# Patient Record
Sex: Female | Born: 1940
Health system: Southern US, Community
[De-identification: ages and names within clinical notes are randomized; demographics above are authoritative.]

## PROBLEM LIST (undated history)

## (undated) DIAGNOSIS — I493 Ventricular premature depolarization: Secondary | ICD-10-CM

## (undated) DIAGNOSIS — R519 Headache, unspecified: Secondary | ICD-10-CM

## (undated) DIAGNOSIS — D649 Anemia, unspecified: Secondary | ICD-10-CM

## (undated) DIAGNOSIS — R112 Nausea with vomiting, unspecified: Secondary | ICD-10-CM

## (undated) DIAGNOSIS — Z8489 Family history of other specified conditions: Secondary | ICD-10-CM

## (undated) DIAGNOSIS — M1990 Unspecified osteoarthritis, unspecified site: Secondary | ICD-10-CM

## (undated) DIAGNOSIS — E785 Hyperlipidemia, unspecified: Secondary | ICD-10-CM

## (undated) DIAGNOSIS — K219 Gastro-esophageal reflux disease without esophagitis: Secondary | ICD-10-CM

## (undated) DIAGNOSIS — G709 Myoneural disorder, unspecified: Secondary | ICD-10-CM

## (undated) DIAGNOSIS — Z5189 Encounter for other specified aftercare: Secondary | ICD-10-CM

## (undated) DIAGNOSIS — E039 Hypothyroidism, unspecified: Secondary | ICD-10-CM

## (undated) DIAGNOSIS — N189 Chronic kidney disease, unspecified: Secondary | ICD-10-CM

## (undated) DIAGNOSIS — H269 Unspecified cataract: Secondary | ICD-10-CM

## (undated) DIAGNOSIS — L4050 Arthropathic psoriasis, unspecified: Secondary | ICD-10-CM

## (undated) DIAGNOSIS — R51 Headache: Secondary | ICD-10-CM

## (undated) DIAGNOSIS — J189 Pneumonia, unspecified organism: Secondary | ICD-10-CM

## (undated) DIAGNOSIS — K635 Polyp of colon: Secondary | ICD-10-CM

## (undated) DIAGNOSIS — E279 Disorder of adrenal gland, unspecified: Secondary | ICD-10-CM

## (undated) DIAGNOSIS — Z8669 Personal history of other diseases of the nervous system and sense organs: Secondary | ICD-10-CM

## (undated) DIAGNOSIS — Z9889 Other specified postprocedural states: Secondary | ICD-10-CM

## (undated) DIAGNOSIS — I499 Cardiac arrhythmia, unspecified: Secondary | ICD-10-CM

## (undated) DIAGNOSIS — K317 Polyp of stomach and duodenum: Secondary | ICD-10-CM

## (undated) HISTORY — PX: ABDOMINAL HYSTERECTOMY: SHX81

## (undated) HISTORY — DX: Polyp of stomach and duodenum: K31.7

## (undated) HISTORY — DX: Chronic kidney disease, unspecified: N18.9

## (undated) HISTORY — PX: FINGER SURGERY: SHX640

## (undated) HISTORY — PX: CATARACT EXTRACTION, BILATERAL: SHX1313

## (undated) HISTORY — DX: Hypothyroidism, unspecified: E03.9

## (undated) HISTORY — PX: CARDIAC CATHETERIZATION: SHX172

## (undated) HISTORY — DX: Hyperlipidemia, unspecified: E78.5

## (undated) HISTORY — DX: Encounter for other specified aftercare: Z51.89

## (undated) HISTORY — PX: CHOLECYSTECTOMY: SHX55

## (undated) HISTORY — DX: Unspecified osteoarthritis, unspecified site: M19.90

## (undated) HISTORY — DX: Gastro-esophageal reflux disease without esophagitis: K21.9

## (undated) HISTORY — DX: Arthropathic psoriasis, unspecified: L40.50

## (undated) HISTORY — PX: RHINOPLASTY: SUR1284

## (undated) HISTORY — PX: INCONTINENCE SURGERY: SHX676

## (undated) HISTORY — PX: TONSILLECTOMY: SUR1361

## (undated) HISTORY — DX: Polyp of colon: K63.5

## (undated) HISTORY — PX: RECTOCELE REPAIR: SHX761

## (undated) HISTORY — PX: APPENDECTOMY: SHX54

## (undated) HISTORY — DX: Disorder of adrenal gland, unspecified: E27.9

## (undated) HISTORY — DX: Unspecified cataract: H26.9

## (undated) HISTORY — DX: Ventricular premature depolarization: I49.3

## (undated) HISTORY — PX: COLONOSCOPY: SHX174

## (undated) HISTORY — DX: Myoneural disorder, unspecified: G70.9

## (undated) HISTORY — DX: Personal history of other diseases of the nervous system and sense organs: Z86.69

---

## 1976-05-24 DIAGNOSIS — Z5189 Encounter for other specified aftercare: Secondary | ICD-10-CM

## 1976-05-24 HISTORY — DX: Encounter for other specified aftercare: Z51.89

## 2001-05-23 ENCOUNTER — Other Ambulatory Visit: Admission: RE | Admit: 2001-05-23 | Discharge: 2001-05-23 | Payer: Self-pay | Admitting: Internal Medicine

## 2001-05-29 ENCOUNTER — Encounter: Payer: Self-pay | Admitting: Internal Medicine

## 2001-05-29 ENCOUNTER — Encounter: Admission: RE | Admit: 2001-05-29 | Discharge: 2001-05-29 | Payer: Self-pay | Admitting: Internal Medicine

## 2002-02-12 ENCOUNTER — Emergency Department (HOSPITAL_COMMUNITY): Admission: EM | Admit: 2002-02-12 | Discharge: 2002-02-13 | Payer: Self-pay | Admitting: *Deleted

## 2002-02-13 ENCOUNTER — Encounter: Payer: Self-pay | Admitting: Emergency Medicine

## 2002-04-03 ENCOUNTER — Encounter: Admission: RE | Admit: 2002-04-03 | Discharge: 2002-04-03 | Payer: Self-pay | Admitting: *Deleted

## 2002-04-03 ENCOUNTER — Encounter: Payer: Self-pay | Admitting: *Deleted

## 2002-08-09 ENCOUNTER — Other Ambulatory Visit: Admission: RE | Admit: 2002-08-09 | Discharge: 2002-08-09 | Payer: Self-pay | Admitting: Obstetrics and Gynecology

## 2003-04-25 ENCOUNTER — Encounter (INDEPENDENT_AMBULATORY_CARE_PROVIDER_SITE_OTHER): Payer: Self-pay | Admitting: *Deleted

## 2003-04-25 ENCOUNTER — Ambulatory Visit (HOSPITAL_COMMUNITY): Admission: RE | Admit: 2003-04-25 | Discharge: 2003-04-25 | Payer: Self-pay | Admitting: Internal Medicine

## 2003-08-20 ENCOUNTER — Other Ambulatory Visit: Admission: RE | Admit: 2003-08-20 | Discharge: 2003-08-20 | Payer: Self-pay | Admitting: Obstetrics and Gynecology

## 2003-12-13 ENCOUNTER — Inpatient Hospital Stay (HOSPITAL_COMMUNITY): Admission: RE | Admit: 2003-12-13 | Discharge: 2003-12-14 | Payer: Self-pay | Admitting: Obstetrics and Gynecology

## 2004-06-03 ENCOUNTER — Ambulatory Visit: Payer: Self-pay | Admitting: Internal Medicine

## 2004-06-09 ENCOUNTER — Ambulatory Visit: Payer: Self-pay | Admitting: Internal Medicine

## 2004-09-11 ENCOUNTER — Other Ambulatory Visit: Admission: RE | Admit: 2004-09-11 | Discharge: 2004-09-11 | Payer: Self-pay | Admitting: Obstetrics and Gynecology

## 2005-09-02 ENCOUNTER — Ambulatory Visit (HOSPITAL_BASED_OUTPATIENT_CLINIC_OR_DEPARTMENT_OTHER): Admission: RE | Admit: 2005-09-02 | Discharge: 2005-09-02 | Payer: Self-pay | Admitting: Orthopedic Surgery

## 2005-09-02 ENCOUNTER — Encounter (INDEPENDENT_AMBULATORY_CARE_PROVIDER_SITE_OTHER): Payer: Self-pay | Admitting: Specialist

## 2005-12-01 ENCOUNTER — Encounter: Payer: Self-pay | Admitting: Gastroenterology

## 2006-07-22 ENCOUNTER — Emergency Department (HOSPITAL_COMMUNITY): Admission: EM | Admit: 2006-07-22 | Discharge: 2006-07-23 | Payer: Self-pay | Admitting: Emergency Medicine

## 2007-09-06 ENCOUNTER — Ambulatory Visit (HOSPITAL_COMMUNITY): Admission: RE | Admit: 2007-09-06 | Discharge: 2007-09-06 | Payer: Self-pay | Admitting: Family Medicine

## 2007-09-06 ENCOUNTER — Encounter (INDEPENDENT_AMBULATORY_CARE_PROVIDER_SITE_OTHER): Payer: Self-pay | Admitting: *Deleted

## 2007-10-25 ENCOUNTER — Ambulatory Visit (HOSPITAL_COMMUNITY): Admission: RE | Admit: 2007-10-25 | Discharge: 2007-10-25 | Payer: Self-pay | Admitting: Gastroenterology

## 2007-10-25 ENCOUNTER — Encounter (INDEPENDENT_AMBULATORY_CARE_PROVIDER_SITE_OTHER): Payer: Self-pay | Admitting: *Deleted

## 2007-10-25 ENCOUNTER — Encounter: Payer: Self-pay | Admitting: Gastroenterology

## 2008-01-23 ENCOUNTER — Encounter: Payer: Self-pay | Admitting: Gastroenterology

## 2008-04-25 ENCOUNTER — Encounter: Payer: Self-pay | Admitting: Gastroenterology

## 2008-07-02 ENCOUNTER — Ambulatory Visit: Payer: Self-pay | Admitting: Psychology

## 2009-03-19 ENCOUNTER — Ambulatory Visit: Payer: Self-pay | Admitting: Gastroenterology

## 2009-04-29 ENCOUNTER — Ambulatory Visit: Payer: Self-pay | Admitting: Gastroenterology

## 2009-05-05 ENCOUNTER — Encounter: Payer: Self-pay | Admitting: Gastroenterology

## 2009-05-31 ENCOUNTER — Encounter: Payer: Self-pay | Admitting: Gastroenterology

## 2009-06-10 ENCOUNTER — Ambulatory Visit: Payer: Self-pay | Admitting: Gastroenterology

## 2009-06-10 DIAGNOSIS — K3189 Other diseases of stomach and duodenum: Secondary | ICD-10-CM | POA: Insufficient documentation

## 2009-06-10 DIAGNOSIS — R1013 Epigastric pain: Secondary | ICD-10-CM

## 2009-06-13 ENCOUNTER — Ambulatory Visit: Payer: Self-pay | Admitting: Gastroenterology

## 2009-06-20 ENCOUNTER — Encounter: Payer: Self-pay | Admitting: Gastroenterology

## 2010-06-23 NOTE — Procedures (Signed)
Summary: [facility]   Imported By: Lester [place] 05/28/2009 11:03:13  _____________________________________________________________________  External Attachment:    Type:   Image     Comment:   External Document

## 2010-06-23 NOTE — Letter (Signed)
Summary: EGD Instructions  [facility]  [address] [place], [place] [address]   Phone: [phone]  Fax: [phone]       LYNNITA SOMMA    Jul 13, 1940    MRN: [id number]       Procedure Day /Date:06/13/09     Arrival Time: 3 pm     Procedure Time:4 pm     Location of Procedure:                    [facility] (4th Floor)    PREPARATION FOR ENDOSCOPY   On1/21/11 THE DAY OF THE PROCEDURE:  1.   No solid foods, milk or milk products are allowed after midnight the night before your procedure.  2.   Do not drink anything colored red or purple.  Avoid juices with pulp.  No orange juice.  3.  You may drink clear liquids until 2 pm, which is 2 hours before your procedure.                                                                                                CLEAR LIQUIDS INCLUDE: Water Jello Ice Popsicles Tea (sugar ok, no milk/cream) Powdered fruit flavored drinks Coffee (sugar ok, no milk/cream) Gatorade Juice: apple, white grape, white cranberry  Lemonade Clear bullion, consomm, broth Carbonated beverages (any kind) Strained chicken noodle soup Hard Candy   MEDICATION INSTRUCTIONS  Unless otherwise instructed, you should take regular prescription medications with a small sip of water as early as possible the morning of your procedure.               OTHER INSTRUCTIONS  You will need a responsible adult at least 68 years of age to accompany you and drive you home.   This person must remain in the waiting room during your procedure.  Wear loose fitting clothing that is easily removed.  Leave jewelry and other valuables at home.  However, you may wish to bring a book to read or an iPod/MP3 player to listen to music as you wait for your procedure to start.  Remove all body piercing jewelry and leave at home.  Total time from sign-in until discharge is approximately 2-3 hours.  You should go home directly after your  procedure and rest.  You can resume normal activities the day after your procedure.  The day of your procedure you should not:   Drive   Make legal decisions   Operate machinery   Drink alcohol   Return to work  You will receive specific instructions about eating, activities and medications before you leave.    The above instructions have been reviewed and explained to me by   _______________________    I fully understand and can verbalize these instructions _____________________________ Date _________

## 2010-06-23 NOTE — Letter (Signed)
Summary: [facility]   Imported By: Lester [place] 05/28/2009 11:04:45  _____________________________________________________________________  External Attachment:    Type:   Image     Comment:   External Document

## 2010-06-23 NOTE — Assessment & Plan Note (Signed)
Review of gastrointestinal problems: 1. GERD; she has been seen by doctors [place] and [place] and had several EGDs. EGD/path report from 01/2008 EGD with Dr. Dion Body (was referred by Dr. Charna Elizabeth to see him).  Normal esophagus, several (fundic gland on path) gastric polyps, normal duodenum. also MBS study from 05/2008 at [facility]; normal 2. history of polyps,  was told by Dr. Loreta Ave to have repeat colonoscopy around now (2010) however previous colonoscopies have been requested not received by Dr. Kenna Gilbert office; colonoscopy report from Dr. Kenna Gilbert office: 11/2005 small polyp removed, was HP on pathology.   she needs repeat colonoscopy at 10 year interval, that would be 11/2010 (5 year interval for FH of colon cancer). 3. FH of colon cancer, mother   History of Present Illness Visit Type: follow up  Primary GI MD: Rob Bunting MD Primary Provider: Merri Brunette, MD Requesting Provider: n/a Chief Complaint: F/u visit  History of Present Illness:      very pleasant 70 year old woman who since her last visit, her insurance would not pay for nexium; spoke with pharmacist and she tried prilosec OTC (taking it once a day) and this helped very well.    She switched the prilosec for nexium when her prescription was ready (caused belching, bloating, heaviness in abd).  Would wake up with headaches.  She stuck with the nexium for another 2 weeks, but finally stopped it 3-4 days ago.  She felt better after stopping the nexium PM dose.    still waking up with upper back pains, that she really feels are related to GERD.             Current Medications (verified): 1)  Maxzide 75-50 Mg Tabs (Triamterene-Hctz) .Marland Kitchen.. 1 By Mouth Once Daily As Needed 2)  Fish Oil 1000 Mg Caps (Omega-3 Fatty Acids) .... As Directed 3)  Climara 0.1 Mg/24hr Ptwk (Estradiol) .Marland Kitchen.. 1 Patch Weekly To Clean Dry Skin 4)  Vitamin D 1000 Unit Tabs (Cholecalciferol) .Marland Kitchen.. 1 By Mouth Once Daily 5)  Magnesium 250 Mg Tabs (Magnesium)  .... Take One Tablet By Mouth Two Times A Day 6)  Icaps Mv  Tabs (Multiple Vitamins-Minerals) .... As Directed 7)  Crestor 5 Mg Tabs (Rosuvastatin Calcium) .Marland Kitchen.. 1 By Mouth Once Daily 8)  Synthroid 50 Mcg Tabs (Levothyroxine Sodium) .Marland Kitchen.. 1 By Mouth Every Morning On A Empty Stomach 9)  Butalbital-Apap-Caff-Cod 50-325-40-30 Mg Caps (Butalbital-Apap-Caff-Cod) .Marland Kitchen.. 1 Cap As Needed Every 6-8 Hours As Needed Ha 10)  Nexium 20 Mg Cpdr (Esomeprazole Magnesium) .... Take One Pill Twice Daily (20-30 Min Before Bf and Dinner)  Allergies (verified): No Known Drug Allergies  Vital Signs:  Patient profile:   70 year old female Height:      65.5 inches Weight:      134 pounds BMI:     22.04 BSA:     1.68 Pulse rate:   62 / minute Pulse rhythm:   regular BP sitting:   108 / 74  (left arm) Cuff size:   regular  Vitals Entered By: Ok Anis CMA (June 10, 2009 3:35 PM)  Physical Exam  Additional Exam:  Constitutional: generally well appearing Psychiatric: alert and oriented times 3 Abdomen: soft, non-tender, non-distended, normal bowel sounds    Impression & Recommendations:  Problem # 1:  Dyspepsia, family history of esophagus cancer she is admittedly very nervous that her dyspeptic symptoms are related to esophagus cancer. I think that is unlikely however she is somewhat fixated on this and I do think  that if we could reassure her that it is not case that her dyspeptic symptoms may be either completely alleviated or at least easier to tolerate for her. We will therefore set her up for EGD at her soonest convenience. She may try to taper off Nexium to as needed or at least once every other day following that procedure.  Patient Instructions: 1)  You will be scheduled to have an upper endoscopy. 2)  Continue nexium once daily for now. 3)  A copy of this information will be sent to Dr. Merri Brunette. 4)  The medication list was reviewed and reconciled.  All changed / newly prescribed  medications were explained.  A complete medication list was provided to the patient / caregiver.  Appended Document: Orders Update/EGD    Clinical Lists Changes  Problems: Added new problem of DYSPEPSIA (ICD-536.8) Orders: Added new Test order of EGD (EGD) - Signed

## 2010-06-23 NOTE — Procedures (Signed)
Summary: Upper Endoscopy  Patient: [name] Note: All result statuses are Final unless otherwise noted.  Tests: (1) Upper Endoscopy (EGD)   EGD Upper Endoscopy       DONE     [facility]     [address]     [place], [place]  [address]           ENDOSCOPY PROCEDURE REPORT           PATIENT:  [name], [name]  MR#:  [id number]     BIRTHDATE:  04/20/1941, 68 yrs. old  GENDER:  female           ENDOSCOPIST:  Rachael Fee, MD           PROCEDURE DATE:  06/13/2009     PROCEDURE:  EGD with biopsy     ASA CLASS:  Class II     INDICATIONS:  dyspepsia, family history of UGI cancer           MEDICATIONS:  Fentanyl 50 mcg IV, Versed 7 mg IV     TOPICAL ANESTHETIC:  Exactacain Spray           DESCRIPTION OF PROCEDURE:   After the risks benefits and     alternatives of the procedure were thoroughly explained, informed     consent was obtained.  The LB GIF-H180 [id number] endoscope was     introduced through the mouth and advanced to the second portion of     the duodenum, without limitations.  The instrument was slowly     withdrawn as the mucosa was fully examined.     <<PROCEDUREIMAGES>>           There was mild, non-specific pangastritis. Biopsies were taken to     look for H. pylori (see image5 and image6).  Otherwise the     examination was normal (see image3, image4, image7, and image8).     Retroflexed views revealed no abnormalities.    The scope was then     withdrawn from the patient and the procedure completed.           COMPLICATIONS:  None           ENDOSCOPIC IMPRESSION:     1) Mild, non-specific gastritis;  biopsies taken to check for H.     pylori     2) Otherwise normal examination; no hiatal hernia, no     esophagitis, no ulcers           RECOMMENDATIONS:     If biopsies show H. pylori, she will be started on the     appropriate antibiotics.           ______________________________     Rachael Fee, MD           cc: Cheryl Brunette, MD       n.     [name]:   Rachael Fee at 06/13/2009 04:04 PM           [name], [name], [id number]  Note: An exclamation mark (!) indicates a result that was not dispersed into the flowsheet. Document Creation Date: 06/13/2009 4:05 PM _______________________________________________________________________  (1) Order result status: Final Collection or observation date-time: 06/13/2009 15:56 Requested date-time:  Receipt date-time:  Reported date-time:  Referring Physician:   Ordering Physician: Rob Bunting [id number]) Specimen Source:  Source: Launa Grill Order Number: [id number] Lab site:

## 2010-06-23 NOTE — Letter (Signed)
Summary: Results Letter  [facility]  [address] [place], [place] [address]   Phone: [phone]  Fax: [phone]        June 20, 2009 MRN: [id number]    [facility] [address] [place], [place]  [address]    Dear Ms. Willcox,   The biopsies taken during your recent procedure upper endoscopy show no sign of infection, inflammation or cancer.  You should continue to follow the recommnedations that we discussed at the time of your procedure.  Please feel free to call if you have any further questions or concerns.       Sincerely,  Rachael Fee MD  This letter has been electronically signed by your physician.  Appended Document: Results Letter Letter mailed 1.31.11

## 2010-06-23 NOTE — Procedures (Signed)
Summary: Colon/[facility]  Colon/[facility]   Imported By: Lester [place] 05/28/2009 11:00:43  _____________________________________________________________________  External Attachment:    Type:   Image     Comment:   External Document

## 2010-10-09 NOTE — Op Note (Signed)
NAME:  [name], [name]                         ACCOUNT NO.:  [id number]   MEDICAL RECORD NO.:  [id number]                   PATIENT TYPE:  AMB   LOCATION:  DAY                                  FACILITY:  [facility]   PHYSICIAN:  Michelle L. Vincente Poli, M.D.            DATE OF BIRTH:  [birth year]   DATE OF PROCEDURE:  12/12/2003  DATE OF DISCHARGE:                                 OPERATIVE REPORT   PREOPERATIVE DIAGNOSES:  1. Urinary incontinence.  2. Cystocele and rectocele.   POSTOPERATIVE DIAGNOSES:  1. Urinary incontinence.  2. Cystocele and rectocele.   PROCEDURE:  Anterior and posterior repair.   SURGEON:  Dr. Vincente Poli   ANESTHESIA:  General.   ESTIMATED BLOOD LOSS:  Less than 50 mL.   DESCRIPTION OF PROCEDURE:  The patient was initially taken to the operating  room by Dr. Patsi Sears.  She was intubated, and Dr. Patsi Sears proceeded  with his obturator sling, and that portion of the surgery will be dictated  by him.  At the conclusion of the cystoscopy and placement of the obturator  sling, I then scrubbed in.  The patient was already prepped and draped, and  a Foley catheter was already in the bladder when I scrubbed in.  On exam,  the patient was noted to have a grade 2 cystocele and a grade 2-3 rectocele.  A weighted speculum was already in the vagina.  Allis clamps were placed at  the vaginal apex on either side, and a transverse incision was made with a  scalpel.  The overlying vaginal epithelium was then dissected free from the  vesicovaginal fascia, and a midline incision was made in the anterior  vaginal wall beneath the cystocele.  Cystocele was reduced using 0 Vicryl  pursestring sutures x 2.  The redundant vaginal epithelium was then trimmed,  and the vaginal epithelium was closed anteriorly in a continuous running  lock stitch using 2-0 Vicryl suture.  We then turned our attention  posteriorly to the rectocele.  The weighted speculum was removed, and the  rectocele was  inspected.  She actually had very good support of her vaginal  vault, and there was no enterocele on exam.  We then placed Allis clamps at  5 and 7o'clock, and a V-shaped incision was then made at the perineum with a  scalpel.  The vaginal epithelium was dissected free using Struhle scissors  from the rectovaginal fascia.  A midline incision was made from the  vestibule almost all the way up to the vaginal vault posteriorly.  We then  reduced the rectocele using sharp and blunt dissection, and the rectocele  was then closed using interrupted 0 Vicryl suture.  We then trimmed the  redundant vaginal epithelium tissue and then closed that using continuous  running lock stitch using 2-0 Vicryl suture.  At the end of the procedure,  there was no vaginal bleeding noted.  After adequate hemostasis was  ensured,  we then placed the vaginal packing  with Estrace cream into the vagina.  All sponge, lap, and instrument counts  were correct x 2.  The patient went to recovery room in stable condition.  Of note, the patient will be observed overnight on the floor and if stable,  will go home the following day.                                               Michelle L. Vincente Poli, M.D.    Florestine Avers  D:  12/12/2003  T:  12/12/2003  Job:  [id number]

## 2010-10-09 NOTE — H&P (Signed)
NAME:  [name], [name]                         ACCOUNT NO.:  [id number]   MEDICAL RECORD NO.:  [id number]                   PATIENT TYPE:  INP   LOCATION:  0463                                 FACILITY:  [facility]   PHYSICIAN:  Michelle L. Vincente Poli, M.D.            DATE OF BIRTH:  05/21/1941   DATE OF ADMISSION:  12/12/2003  DATE OF DISCHARGE:  12/14/2003                                HISTORY & PHYSICAL   HISTORY OF PRESENT ILLNESS:  The patient is a 70 year old female who is  admitted to undergo surgical repair of cystocele and rectocele. The patient  also has genuine urinary incontinence and Dr. Patsi Sears will be performing  a transobturator pelvic sling.  The patient complains of urinary  incontinence. She has also has a chronic problem with constipation. She also  complains of some pelvic pressure and some discomfort with intercourse.   PAST MEDICAL HISTORY:  Significant for a history of fibromyalgia, arthritis,  and hypothyroidism.   MEDICATIONS:  1. Synthroid 0.05 mg daily.  2. Vivelle-Dot patch 0.1 mg daily.  3. Transderm Fiorinal as needed for migraines.  4. Sudafed as needed for migraines.   ALLERGIES:  IMITREX and VIOXX.   PAST SURGICAL HISTORY:  Laparoscopic cholecystectomy in 1991; in 1978 she  had a hysterectomy and appendectomy; in 1961 rhinoplasty; in 1949  tonsillectomy and adenoidectomy; cystoscopy times two; proctoscopy and  sigmoidoscopy three years ago;  had LASIK surgery times two on the right eye  and one time on the left eye.   PHYSICAL EXAMINATION:  VITAL SIGNS: On admission, temperature 98.7, heart  rate 60, respirations 15, blood pressure 100/70. She weighs 132 pounds.  LUNGS: Clear to auscultation bilaterally.  CARDIAC: Regular rate and rhythm.  BREASTS: Soft, nontender with no masses.  PELVIC EXAM: She has a grade 3 cystocele and a grade 2-3 rectocele. She has  good vaginal vault support. She has no pelvic masses.   Labs on admission are significant  for a white count of 4.3, hemoglobin 15.4,  platelet count 182,000.   IMPRESSION:  Symptomatic cystocele and rectocele and genuine stress urinary  incontinence.   PLAN:  The patient is admitted and will undergo anterior and posterior  repair with pubovaginal sling.                                               Michelle L. Vincente Poli, M.D.    Florestine Avers  D:  01/15/2004  T:  01/15/2004  Job:  [id number]

## 2010-10-09 NOTE — Op Note (Signed)
NAME:  [name], [name]                         ACCOUNT NO.:  [id number]   MEDICAL RECORD NO.:  [id number]                   PATIENT TYPE:  OBV   LOCATION:  0098                                 FACILITY:  [facility]   PHYSICIAN:  Sigmund I. Patsi Sears, M.D.         DATE OF BIRTH:  September 08, 1940   DATE OF PROCEDURE:  12/12/2003  DATE OF DISCHARGE:                                 OPERATIVE REPORT   PREOPERATIVE DIAGNOSIS:  Urinary incontinence.   POSTOPERATIVE DIAGNOSIS:  Urinary incontinence.   OPERATION:  Transvaginal obturator pubovaginal sling.   SURGEON:  Sigmund I. Patsi Sears, M.D.   ANESTHESIA:  General LMA.   PREPARATION:  After appropriate preanesthesia, the patient was brought to  the operating room, placed upon the operating table in the dorsal supine  position where a general LMA anesthesia was introduced.  She was then  replaced in the Nazlini stirrups, in the dorsal lithotomy position, and the  pubis was prepped with Betadine solution and draped in the usual fashion.   PROCEDURE:  The periurethral space was injected with 10 mL of Marcaine 0.25  plain.  A 1.5 cm incision was then made directly over the urethra,  subcutaneous tissue was dissected in the periurethral sulcus bilaterally.  Care was taken to avoid any injury to the urethra.  A mark was placed 5 cm  lateral to the clitoris with a blue marking pen, and stab wounds were made  bilaterally.  Using the Mentor flat passer, the Mentor transobturator  pubovaginal sling tape was passed, tensioned appropriately.  Cystoscopy  revealed no evidence of any bladder trauma.  Foley catheter was replaced.  The wound was closed in two layers with 3-0 Vicryl suture, both interrupted  and running sutures.  The perivaginal incisions were closed with Dermabond.  The patient tolerated the procedure well.  She is to undergo posterior and  anterior repair per Dr. Vincente Poli.                                               Sigmund I. Patsi Sears,  M.D.    SIT/MEDQ  D:  12/12/2003  T:  12/12/2003  Job:  [id number]

## 2010-10-09 NOTE — Op Note (Signed)
[name], [name]               ACCOUNT NO.:  [id number]   MEDICAL RECORD NO.:  [id number]          PATIENT TYPE:  AMB   LOCATION:  DSC                          FACILITY:  [facility]   PHYSICIAN:  Cindee Salt, M.D.       DATE OF BIRTH:  09-08-1940   DATE OF PROCEDURE:  09/02/2005  DATE OF DISCHARGE:                                 OPERATIVE REPORT   PREOPERATIVE DIAGNOSIS:  Mass proximal interphalangeal joint, right middle  finger.   POSTOPERATIVE DIAGNOSIS:  Mass proximal interphalangeal joint, right middle  finger.   OPERATION:  Excision mass, debridement of proximal interphalangeal joint,  right middle finger.   SURGEON:  Cindee Salt, M.D.   ANESTHESIA:  Forearm based IV regional.   HISTORY:  The patient is a 70 year old female with a history of a large mass  just distal to the PIP joint right middle finger dorsal radial aspect.   PROCEDURE:  The patient is brought to the operating room where a forearm  based IV regional anesthetic was carried out without difficulty.  She was  prepped using DuraPrep, supine position, right arm free.  An oblique  incision from midlateral line to the dorsal midportion of the middle phalanx  was made, carried down through subcutaneous tissue.  A large cystic  structure was immediately apparent.  With blunt and sharp dissection this  was dissected free, found arise from the PIP joint.  The retinacular fibers  were incised just volar to the lateral band.  This allowed opening of the  PIP joint.  The stalk was followed into the PIP joint and debridement was  performed.  The cyst was sent to pathology and no further lesions were  identified.  The wound was copiously irrigated with saline.  The skin was  then closed with interrupted 5-0 nylon sutures.  A compressive dressing and  splint to the finger applied.  The patient tolerated the procedure well and  was taken to the recovery for observation in satisfactory condition.  She is  discharged home to  return to the [facility] in one week on  Vicodin.           ______________________________  Cindee Salt, M.D.     GK/MEDQ  D:  09/02/2005  T:  09/02/2005  Job:  [id number]

## 2011-09-10 ENCOUNTER — Telehealth: Payer: Self-pay | Admitting: Gastroenterology

## 2011-09-10 NOTE — Telephone Encounter (Signed)
lmom for pt to call back. I have sent for her chart because we show the recall in 2017, but on path report Dr Loreta Ave wrote a 5 year recall that would have been last year.

## 2011-09-13 NOTE — Telephone Encounter (Signed)
Thanks, Britta Mccreedy checked and chart has never been sent; I'll get it to you Informed pt we will call her back. She prefers procedure be done week of 11/12/11 to 11/19/11 if possible; she will go on Medicare after 11/19/11.

## 2011-09-13 NOTE — Telephone Encounter (Signed)
I reviewed her colonoscopy and the pathologic report. She had a hyperplastic polyp removed in 2007 during colonoscopy. Can you ask her about her family history, I think her mother may have had colon cancer and if that is true then she should undergo colonoscopy in the next few months.

## 2011-09-13 NOTE — Telephone Encounter (Signed)
i will need to see her 2007 colonoscopy report as well as path report from that procedure before deciding on proper surveillance interval.  Can you put those on my desk and? t hanks

## 2011-09-13 NOTE — Telephone Encounter (Signed)
Dr Christella Hartigan, I am still waiting on pt's chart, but per Dr Kenna Gilbert note on 2007, pt needed 5 year f/u COLON in 2012. She states she received a letter, but never got around to it; I don't see the letter in [facility] either. Anyway, do you want to see the chart or is it ok to schedule a Direct COLON? Thanks.

## 2011-09-14 NOTE — Telephone Encounter (Signed)
Yes, if her mother had colon cancer then she should have colonoscopy every 5 years (would be due now). thanks

## 2011-09-14 NOTE — Telephone Encounter (Signed)
Scheduled pt for procedure on 11/16/11 and PV on 11/12/11; mailed pt a copy of schedule after speaking with her.

## 2011-09-14 NOTE — Telephone Encounter (Signed)
Spoke with pt and she states her mother had Colon Cancer post Breast Cancer. She wants you to know she isn't pushing for a COLON, her PCP is wanting her to check on this. OK to schedule? Thanks.

## 2011-11-16 ENCOUNTER — Other Ambulatory Visit: Payer: Self-pay | Admitting: Gastroenterology

## 2012-02-14 ENCOUNTER — Encounter: Payer: Self-pay | Admitting: Gastroenterology

## 2012-03-10 ENCOUNTER — Ambulatory Visit (AMBULATORY_SURGERY_CENTER): Payer: Medicare Other | Admitting: *Deleted

## 2012-03-10 ENCOUNTER — Encounter: Payer: Self-pay | Admitting: Gastroenterology

## 2012-03-10 VITALS — Ht 66.0 in | Wt 133.9 lb

## 2012-03-10 DIAGNOSIS — Z1211 Encounter for screening for malignant neoplasm of colon: Secondary | ICD-10-CM

## 2012-03-10 DIAGNOSIS — Z800 Family history of malignant neoplasm of digestive organs: Secondary | ICD-10-CM

## 2012-03-10 MED ORDER — PEG-KCL-NACL-NASULF-NA ASC-C 100 G PO SOLR: ORAL | Status: DC

## 2012-03-24 ENCOUNTER — Other Ambulatory Visit: Payer: Self-pay

## 2012-03-24 ENCOUNTER — Ambulatory Visit (AMBULATORY_SURGERY_CENTER): Payer: Medicare Other | Admitting: Gastroenterology

## 2012-03-24 ENCOUNTER — Encounter: Payer: Self-pay | Admitting: Gastroenterology

## 2012-03-24 VITALS — BP 101/45 | HR 52 | Temp 97.3°F | Resp 10 | Ht 66.0 in | Wt 133.0 lb

## 2012-03-24 DIAGNOSIS — Z1211 Encounter for screening for malignant neoplasm of colon: Secondary | ICD-10-CM

## 2012-03-24 DIAGNOSIS — Z800 Family history of malignant neoplasm of digestive organs: Secondary | ICD-10-CM

## 2012-03-24 DIAGNOSIS — Q438 Other specified congenital malformations of intestine: Secondary | ICD-10-CM

## 2012-03-24 MED ORDER — SODIUM CHLORIDE 0.9 % IV SOLN: 500.0000 mL | INTRAVENOUS | Status: DC

## 2012-03-24 NOTE — Patient Instructions (Signed)
Incomplete exam today. Office will call you with appointment for Virtual colonoscopy. This is done by X-Ray. Call us with any questions or concerns. Resume current medications. Thank you!!  YOU HAD AN ENDOSCOPIC PROCEDURE TODAY AT THE [facility]: Refer to the procedure report that was given to you for any specific questions about what was found during the examination.  If the procedure report does not answer your questions, please call your gastroenterologist to clarify.  If you requested that your care partner not be given the details of your procedure findings, then the procedure report has been included in a sealed envelope for you to review at your convenience later.  YOU SHOULD EXPECT: Some feelings of bloating in the abdomen. Passage of more gas than usual.  Walking can help get rid of the air that was put into your GI tract during the procedure and reduce the bloating. If you had a lower endoscopy (such as a colonoscopy or flexible sigmoidoscopy) you may notice spotting of blood in your stool or on the toilet paper. If you underwent a bowel prep for your procedure, then you may not have a normal bowel movement for a few days.  DIET: Your first meal following the procedure should be a light meal and then it is ok to progress to your normal diet.  A half-sandwich or bowl of soup is an example of a good first meal.  Heavy or fried foods are harder to digest and may make you feel nauseous or bloated.  Likewise meals heavy in dairy and vegetables can cause extra gas to form and this can also increase the bloating.  Drink plenty of fluids but you should avoid alcoholic beverages for 24 hours.  ACTIVITY: Your care partner should take you home directly after the procedure.  You should plan to take it easy, moving slowly for the rest of the day.  You can resume normal activity the day after the procedure however you should NOT DRIVE or use heavy machinery for 24 hours (because of the sedation  medicines used during the test).    SYMPTOMS TO REPORT IMMEDIATELY: A gastroenterologist can be reached at any hour.  During normal business hours, 8:30 AM to 5:00 PM Monday through Friday, call [phone].  After hours and on weekends, please call the GI answering service at [phone] who will take a message and have the physician on call contact you.   Following lower endoscopy (colonoscopy or flexible sigmoidoscopy):  Excessive amounts of blood in the stool  Significant tenderness or worsening of abdominal pains  Swelling of the abdomen that is new, acute  Fever of 100F or higher  FOLLOW UP: If any biopsies were taken you will be contacted by phone or by letter within the next 1-3 weeks.  Call your gastroenterologist if you have not heard about the biopsies in 3 weeks.  Our staff will call the home number listed on your records the next business day following your procedure to check on you and address any questions or concerns that you may have at that time regarding the information given to you following your procedure. This is a courtesy call and so if there is no answer at the home number and we have not heard from you through the emergency physician on call, we will assume that you have returned to your regular daily activities without incident.  SIGNATURES/CONFIDENTIALITY: You and/or your care partner have signed paperwork which will be entered into your electronic medical record.  These signatures attest to the fact that that the information above on your After Visit Summary has been reviewed and is understood.  Full responsibility of the confidentiality of this discharge information lies with you and/or your care-partner.  

## 2012-03-24 NOTE — Op Note (Signed)
[facility] [address] [place], [address]   COLONOSCOPY PROCEDURE REPORT  PATIENT: Cala, Cabera  MR#: [id number] BIRTHDATE: March 13, 1941 , 71  yrs. old GENDER: Female ENDOSCOPIST: Rachael Fee, MD PROCEDURE DATE:  03/24/2012 PROCEDURE:   Colonoscopy, incomplete ASA CLASS:   Class II INDICATIONS:  mother had colon cancer, patient had hyperplastic polyp 2007 Dr.  Loreta Ave colonoscopy. MEDICATIONS: Fentanyl 100 mcg IV, Versed 10 mg IV, and These medications were titrated to patient response per physician's verbal order  DESCRIPTION OF PROCEDURE:   After the risks benefits and alternatives of the procedure were thoroughly explained, informed consent was obtained.  A digital rectal exam revealed no abnormalities of the rectum.   The LB [id number]  endoscope was introduced through the anus and advanced to the mid transverse colon. No adverse events experienced.   The quality of the prep was good, using MoviPrep  The instrument was then slowly withdrawn as the colon was fully examined.  COLON FINDINGS: There colon was very tortuous and there was significant "looping" of the colonoscopy which felt to be in pelvis.  Despite abdominal pressure, several attempts to complete the examination to the cecum were unsuccessful.  Retroflexed views revealed no abnormalities. The time to cecum=minutes 0 seconds. Withdrawal time=minutes 0 seconds.  The scope was withdrawn and the procedure completed. COMPLICATIONS: There were no complications.  ENDOSCOPIC IMPRESSION: This was an INCOMPLETE examination due to signficantly tortuous colon causing more than usual looping of colonoscope.  RECOMMENDATIONS: My office will get in touch to arrange CT Colonography ("virtual colonsocopy").   eSigned:  Rachael Fee, MD 03/24/2012 11:11 AM     cc: Severiano Gilbert, MD

## 2012-03-24 NOTE — Progress Notes (Signed)
Referral made GI to call pt

## 2012-03-24 NOTE — Progress Notes (Signed)
Unable to reach cecum due to very twisted colon.   Patient in a lot of pain although asleep.  Dr. Christella Hartigan aborted the procedure at this time.  Patient stable throughout.

## 2012-03-24 NOTE — Progress Notes (Signed)
Patient did not experience any of the following events: a burn prior to discharge; a fall within the facility; wrong site/side/patient/procedure/implant event; or a hospital transfer or hospital admission upon discharge from the facility. (G8907) Patient did not have preoperative order for IV antibiotic SSI prophylaxis. (G8918)  

## 2012-03-27 ENCOUNTER — Telehealth: Payer: Self-pay

## 2012-03-27 NOTE — Telephone Encounter (Signed)
  Follow up Call-  Call back number 03/24/2012  Post procedure Call Back phone  # [phone]  Permission to leave phone message Yes     Patient questions:  Do you have a fever, pain , or abdominal swelling? no Pain Score  0 *  Have you tolerated food without any problems? yes  Have you been able to return to your normal activities? yes  Do you have any questions about your discharge instructions: Diet   no Medications  no Follow up visit  no  Do you have questions or concerns about your Care? no  Actions: * If pain score is 4 or above: No action needed, pain <4.

## 2012-04-17 ENCOUNTER — Ambulatory Visit
Admission: RE | Admit: 2012-04-17 | Discharge: 2012-04-17 | Disposition: A | Discharge status: Home / Self Care | Payer: Medicare Other | Source: Ambulatory Visit | Attending: Gastroenterology | Admitting: Gastroenterology

## 2012-04-17 DIAGNOSIS — Q438 Other specified congenital malformations of intestine: Secondary | ICD-10-CM

## 2012-04-25 ENCOUNTER — Other Ambulatory Visit: Payer: Self-pay

## 2012-04-25 DIAGNOSIS — R932 Abnormal findings on diagnostic imaging of liver and biliary tract: Secondary | ICD-10-CM

## 2012-04-28 ENCOUNTER — Ambulatory Visit (HOSPITAL_COMMUNITY)
Admission: RE | Admit: 2012-04-28 | Discharge: 2012-04-28 | Disposition: A | Discharge status: Home / Self Care | Payer: Medicare Other | Source: Ambulatory Visit | Attending: Gastroenterology | Admitting: Gastroenterology

## 2012-04-28 DIAGNOSIS — R932 Abnormal findings on diagnostic imaging of liver and biliary tract: Secondary | ICD-10-CM

## 2012-04-28 LAB — CREATININE, SERUM: Creatinine, Ser: 0.77 mg/dL (ref 0.50–1.10)

## 2012-04-28 MED ORDER — GADOBENATE DIMEGLUMINE 529 MG/ML IV SOLN
15.0000 mL | Freq: Once | INTRAVENOUS | Status: AC | PRN
  Administered 2012-04-28: 12 mL via INTRAVENOUS

## 2012-05-03 ENCOUNTER — Other Ambulatory Visit: Payer: Self-pay | Admitting: Obstetrics and Gynecology

## 2012-07-18 ENCOUNTER — Emergency Department (HOSPITAL_COMMUNITY)
Admission: EM | Admit: 2012-07-18 | Discharge: 2012-07-18 | Disposition: A | Discharge status: Home / Self Care | Payer: Medicare PPO | Attending: Emergency Medicine | Admitting: Emergency Medicine

## 2012-07-18 ENCOUNTER — Encounter (HOSPITAL_COMMUNITY): Payer: Self-pay | Admitting: Emergency Medicine

## 2012-07-18 ENCOUNTER — Emergency Department (HOSPITAL_COMMUNITY): Payer: Medicare PPO

## 2012-07-18 DIAGNOSIS — K219 Gastro-esophageal reflux disease without esophagitis: Secondary | ICD-10-CM | POA: Insufficient documentation

## 2012-07-18 DIAGNOSIS — E785 Hyperlipidemia, unspecified: Secondary | ICD-10-CM | POA: Insufficient documentation

## 2012-07-18 DIAGNOSIS — Z79899 Other long term (current) drug therapy: Secondary | ICD-10-CM | POA: Insufficient documentation

## 2012-07-18 DIAGNOSIS — R112 Nausea with vomiting, unspecified: Secondary | ICD-10-CM | POA: Insufficient documentation

## 2012-07-18 DIAGNOSIS — N189 Chronic kidney disease, unspecified: Secondary | ICD-10-CM | POA: Insufficient documentation

## 2012-07-18 DIAGNOSIS — Z8669 Personal history of other diseases of the nervous system and sense organs: Secondary | ICD-10-CM | POA: Insufficient documentation

## 2012-07-18 DIAGNOSIS — Z8739 Personal history of other diseases of the musculoskeletal system and connective tissue: Secondary | ICD-10-CM | POA: Insufficient documentation

## 2012-07-18 DIAGNOSIS — R109 Unspecified abdominal pain: Secondary | ICD-10-CM

## 2012-07-18 DIAGNOSIS — R1032 Left lower quadrant pain: Secondary | ICD-10-CM | POA: Insufficient documentation

## 2012-07-18 LAB — URINALYSIS, ROUTINE W REFLEX MICROSCOPIC
Bilirubin Urine: NEGATIVE
Hgb urine dipstick: NEGATIVE
Specific Gravity, Urine: 1.02 (ref 1.005–1.030)
Urobilinogen, UA: 0.2 mg/dL (ref 0.0–1.0)
pH: 8 (ref 5.0–8.0)

## 2012-07-18 LAB — CBC WITH DIFFERENTIAL/PLATELET
HCT: 39 % (ref 36.0–46.0)
Hemoglobin: 14 g/dL (ref 12.0–15.0)
Lymphocytes Relative: 4 % — ABNORMAL LOW (ref 12–46)
Lymphs Abs: 0.4 10*3/uL — ABNORMAL LOW (ref 0.7–4.0)
MCHC: 35.9 g/dL (ref 30.0–36.0)
Monocytes Absolute: 0.2 10*3/uL (ref 0.1–1.0)
Monocytes Relative: 2 % — ABNORMAL LOW (ref 3–12)
Neutro Abs: 9.5 10*3/uL — ABNORMAL HIGH (ref 1.7–7.7)
Neutrophils Relative %: 94 % — ABNORMAL HIGH (ref 43–77)
RBC: 4.31 MIL/uL (ref 3.87–5.11)
WBC: 10.1 10*3/uL (ref 4.0–10.5)

## 2012-07-18 LAB — LIPASE, BLOOD: Lipase: 29 U/L (ref 11–59)

## 2012-07-18 LAB — COMPREHENSIVE METABOLIC PANEL
ALT: 15 U/L (ref 0–35)
Calcium: 8.9 mg/dL (ref 8.4–10.5)
Creatinine, Ser: 0.71 mg/dL (ref 0.50–1.10)
GFR calc Af Amer: >90 mL/min (ref >90)
GFR calc non Af Amer: 86 mL/min — ABNORMAL LOW (ref >90)
Glucose, Bld: 146 mg/dL — ABNORMAL HIGH (ref 70–99)
Sodium: 138 mEq/L (ref 135–145)
Total Protein: 6.3 g/dL (ref 6.0–8.3)

## 2012-07-18 MED ORDER — IOHEXOL 300 MG/ML  SOLN
25.0000 mL | INTRAMUSCULAR | Status: AC
  Administered 2012-07-18: 25 mL via ORAL

## 2012-07-18 MED ORDER — ONDANSETRON HCL 4 MG/2ML IJ SOLN
4.0000 mg | Freq: Once | INTRAMUSCULAR | Status: AC
  Administered 2012-07-18: 4 mg via INTRAVENOUS
  Filled 2012-07-18: qty 2

## 2012-07-18 MED ORDER — IOHEXOL 300 MG/ML  SOLN
100.0000 mL | Freq: Once | INTRAMUSCULAR | Status: AC | PRN
  Administered 2012-07-18: 100 mL via INTRAVENOUS

## 2012-07-18 MED ORDER — SODIUM CHLORIDE 0.9 % IV BOLUS (SEPSIS)
1000.0000 mL | Freq: Once | INTRAVENOUS | Status: AC
Start: 2012-07-18 — End: 2012-07-18
  Administered 2012-07-18: 1000 mL via INTRAVENOUS

## 2012-07-18 MED ORDER — KETOROLAC TROMETHAMINE 30 MG/ML IJ SOLN
30.0000 mg | Freq: Once | INTRAMUSCULAR | Status: AC
  Administered 2012-07-18: 30 mg via INTRAVENOUS
  Filled 2012-07-18: qty 1

## 2012-07-18 MED ORDER — KETOROLAC TROMETHAMINE 10 MG PO TABS: 10.0000 mg | ORAL_TABLET | Freq: Four times a day (QID) | ORAL | Status: DC | PRN

## 2012-07-18 MED ORDER — ONDANSETRON 4 MG PO TBDP: ORAL_TABLET | ORAL | Status: DC

## 2012-07-18 NOTE — ED Notes (Addendum)
Writhing in bed with pain left lower quad-- states pain is "11" on 1-10 scale-- had normal BM this morning, urinated ok this am--   Received Fentanyl 150mg  IV, Morphine 8mg  IV, Zofran 4mg  IV per EMS

## 2012-07-18 NOTE — ED Notes (Signed)
Sudden onset of severe left lower abd pain this am--

## 2012-07-18 NOTE — ED Notes (Signed)
Vomiting at present-- writhing in pain -- states pain is making her vomit

## 2012-07-18 NOTE — ED Provider Notes (Signed)
History  This chart was scribed for Loren Racer, MD by Shari Heritage, ED Scribe. The patient was seen in room CD07C/CD07C. Patient's care was started at 1332.   CSN: [id number]  Arrival date & time 07/18/12  1330   First MD Initiated Contact with Patient 07/18/12 1332      Chief Complaint  Patient presents with  . Abdominal Pain     Patient is a 72 y.o. female presenting with abdominal pain.  Abdominal Pain Pain location:  LLQ Pain radiates to:  Back Pain severity:  Severe Onset quality:  Gradual Duration:  3 hours Timing:  Constant Progression:  Worsening Chronicity:  New Relieved by:  Nothing Associated symptoms: nausea and vomiting   Associated symptoms: no dysuria, no fever and no hematuria      HPI Comments: [name] is a 72 y.o. female who presents to the Emergency Department complaining of severe, constant, left lower quadrant abdominal pain that radiates up to herr back onset 3 hours ago. Patient states that she was asymptomatic when she woke up this morning. She says that pain came gradually and has been worsening since then. Pain is worse with movement and while sitting upright. She says that she was exercising immediately prior to symptom onset. Patient denies a history of similar pain. There is associated nausea and vomiting. Patient's last BM was this morning and it was normal. Patient denies difficulty urinating, blood in stool, diarrhea, fever or chills. Patient has a surgical history of abdominal hysterectomy, cholecystectomy, appendectomy and colonoscopy. She was given Fentanyl 150 mg IV, morphine 8 mg IV, and Zofran 4 mg IV en route via EMS. Patient states a history of fibromyalgia, but she doesn't take any pain medicines.   Past Medical History  Diagnosis Date  . Arthritis   . Blood transfusion without reported diagnosis   . Cataract   . GERD (gastroesophageal reflux disease)   . Hyperlipidemia   . Chronic kidney disease     hx bladder  infections  . Neuromuscular disorder     chronic muscle pain- takes Progesterone  . Neuromuscular disease     fibromyalgia    Past Surgical History  Procedure Laterality Date  . Tonsillectomy    . Rhinoplasty    . Abdominal hysterectomy    . Appendectomy      with hysterectomy  . Cholecystectomy    . Hand surgery      middle finger right hand  . Rectocele repair    . Incontinence surgery    . Colonoscopy      Family History  Problem Relation Age of Onset  . Colon cancer Mother   . Esophageal cancer Father   . Stomach cancer Father   . Rectal cancer Neg Hx     History  Substance Use Topics  . Smoking status: Never Smoker   . Smokeless tobacco: Never Used  . Alcohol Use: Yes     Comment: drink small amts weekly    OB History   Grav Para Term Preterm Abortions TAB SAB Ect Mult Living                  Review of Systems  Constitutional: Negative for fever.  Gastrointestinal: Positive for nausea, vomiting and abdominal pain.  Genitourinary: Negative for dysuria, hematuria and difficulty urinating.  All other systems reviewed and are negative.    Allergies  Imitrex  Home Medications   Current Outpatient Rx  Name  Route  Sig  Dispense  Refill  .  CLIMARA 0.1 MG/24HR   Transdermal   Place 1 patch onto the skin once a week. monday         . CRESTOR 5 MG tablet   Oral   Take 5 mg by mouth daily.          . Multiple Vitamins-Minerals (ICAPS AREDS FORMULA PO)   Oral   Take 1 tablet by mouth daily.         . NON FORMULARY   Oral   Take 125 mg by mouth at bedtime. Progesterone SR 125 mg every night         . NON FORMULARY      Testosterone versa based cream 0.2%  Applies 0.5 mL at bedtime         . NON FORMULARY      Cardicare B 2 Capsules twice daily         . Omega-3 Fatty Acids (FISH OIL) 1000 MG CAPS   Oral   Take 1 capsule by mouth 2 (two) times daily.          Marland Kitchen SYNTHROID 50 MCG tablet   Oral   Take 50 mcg by mouth every  morning.          . triamterene-hydrochlorothiazide (MAXZIDE) 75-50 MG per tablet   Oral   Take 1 tablet by mouth daily as needed (for fluid buildup).          Marland Kitchen ketorolac (TORADOL) 10 MG tablet   Oral   Take 1 tablet (10 mg total) by mouth every 6 (six) hours as needed for pain.   20 tablet   0   . ondansetron (ZOFRAN ODT) 4 MG disintegrating tablet      4mg  ODT q4 hours prn nausea/vomit   4 tablet   0     Triage Vitals: BP 128/62  Pulse 80  Temp(Src) 97.3 F (36.3 C) (Oral)  SpO2 100%  Physical Exam  Constitutional: She is oriented to person, place, and time. She appears well-developed and well-nourished.  HENT:  Head: Normocephalic and atraumatic.  Eyes: Conjunctivae and EOM are normal. Pupils are equal, round, and reactive to light.  Neck: Normal range of motion. Neck supple.  Cardiovascular: Normal rate, regular rhythm and normal heart sounds.   Pulmonary/Chest: Effort normal and breath sounds normal.  Abdominal: Soft. There is tenderness in the left lower quadrant. There is no rebound and no guarding.  LLQ tenderness to palpation.  Genitourinary:  Mild left flank tenderness.  Musculoskeletal: Normal range of motion.  Moving all extremities without difficulty.  Neurological: She is alert and oriented to person, place, and time.  Skin: Skin is warm and dry.    ED Course  Procedures (including critical care time) DIAGNOSTIC STUDIES: Oxygen Saturation is 100% on room air, normal by my interpretation.    COORDINATION OF CARE: 1:58 PM- Will give Toradol, Zofran and IV fluids. Will order abdominal CT, CMP, CBC with diff, UA and lipase. Patient informed of current plan for treatment and evaluation and agrees with plan at this time.  6:12 PM- Feels better after Toradol.   6:54 PM- Patient stable for discharge.   Labs Reviewed  COMPREHENSIVE METABOLIC PANEL - Abnormal; Notable for the following:    Potassium 3.4 (*)    Glucose, Bld 146 (*)    GFR calc non  Af Amer 86 (*)    All other components within normal limits  CBC WITH DIFFERENTIAL - Abnormal; Notable for the following:    Platelets 149 (*)  Neutrophils Relative 94 (*)    Neutro Abs 9.5 (*)    Lymphocytes Relative 4 (*)    Lymphs Abs 0.4 (*)    Monocytes Relative 2 (*)    All other components within normal limits  URINALYSIS, ROUTINE W REFLEX MICROSCOPIC - Abnormal; Notable for the following:    Ketones, ur >80 (*)    All other components within normal limits  LIPASE, BLOOD    Ct Abdomen Pelvis W Contrast  07/18/2012  *RADIOLOGY REPORT*  Clinical Data: Severe left lower quadrant pain.  CT ABDOMEN AND PELVIS WITH CONTRAST  Technique:  Multidetector CT imaging of the abdomen and pelvis was performed following the standard protocol during bolus administration of intravenous contrast.  Contrast: OMNIPAQUE IOHEXOL 300 MG/ML  SOLN  Comparison: MRI on 04/28/2012 and noncontrast CT on 04/17/2012  Findings: 2 cm benign hemangioma in the posterior right hepatic lobe as confirmed on recent MRI is stable.  No other liver masses are identified.  Prior cholecystectomy noted.  The pancreas, spleen, and adrenal glands are normal in appearance.  Bilateral renal parapelvic cysts are again seen, however there is no evidence of renal mass or hydronephrosis.  No soft tissue masses or lymphadenopathy identified within the abdomen or pelvis.  Prior hysterectomy noted.  Adnexal regions are unremarkable in appearance.  No evidence of inflammatory process or abnormal fluid collections.  No evidence of bowel wall thickening, dilatation, or hernia.  IMPRESSION:  1.  No acute findings or other significant abnormality identified. 2.  Stable benign hemangioma in the posterior hepatic lobe.   Original Report Authenticated By: Myles Rosenthal, M.D.      1. Abdominal pain       MDM  I personally performed the services described in this documentation, which was scribed in my presence. The recorded information has  been reviewed and is accurate.    Loren Racer, MD 07/19/12 [phone]

## 2012-08-28 ENCOUNTER — Other Ambulatory Visit: Payer: Self-pay | Admitting: Dermatology

## 2012-09-06 ENCOUNTER — Other Ambulatory Visit: Payer: Self-pay | Admitting: Dermatology

## 2012-12-27 ENCOUNTER — Other Ambulatory Visit: Payer: Self-pay

## 2013-03-29 ENCOUNTER — Other Ambulatory Visit: Payer: Self-pay

## 2013-05-29 ENCOUNTER — Other Ambulatory Visit: Payer: Self-pay | Admitting: Obstetrics and Gynecology

## 2013-09-06 ENCOUNTER — Encounter: Payer: Self-pay | Admitting: *Deleted

## 2013-09-07 ENCOUNTER — Encounter: Payer: Self-pay | Admitting: *Deleted

## 2013-09-12 ENCOUNTER — Ambulatory Visit (INDEPENDENT_AMBULATORY_CARE_PROVIDER_SITE_OTHER): Payer: Medicare PPO | Admitting: Neurology

## 2013-09-12 ENCOUNTER — Other Ambulatory Visit: Payer: Self-pay | Admitting: Orthopedic Surgery

## 2013-09-12 ENCOUNTER — Encounter: Payer: Self-pay | Admitting: Neurology

## 2013-09-12 VITALS — BP 126/78 | HR 56 | Resp 17 | Ht 65.5 in | Wt 137.0 lb

## 2013-09-12 DIAGNOSIS — R413 Other amnesia: Secondary | ICD-10-CM | POA: Insufficient documentation

## 2013-09-12 DIAGNOSIS — M79606 Pain in leg, unspecified: Secondary | ICD-10-CM

## 2013-09-12 DIAGNOSIS — M545 Low back pain, unspecified: Secondary | ICD-10-CM

## 2013-09-12 DIAGNOSIS — R531 Weakness: Secondary | ICD-10-CM

## 2013-09-12 DIAGNOSIS — M7918 Myalgia, other site: Secondary | ICD-10-CM

## 2013-09-12 NOTE — Progress Notes (Signed)
[facility]  Provider:  Melvyn Novasarmen  Tamey Wanek, M D  Referring Provider: Allean [name], [name], * Primary Care Physician:  [name],Cheryl THIELE, MD  Chief Complaint  Patient presents with  . New Evaluation    Room 10  . Memory Loss    MOCA 29/30    HPI:  [name] is a 73 y.o. caucasain, married and meanwhile retired female . She is seen here as a referral  from Dr. Katrinka [name] for memory evaluation, characterized by verbal communication difficulties,  word finding difficulties.   COMPLAINT: [name] describes how she has had difficulties recalling details from conversations, articles she may have read, perhaps even radio- broadcasts or information from books.  She states that to be effective in her conversations, he would need to recall and be able to quote the author and edition of a certain publication, as she used to. She was uncomfortable going to [place] on a mission for the [employer], as she could not get herself to retain the detail needed to discuss. She is painfully aware of this deficit , as her command of language and details used to be excellent.  Her husband became more concerned about her tendency to change words or the ending of words- especially when using longer words.  She gave two examples: In a cool room , she told her husband she was "happy to wear her purse". She has used the word "perspection " instead of "perspective". Using neologism and parapharasias.  Her daughter noted her to be quiet in conversations that she usually would contribute to. She is afraid to get stuck. She is unaware that she changed the words, until she notices the reaction of the listener.     Her rheumatologist/ gynecologist  diagnosed her with seronegative psoriatic arthritis and adrenal insufficiency. 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 This was in 2007-8.     This has become a  concern - she states this is not delayed recall , it's :" A word behind a wall and no way to get to it ".  Her paternal aunts had dementia, her mother had dementia.   Review of Systems: Out of a complete 14 system review, the patient complains of only the following symptoms, and all other reviewed systems are negative.  History   Social History  . Marital Status: Married    Spouse Name: Nedra HaiLee    Number of Children: 2  . Years of Education: Masters   Occupational History  . 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 Smoking status: Never Smoker   . Smokeless tobacco: Never Used  . Alcohol Use: Yes     Comment: drink small amts weekly  . Drug Use: No  . Sexual Activity:    Other Topics Concern  . Not on file   Social History Narrative   Patient is married [name] and lives at home with her husband.   Patient has two children.   Patient is retired.   Patient has a Master's degree.   Patient drinks two cups of coffee daily.   Patient is right-handed.          Family History  Problem Relation Age of Onset  . Colon cancer Mother   . Esophageal cancer Father   . Stomach cancer Father   . Rectal cancer Neg Hx   . Hypertension Father   . Hypertension  Mother   . Breast cancer Mother   . Heart attack Paternal Grandfather   . Diabetes Paternal Grandmother   . Liver cancer Maternal Grandmother     Past Medical History  Diagnosis Date  . Arthritis   . Blood transfusion without reported diagnosis   . Cataract   . GERD (gastroesophageal reflux disease)   . Hyperlipidemia   . Chronic kidney disease     hx bladder infections  . Neuromuscular disorder     chronic muscle pain- takes Progesterone  . Neuromuscular disease     fibromyalgia  . Fibromyalgia   . History of migraine headaches   . Hypothyroidism   . Gastric polyps     Past Surgical History  Procedure Laterality Date  . Tonsillectomy    . Rhinoplasty    . Abdominal  hysterectomy    . Appendectomy      with hysterectomy  . Cholecystectomy    . Hand surgery      middle finger right hand  . Rectocele repair    . Incontinence surgery    . Colonoscopy    . Cataract surgery      Current Outpatient Prescriptions  Medication Sig Dispense Refill  . butalbital-aspirin-caffeine (FIORINAL) 50-325-40 MG per capsule Take 1 capsule by mouth. As needed      . Cholecalciferol (VITAMIN D3) 1000 UNITS CAPS Take 1 capsule by mouth 2 (two) times daily.      Marland Kitchen. CLIMARA 0.1 MG/24HR Place 1 patch onto the skin once a week. monday      . clobetasol (TEMOVATE) 0.05 % external solution Apply 1 application topically 2 (two) times daily. As needed      . CRESTOR 5 MG tablet Take 5 mg by mouth daily.       . fluocinonide (LIDEX) 0.05 % external solution Apply 1 application topically 2 (two) times daily. As needed      . Multiple Vitamins-Minerals (PRESERVISION AREDS 2 PO) Take 1 tablet by mouth 2 (two) times daily.      . NON FORMULARY Take 125 mg by mouth at bedtime. Progesterone SR 125 mg every night      . NON FORMULARY Testosterone versa based cream 0.4%  Applies 0.5 mL four times weekly      . NON FORMULARY Cardicare B 2 Capsules twice daily      . Omega-3 Fatty Acids (FISH OIL) 1000 MG CAPS Take 1 capsule by mouth 2 (two) times daily.       . predniSONE (DELTASONE) 5 MG tablet Take 5 mg by mouth daily with breakfast.      . SYNTHROID 50 MCG tablet Take 50 mcg by mouth every morning.       . triamterene-hydrochlorothiazide (MAXZIDE) 75-50 MG per tablet Take 1 tablet by mouth daily as needed (for fluid buildup).        No current facility-administered medications for this visit.    Allergies as of 09/12/2013 - Review Complete 09/12/2013  Allergen Reaction Noted  . Imitrex [sumatriptan]  03/10/2012    Vitals: BP 126/78  Pulse 56  Resp 17  Ht 5' 5.5" (1.664 m)  Wt 137 lb (62.143 kg)  BMI 22.44 kg/m2 Last Weight:  Wt Readings from Last 1 Encounters:   09/12/13 137 lb (62.143 kg)   Last Height:   Ht Readings from Last 1 Encounters:  09/12/13 5' 5.5" (1.664 m)    Physical exam:  General: The patient is awake, alert and appears not in acute distress. The patient  is well groomed. Head: Normocephalic, atraumatic. Neck is supple. Mallampati 2 , neck circumference: 14, no TMJ, no nasal congestion.  Cardiovascular:  Regular rate and rhythm, without  murmurs or carotid bruit, and without distended neck veins. Respiratory: Lungs are clear to auscultation. Skin:  Without evidence of edema, or rash Trunk: this  patient has a normal posture.  Neurologic exam : The patient is awake and alert, oriented to place and time.   Family and patient subjective concerned about her memory :  MOCA 29-30 , only lost one word of recall.  No alexia, but not sure about agraphia- she is needing to check her writing and prefers the compute over handwriting for this reason.   Notebook user, list maker to have memory crutches.  There is a normal attention span & concentration ability.  Eloquent Speech is fluent without dysarthria, dysphonia or aphasia.  Mood and affect are appropriate.  Cranial nerves: Pupils are equal and briskly reactive to light.  Funduscopic exam without  evidence of pallor or edema. Extraocular movements  in vertical and horizontal planes intact and without nystagmus. Visual fields by finger perimetry are intact. Hearing to finger rub intact.  Facial sensation intact to fine touch. Facial motor strength is symmetric and tongue and uvula move midline.  Motor exam:   Normal tone,  muscle bulk and symmetric normal strength in all extremities.  Sensory:  Fine touch, pinprick and vibration were tested in all extremities.  Proprioception is  normal.  Coordination: Rapid alternating movements in the fingers/hands is tested and normal. Finger-to-nose maneuver tested and normal without evidence of ataxia, dysmetria or tremor.  Gait and station:  Patient walks without assistive device.  Strength within normal limits. Stance is stable and normal.Tandem gait is unfragmented. Romberg testing is normal.  Deep tendon reflexes: in the  upper and lower extremities are symmetric and intact. Babinski maneuver  downgoing.   Assessment:  After physical and neurologic examination, review of laboratory studies, imaging, neurophysiology testing and pre-existing records, assessment is  Not a semantic aphasia but word finding difficulties.   Plan:  Treatment plan and additional workup :  I like for this pleasant patient to have a detailed neuropsychological evaluation with a speech and language evaluation.  MRI brain .

## 2013-09-18 ENCOUNTER — Telehealth: Payer: Self-pay | Admitting: Neurology

## 2013-09-18 NOTE — Telephone Encounter (Signed)
Deidre [facility] calling to check on a precertification for patient's PET scan which is scheduled for tomorrow. Please call and advise.

## 2013-09-18 NOTE — Telephone Encounter (Signed)
Called [facility] and spoke directly to Deidre, (had called earlier and spoke to a Rad personnel),  Informed Deidre that Morrie Sheldonshley was already working on [employer]ification.  Morrie Sheldon(Ashley) works in the [employer] department and their was already a case open for this referral.  Ref # [id number].  A copy of the last office visit was faxed to [facility].  Deidre stated that if Morrie Sheldonshley needed anything further she would call the office back for more information.

## 2013-09-19 ENCOUNTER — Ambulatory Visit
Admission: RE | Admit: 2013-09-19 | Discharge: 2013-09-19 | Disposition: A | Discharge status: Home / Self Care | Payer: Medicare PPO | Source: Ambulatory Visit | Attending: Neurology | Admitting: Neurology

## 2013-09-19 DIAGNOSIS — R413 Other amnesia: Secondary | ICD-10-CM

## 2013-09-19 MED ORDER — GADOBENATE DIMEGLUMINE 529 MG/ML IV SOLN
12.0000 mL | Freq: Once | INTRAVENOUS | Status: AC | PRN
  Administered 2013-09-19: 12 mL via INTRAVENOUS

## 2013-09-19 NOTE — Telephone Encounter (Signed)
Per Dr. Vickey Hugerohmeier, cancel the Pet scan scheduled for 09/20/2013, has left several messages for [facility]obinson [facility] to peer review.  Patient was notified and called radiology department to cancel the appointment.

## 2013-09-20 ENCOUNTER — Encounter (HOSPITAL_COMMUNITY): Admission: RE | Admit: 2013-09-20 | Payer: Medicare PPO | Source: Ambulatory Visit

## 2013-09-28 NOTE — Progress Notes (Signed)
Quick Note:  Spoke to patient and relayed normal MRI brain, per Dr. Vickey Hugerohmeier. ______

## 2013-10-05 ENCOUNTER — Telehealth: Payer: Self-pay | Admitting: Neurology

## 2013-10-05 NOTE — Telephone Encounter (Signed)
Pt calling inquiring about an PET scan that was ordered. Please advise

## 2013-10-05 NOTE — Telephone Encounter (Signed)
Patient is calling regarding referral for a PET scan--she states her insurance has already approved the test--please call

## 2013-10-08 ENCOUNTER — Telehealth: Payer: Self-pay | Admitting: Neurology

## 2013-10-08 NOTE — Telephone Encounter (Signed)
[place] with Nuclear Medicine returning ToveyDana S call.

## 2013-10-08 NOTE — Telephone Encounter (Signed)
[facility] for October 22, 2013 at 1:30 pm.  Patient to be notified.

## 2013-10-08 NOTE — Telephone Encounter (Signed)
Patient was notified the appointment is scheduled for October 22, 2013 at 1:30 p.m..Marland Kitchen

## 2013-10-16 ENCOUNTER — Telehealth: Payer: Self-pay | Admitting: Neurology

## 2013-10-16 NOTE — Telephone Encounter (Signed)
[place], [place] with Pet Scan at [facility] requesting a return call @ [phone].  Thanks

## 2013-10-19 NOTE — Telephone Encounter (Signed)
[place] with Nuclear Medicine at [facility], calling to inform Dr Dohmeier patient's appointment has been rescheduled from 6/2 to 6/9 @ 2 pm.  Therefore, Dr Dohmeier appointment need to be rescheduled as well.

## 2013-10-22 ENCOUNTER — Encounter (HOSPITAL_COMMUNITY): Admission: RE | Admit: 2013-10-22 | Payer: Medicare PPO | Source: Ambulatory Visit

## 2013-10-22 NOTE — Telephone Encounter (Signed)
I called [facility] to give [name] a message that I would call the patient and re-schedule the appointment with Dr. Vickey Huger.

## 2013-10-22 NOTE — Telephone Encounter (Signed)
Patient was called and the appointment was re-scheduled for November 08, 2013 at 11:00 am.

## 2013-10-25 ENCOUNTER — Ambulatory Visit: Payer: Medicare PPO | Admitting: Neurology

## 2013-10-30 ENCOUNTER — Encounter (HOSPITAL_COMMUNITY)
Admission: RE | Admit: 2013-10-30 | Discharge: 2013-10-30 | Disposition: A | Discharge status: Home / Self Care | Payer: Medicare PPO | Source: Ambulatory Visit | Attending: Neurology | Admitting: Neurology

## 2013-10-30 DIAGNOSIS — R413 Other amnesia: Secondary | ICD-10-CM

## 2013-11-05 ENCOUNTER — Telehealth: Payer: Self-pay | Admitting: *Deleted

## 2013-11-05 NOTE — Telephone Encounter (Signed)
Spoke to interpreting radiologist ,  no signs of early amyloid deposits. Good news.

## 2013-11-06 NOTE — Telephone Encounter (Signed)
Left message that PET showed no signs of early amyloid deposits, per Dr. Vickey Hugerohmeier.  Told to call if she had further questions.

## 2013-11-08 ENCOUNTER — Ambulatory Visit (INDEPENDENT_AMBULATORY_CARE_PROVIDER_SITE_OTHER): Payer: Medicare PPO | Admitting: Neurology

## 2013-11-08 ENCOUNTER — Encounter: Payer: Self-pay | Admitting: Neurology

## 2013-11-08 VITALS — BP 109/63 | HR 86 | Resp 16 | Ht 65.5 in | Wt 136.0 lb

## 2013-11-08 DIAGNOSIS — I69920 Aphasia following unspecified cerebrovascular disease: Secondary | ICD-10-CM

## 2013-11-08 DIAGNOSIS — R413 Other amnesia: Secondary | ICD-10-CM

## 2013-11-08 DIAGNOSIS — R4701 Aphasia: Secondary | ICD-10-CM | POA: Insufficient documentation

## 2013-11-08 DIAGNOSIS — L4050 Arthropathic psoriasis, unspecified: Secondary | ICD-10-CM | POA: Insufficient documentation

## 2013-11-08 DIAGNOSIS — M2550 Pain in unspecified joint: Secondary | ICD-10-CM

## 2013-11-08 DIAGNOSIS — E279 Disorder of adrenal gland, unspecified: Secondary | ICD-10-CM

## 2013-11-08 MED ORDER — PREDNISONE 10 MG PO TABS: 10.0000 mg | ORAL_TABLET | Freq: Every day | ORAL | Status: DC

## 2013-11-08 NOTE — Progress Notes (Signed)
[facility]  Provider:  Melvyn Novas, M D  Referring Provider: Allean Found, * Primary Care Physician:  Allean Found, MD  Chief Complaint  Patient presents with  . Follow-up    Room 11  . Results    HPI:  [name] is a 73 y.o. caucasain, married and meanwhile retired female . She is seen here as a referral  from [name] for memory evaluation, characterized by verbal communication difficulties,  word finding difficulties.   [name] has noted neologisms as well as paraphasic errors with increasing frequency over the last 6-7 years.  I obtained an MRI of the brain ( 09-19-13 )  with and without contrast to evaluate her for possible vascular injuries or focal brain atrophies which returned entirely normal. Actually her brain looked much younger than her numeric age would normally suggest. There were no normal lesions,  all ventricles were normal in size,  nor atrophy no mass effect and no significant white matter disease.  The normal MRI was followed by a PET scan,  Fuorbetapir uptake did not indicate Alzheimer's Tangles, these news were a great relief. ( Dr. Amil Amen , 11-05-13)   The patient was placed on a higher dose taper  of Prednisone for the treatment of joint pain by her [facility] Rheumatologist , and she noted a mental clarity that she craved during therapy.  Trochanteric and knee arthralgias and bursitis cleared temporarily and she felt the reduced pain allowed her to function mentally better as well as physically. Her sleep was better , 7.5 hours on the prednisone.   She has exchanged words again after weaning off, told her daughter" to switch [name]'s doctor" instead of "diaper ". She noted that she writes fluently and when reading the product , sees neologims and paraphrasic errors even than - word substitution . Could this be a primary progressive aphasia ? Could this be pain related, insomnia? She sleeps from 11 PM  to 5.30 AM  right now with pain.                  COMPLAINT: [name] describes how she has had difficulties recalling details from conversations, articles she may have read, perhaps even radio- broadcasts or information from books.  She states that to be effective in her conversations, he would need to recall and be able to quote the author and edition of a certain publication, as she used to. She was uncomfortable going to [employer]  on a mission for [name], as she could not get herself to retain the detail needed to discuss.  She is painfully aware of this deficit , as her command of language and details used to be excellent.  Her husband became more concerned about her tendency to change words or the ending of words- especially when using longer words.  She gave two examples: In a cool room , she told her husband she was "happy to wear her purse". She has used the word "perspection " instead of "perspective". Using neologism and parapharasias.  Her daughter noted her to be quiet in conversations that she usually would contribute to. She is afraid to get stuck. She is unaware that she changed the words, until she notices the reaction of the listener.   Her rheumatologist/ gynecologist  diagnosed her with seronegative psoriatic arthritis and adrenal insufficiency. 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  This was in 2007-8.   This has become a concern - she states this is not delayed recall , it's :" A word behind a wall and no way to get to it ".  Her paternal aunts had dementia,  her mother had no dementia  in advanced age (6) .   Review of Systems: Out of a complete 14 system review, the patient complains of only the following symptoms, and all other reviewed systems are negative.  Joint pain, post nasal drip, hoarseness, no laryngitis.   History   Social History  . Marital  Status: Married    Spouse Name: [name]    Number of Children: 2  . Years of Education: Masters   Occupational History  . 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 Smoking status: Never Smoker   . Smokeless tobacco: Never Used  . Alcohol Use: Yes     Comment: drink small amts weekly  . Drug Use: No  . Sexual Activity:    Other Topics Concern  . Not on file   Social History Narrative   Patient is married [name]) and lives at home with her husband.   Patient has two children.   Patient is retired.   Patient has a Master's degree.   Patient drinks two cups of coffee daily.   Patient is right-handed.          Family History  Problem Relation Age of Onset  . Colon cancer Mother   . Esophageal cancer Father   . Stomach cancer Father   . Rectal cancer Neg Hx   . Hypertension Father   . Hypertension Mother   . Breast cancer Mother   . Heart attack Paternal Grandfather   . Diabetes Paternal Grandmother   . Liver cancer Maternal Grandmother     Past Medical History  Diagnosis Date  . Arthritis   . Blood transfusion without reported diagnosis   . Cataract   . GERD (gastroesophageal reflux disease)   . Hyperlipidemia   . Chronic kidney disease     hx bladder infections  . Neuromuscular disorder     chronic muscle pain- takes Progesterone  . Neuromuscular disease     fibromyalgia  . Fibromyalgia   . History of migraine headaches   . Hypothyroidism   . Gastric polyps     Past Surgical History  Procedure Laterality Date  . Tonsillectomy    . Rhinoplasty    . Abdominal hysterectomy    . Appendectomy      with hysterectomy  . Cholecystectomy    . Hand surgery      middle finger right hand  . Rectocele repair    . Incontinence surgery    . Colonoscopy    . Cataract surgery      Current Outpatient Prescriptions  Medication Sig Dispense Refill  . butalbital-aspirin-caffeine (FIORINAL) 50-325-40 MG per capsule Take 1 capsule by  mouth. As needed      . Cholecalciferol (VITAMIN D3) 1000 UNITS CAPS Take 1 capsule by mouth 2 (two) times daily.      Marland Kitchen CLIMARA 0.1 MG/24HR Place 1 patch onto the skin once a week. monday      . clobetasol (TEMOVATE) 0.05 % external solution Apply 1 application topically 2 (two) times daily. As needed      . CRESTOR 5 MG tablet Take 5 mg by mouth daily.       . fluocinonide (LIDEX) 0.05 % external solution Apply 1  application topically 2 (two) times daily. As needed      . Multiple Vitamins-Minerals (PRESERVISION AREDS 2 PO) Take 1 tablet by mouth 2 (two) times daily.      . NON FORMULARY Take 100 mg by mouth at bedtime. Progesterone SR 100 mg every night      . NON FORMULARY Testosterone versa based cream 0.4%  Applies 0.5 mL four times weekly      . NON FORMULARY Corticare B 2 Capsules twice daily      . Omega-3 Fatty Acids (FISH OIL) 1000 MG CAPS Take 1 capsule by mouth 2 (two) times daily.       . SULFAZINE 500 MG tablet 1 tablet daily. Will build up to taking three times a day.      Marland Kitchen. SYNTHROID 50 MCG tablet Take 50 mcg by mouth every morning.       . triamterene-hydrochlorothiazide (MAXZIDE) 75-50 MG per tablet Take 1 tablet by mouth daily as needed (for fluid buildup).        No current facility-administered medications for this visit.    Allergies as of 11/08/2013 - Review Complete 11/08/2013  Allergen Reaction Noted  . Imitrex [sumatriptan]  03/10/2012    Vitals: BP 109/63  Pulse 86  Resp 16  Ht 5' 5.5" (1.664 m)  Wt 136 lb (61.689 kg)  BMI 22.28 kg/m2 Last Weight:  Wt Readings from Last 1 Encounters:  11/08/13 136 lb (61.689 kg)   Last Height:   Ht Readings from Last 1 Encounters:  11/08/13 5' 5.5" (1.664 m)    Physical exam:  General: The patient is awake, alert and appears not in acute distress. The patient is well groomed. Head: Normocephalic, atraumatic. Neck is supple. Mallampati 2 , neck circumference: 14, no TMJ, no nasal congestion.  Cardiovascular:   Regular rate and rhythm, without  murmurs or carotid bruit, and without distended neck veins. Respiratory: Lungs are clear to auscultation. Skin:  Without evidence of edema, or rash Trunk: this  patient has a normal posture.  Neurologic exam : The patient is awake and alert, oriented to place and time.   Family and patient subjective concerned about her memory :  MOCA 29-30 , only lost one word of recall.  No alexia, but not sure about agraphia- she is needing to check her writing and prefers the compute over handwriting for this reason.   Notebook user, list maker to have memory crutches.  There is a normal attention span & concentration ability.  Eloquent Speech is fluent without dysarthria, dysphonia or aphasia.  Mood and affect are appropriate.  Cranial nerves: Pupils are equal and briskly reactive to light.  Funduscopic exam without  evidence of pallor or edema. Extraocular movements  in vertical and horizontal planes intact and without nystagmus. Normal smooth pursuit , frontal release signs not present. No rooting , Visual fields by finger perimetry are intact. Hearing to finger rub intact.  Facial sensation intact to fine touch. Facial motor strength is symmetric and tongue and uvula move midline.  Motor exam:   Normal tone,  muscle bulk and symmetric normal strength in all extremities. No gegenhalten.    Sensory:  Fine touch, pinprick and vibration were tested in all extremities.  Proprioception is  normal.  Coordination: Rapid alternating movements in the fingers/hands is tested and normal. Finger-to-nose maneuver tested and normal without evidence of ataxia, dysmetria or tremor.  Gait and station: Patient walks without assistive device.  Strength within normal limits. Stance is stable and  normal.Tandem gait is unfragmented. Romberg testing is normal.  Deep tendon reflexes: in the  upper and lower extremities are symmetric and intact. Babinski maneuver   downgoing.   Assessment:  After physical and neurologic examination, review of laboratory studies, imaging, neurophysiology testing and pre-existing records, assessment is  Not a semantic aphasia but word finding difficulties.   Plan:  Treatment plan and additional workup :  I like for this pleasant patient to have a detailed neuropsychological evaluation with a speech and language evaluation.  Speech evaluation with speech pathologist. Please evaluate for a possible conduction or transcortical aphasia, without alexia or agraphia.

## 2013-11-08 NOTE — Addendum Note (Signed)
Addended by: Melvyn NovasHMEIER, CARMEN on: 11/08/2013 12:00 PM   Modules accepted: Orders

## 2013-11-11 ENCOUNTER — Encounter: Payer: Self-pay | Admitting: Neurology

## 2013-11-12 ENCOUNTER — Other Ambulatory Visit: Payer: Medicare PPO | Admitting: Radiology

## 2013-11-12 NOTE — Telephone Encounter (Signed)
Dear Mrs. Droll,  You can dose your current  Prednisone prescription  for 10 mg tabs according to the 5 mg schedule.  Just take 1/2 tab for every 5 mg dose you took. All in the AM intake time with some food.  C D

## 2013-11-22 ENCOUNTER — Ambulatory Visit (INDEPENDENT_AMBULATORY_CARE_PROVIDER_SITE_OTHER): Payer: Medicare PPO | Admitting: Radiology

## 2013-11-22 DIAGNOSIS — M2550 Pain in unspecified joint: Secondary | ICD-10-CM

## 2013-11-22 DIAGNOSIS — R4701 Aphasia: Secondary | ICD-10-CM

## 2013-11-22 DIAGNOSIS — I69920 Aphasia following unspecified cerebrovascular disease: Secondary | ICD-10-CM

## 2013-11-22 DIAGNOSIS — R413 Other amnesia: Secondary | ICD-10-CM

## 2013-11-30 NOTE — Procedures (Signed)
[facility]  EEG (ELECTROENCEPHALOGRAM) REPORT   STUDY DATE:  11-22-13  PATIENT NAME:  [name], Cheryl MRN: [id number]    ORDERING CLINICIAN: Melvyn Novasarmen Cambren Helm, MD   TECHNOLOGIST: Caryn SectionFox  TECHNIQUE: Electroencephalogram was recorded utilizing standard 10-20 system of lead placement and reformatted into average and bipolar montages.      RECORDING TIME:  30 minutes  ACTIVATION:  strobe lights and hyperventilation    CLINICAL INFORMATION: Patient of Dr. Merri Brunetteandace Smith  Word finding difficulties, neologisms and paraphasic errors.  Normal MOCA, MMSE and MRI as well as amyloid  PET  Scan.   FINDINGS: EEG  Background rhythm of 8.5 hertz in both posterior occipital hemispheres , 8-9 hertz, dominant rhythm.Well organized. Responding to photic stimulation ,  No epileptiform discharge or activity. EKG in NSR with PACs , about every 20 to 30 seconds-  The patient's  EKG heart rates varied from 68 to 74 bpm, NSR.      Impression, EEG is normal in awake and asleep stages.   Braxtin Bamba, MD         Melvyn Novasarmen Jevonte Clanton , MD

## 2013-12-05 NOTE — Progress Notes (Signed)
Quick Note:  Shared normal results with patient thru VM message ______ 

## 2013-12-27 NOTE — Telephone Encounter (Signed)
Noted  

## 2014-02-25 ENCOUNTER — Encounter: Payer: Self-pay | Admitting: Gastroenterology

## 2014-03-12 ENCOUNTER — Encounter: Payer: Self-pay | Admitting: Neurology

## 2014-03-12 ENCOUNTER — Ambulatory Visit (INDEPENDENT_AMBULATORY_CARE_PROVIDER_SITE_OTHER): Payer: Medicare PPO | Admitting: Neurology

## 2014-03-12 VITALS — BP 108/66 | HR 65 | Temp 98.1°F | Resp 12 | Ht 65.5 in | Wt 134.0 lb

## 2014-03-12 DIAGNOSIS — R4701 Aphasia: Secondary | ICD-10-CM

## 2014-03-12 NOTE — Progress Notes (Signed)
[facility]  Provider:  Melvyn Novas, M D  Referring Provider: Allean Found, * Primary Care Physician:  Allean Found, MD  Chief Complaint  Patient presents with  . RV  memory    Rm 11, alone    HPI:  [name] is a 73 y.o. caucasain, married and meanwhile retired female . She is seen here as a referral  from Dr. Katrinka Blazing for memory evaluation, characterized by verbal communication difficulties,  word finding difficulties.   [name] has noted neologisms as well as paraphasic errors with increasing frequency over the last 6-7 years.  I obtained an MRI of the brain ( 09-19-13 )  with and without contrast to evaluate her for possible vascular injuries or focal brain atrophies which returned entirely normal. Actually her brain looked much younger than her numeric age would normally suggest. There were no normal lesions,  all ventricles were normal in size,  nor atrophy no mass effect and no significant white matter disease.  The normal MRI was followed by a PET scan,  Fuorbetapir uptake did not indicate Alzheimer's Tangles, these news were a great relief. ( Dr. Amil Amen , 11-05-13)   The patient was placed on a higher dose taper  of Prednisone for the treatment of joint pain by her [facility] Rheumatologist , and she noted a mental clarity that she craved during therapy.  Trochanteric and knee arthralgias and bursitis cleared temporarily and she felt the reduced pain allowed her to function mentally better as well as physically. Her sleep was better , 7.5 hours on the prednisone.   She has exchanged words again after weaning off, told her daughter" to switch [name]'s doctor" instead of "diaper ". She noted that she writes fluently and when reading the product , sees neologims and paraphrasic errors even than - word substitution . Could this be a primary progressive aphasia ? Could this be pain related, insomnia? She sleeps from 11 PM  to 5.30 AM right now  with pain.                  COMPLAINT: [name] describes how she has had difficulties recalling details from conversations, articles she may have read, perhaps even radio- broadcasts or information from books.  She states that to be effective in her conversations, he would need to recall and be able to quote the author and edition of a certain publication, as she used to. She was uncomfortable going to [employer]  on a mission for [employer], as she could not get herself to retain the detail needed to discuss.  She is painfully aware of this deficit , as her command of language and details used to be excellent.  Her husband became more concerned about her tendency to change words or the ending of words- especially when using longer words.  She gave two examples: In a cool room , she told her husband she was "happy to wear her purse". She has used the word "perspection " instead of "perspective". Using neologism and parapharasias.  Her daughter noted her to be quiet in conversations that she usually would contribute to. She is afraid to get stuck. She is unaware that she changed the words, until she notices the reaction of the listener.   Her rheumatologist/ gynecologist  diagnosed her with seronegative psoriatic arthritis and adrenal insufficiency. 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  This was in 2007-8.   This has become a concern - she states this is not delayed recall , it's :" A word behind a wall and no way to get to it ".  Her paternal aunts had dementia,  her mother had no dementia  in advanced age 66(90) .   Review of Systems: Out of a complete 14 system review, the patient complains of only the following symptoms, and all other reviewed systems are negative.  Joint pain, post nasal drip, hoarseness, no laryngitis.   History   Social History  . Marital Status:  Married    Spouse Name: [name]    Number of Children: 2  . Years of Education: Masters   Occupational History  . 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 Smoking status: Never Smoker   . Smokeless tobacco: Never Used  . Alcohol Use: Yes     Comment: 2 ounce red wine 3-4 wks  . Drug Use: No  . Sexual Activity: Not on file   Other Topics Concern  . Not on file   Social History Narrative   Patient is married Cheryl Hai([name] and lives at home with her husband.   Patient has two children.   Patient is retired.   Patient has a Master's degree.   Patient drinks two cups of coffee daily.   Patient is right-handed.          Family History  Problem Relation Age of Onset  . Colon cancer Mother   . Esophageal cancer Father   . Stomach cancer Father   . Rectal cancer Neg Hx   . Hypertension Father   . Hypertension Mother   . Breast cancer Mother   . Heart attack Paternal Grandfather   . Diabetes Paternal Grandmother   . Liver cancer Maternal Grandmother     Past Medical History  Diagnosis Date  . Arthritis   . Blood transfusion without reported diagnosis   . Cataract   . GERD (gastroesophageal reflux disease)   . Hyperlipidemia   . Chronic kidney disease     hx bladder infections  . Neuromuscular disorder     chronic muscle pain- takes Progesterone  . Neuromuscular disease     fibromyalgia  . Fibromyalgia   . History of migraine headaches   . Hypothyroidism   . Gastric polyps   . Adrenal gland dysfunction      dr Vincente PoliGrewal, [facility]     Past Surgical History  Procedure Laterality Date  . Tonsillectomy    . Rhinoplasty    . Abdominal hysterectomy    . Appendectomy      with hysterectomy  . Cholecystectomy    . Hand surgery      middle finger right hand  . Rectocele repair    . Incontinence surgery    . Colonoscopy    . Cataract surgery      Current Outpatient Prescriptions  Medication Sig Dispense Refill  .  butalbital-aspirin-caffeine (FIORINAL) 50-325-40 MG per capsule Take 1 capsule by mouth. As needed      . Cholecalciferol (VITAMIN D3) 1000 UNITS CAPS Take 1 capsule by mouth 2 (two) times daily.      Marland Kitchen. CLIMARA 0.1 MG/24HR Place 1 patch onto the skin once a week. monday      . clobetasol (TEMOVATE) 0.05 % external solution Apply 1 application topically 2 (two) times daily. As needed      . CRESTOR 5 MG tablet Take 5  mg by mouth daily.       . fluocinonide (LIDEX) 0.05 % external solution Apply 1 application topically 2 (two) times daily. As needed      . Multiple Vitamins-Minerals (PRESERVISION AREDS 2 PO) Take 1 tablet by mouth 2 (two) times daily.      . NON FORMULARY Take 100 mg by mouth at bedtime. Progesterone SR 100 mg every night      . NON FORMULARY Testosterone versa based cream 0.4%  Applies 0.5 mL four times weekly      . NON FORMULARY Corticare B 2 Capsules twice daily      . Omega-3 Fatty Acids (FISH OIL) 1000 MG CAPS Take 1 capsule by mouth 2 (two) times daily.       . SULFAZINE 500 MG tablet 1 tablet daily. Will build up to taking 2 tabs twice daily.      Marland Kitchen. SYNTHROID 50 MCG tablet Take 50 mcg by mouth every morning.       . triamterene-hydrochlorothiazide (MAXZIDE) 75-50 MG per tablet Take 1 tablet by mouth daily as needed (for fluid buildup).       . predniSONE (DELTASONE) 10 MG tablet Take 1 tablet (10 mg total) by mouth daily with breakfast. 6 tablets divided TID, for 6 days ( 36) followed by 10mg  one  Tab tid po for 6 days (18)  Followed by one in AM for 6 days.  60 tablet  0   No current facility-administered medications for this visit.    Allergies as of 03/12/2014 - Review Complete 03/12/2014  Allergen Reaction Noted  . Imitrex [sumatriptan]  03/10/2012  . Keflex [cephalexin]  03/12/2014  . Nsaids  03/12/2014    Vitals: BP 108/66  Pulse 65  Temp(Src) 98.1 F (36.7 C) (Oral)  Resp 12  Ht 5' 5.5" (1.664 m)  Wt 134 lb (60.782 kg)  BMI 21.95 kg/m2 Last Weight:   Wt Readings from Last 1 Encounters:  03/12/14 134 lb (60.782 kg)   Last Height:   Ht Readings from Last 1 Encounters:  03/12/14 5' 5.5" (1.664 m)    Physical exam:  General: The patient is awake, alert and appears not in acute distress. The patient is well groomed. Head: Normocephalic, atraumatic. Neck is supple. Mallampati 2 , neck circumference: 14, no TMJ, no nasal congestion.  Cardiovascular:  Regular rate and rhythm, without  murmurs or carotid bruit, and without distended neck veins. Respiratory: Lungs are clear to auscultation. Skin:  Without evidence of edema, or rash Trunk: this  patient has a normal posture.  Neurologic exam : The patient is awake and alert, oriented to place and time.     Family and patient subjective concerned about her memory :  MOCA was 29-30 , only lost one word of recall. In  the last test from 08-2013.  Today 30-30 points. She subjectivly felt that serial 7 was difficult.  No alexia, but not sure about agraphia- she is needing to check her writing and prefers the compute over handwriting for this reason.   Notebook user, list maker to have memory crutches.  There is a normal attention span & concentration ability.  Eloquent Speech is fluent without dysarthria, dysphonia or aphasia. She subjectively feels still impaired in immediate recall and memory of spoken information.   Mood and affect are appropriate.  Cranial nerves: Pupils are equal and briskly reactive to light. Funduscopic exam without  evidence of pallor or edema. Extraocular movements  in vertical and horizontal planes intact and  without nystagmus. Normal smooth pursuit , frontal release signs not present. No rooting , Visual fields by finger perimetry are intact. Hearing to finger rub intact.  Facial sensation intact to fine touch. Facial motor strength is symmetric and tongue and uvula move midline.  Motor exam:   Normal tone,  muscle bulk and symmetric normal strength in all  extremities. No gegenhalten.   Sensory:  Fine touch, pinprick and vibration were tested in all extremities.  Proprioception is  normal.  Coordination: Rapid alternating movements in the fingers/hands is tested and normal. Finger-to-nose maneuver , normal without evidence of ataxia, dysmetria or tremor.  Gait and station: Patient walks without assistive device.  Strength within normal limits. Stance is stable, narrow based and normal. Tandem gait is un-fragmented and steady.  Deep tendon reflexes: in the  upper and lower extremities are symmetric and intact. Babinski maneuver  downgoing.   Assessment:  After physical and neurologic examination, review of laboratory studies, imaging, neurophysiology testing and pre-existing records, assessment is  Not a semantic aphasia but word finding difficulties.   Plan:  Treatment plan and additional workup : I like for this pleasant patient to have a detailed neuropsychological evaluation with a speech and language evaluation.  Has seen Dr. Leonides Cave, and I like a RV to retest with him or a Colleague.  MOCA was 29-30, but this patient is highly educated and very intelligent. Please evaluate for a possible conduction or transcortical aphasia, without alexia or agraphia. She felt that the aphasia component is improved gradually.  Only new med is sulfasalazine. Sleep is improved on Progesterone. No dream recollection.    I suggested a trial of 5 mg Aricept, which the patient declined.  Yearly RV with MOCA.

## 2014-05-07 ENCOUNTER — Emergency Department (HOSPITAL_COMMUNITY)
Admission: EM | Admit: 2014-05-07 | Discharge: 2014-05-07 | Disposition: A | Discharge status: Home / Self Care | Payer: Medicare PPO | Attending: Emergency Medicine | Admitting: Emergency Medicine

## 2014-05-07 ENCOUNTER — Emergency Department (HOSPITAL_COMMUNITY): Payer: Medicare PPO

## 2014-05-07 ENCOUNTER — Encounter (HOSPITAL_COMMUNITY): Payer: Self-pay

## 2014-05-07 DIAGNOSIS — Z79899 Other long term (current) drug therapy: Secondary | ICD-10-CM | POA: Diagnosis not present

## 2014-05-07 DIAGNOSIS — N189 Chronic kidney disease, unspecified: Secondary | ICD-10-CM | POA: Diagnosis not present

## 2014-05-07 DIAGNOSIS — Z8679 Personal history of other diseases of the circulatory system: Secondary | ICD-10-CM | POA: Diagnosis not present

## 2014-05-07 DIAGNOSIS — Z8719 Personal history of other diseases of the digestive system: Secondary | ICD-10-CM | POA: Diagnosis not present

## 2014-05-07 DIAGNOSIS — E039 Hypothyroidism, unspecified: Secondary | ICD-10-CM | POA: Diagnosis not present

## 2014-05-07 DIAGNOSIS — Z8739 Personal history of other diseases of the musculoskeletal system and connective tissue: Secondary | ICD-10-CM | POA: Insufficient documentation

## 2014-05-07 DIAGNOSIS — Y9289 Other specified places as the place of occurrence of the external cause: Secondary | ICD-10-CM | POA: Diagnosis not present

## 2014-05-07 DIAGNOSIS — X58XXXA Exposure to other specified factors, initial encounter: Secondary | ICD-10-CM | POA: Insufficient documentation

## 2014-05-07 DIAGNOSIS — Z7952 Long term (current) use of systemic steroids: Secondary | ICD-10-CM | POA: Insufficient documentation

## 2014-05-07 DIAGNOSIS — Z8669 Personal history of other diseases of the nervous system and sense organs: Secondary | ICD-10-CM | POA: Insufficient documentation

## 2014-05-07 DIAGNOSIS — T17290A Other foreign object in pharynx causing asphyxiation, initial encounter: Secondary | ICD-10-CM | POA: Diagnosis present

## 2014-05-07 DIAGNOSIS — Y9389 Activity, other specified: Secondary | ICD-10-CM | POA: Diagnosis not present

## 2014-05-07 DIAGNOSIS — T17900A Unspecified foreign body in respiratory tract, part unspecified causing asphyxiation, initial encounter: Secondary | ICD-10-CM

## 2014-05-07 DIAGNOSIS — Y998 Other external cause status: Secondary | ICD-10-CM | POA: Insufficient documentation

## 2014-05-07 DIAGNOSIS — T17908A Unspecified foreign body in respiratory tract, part unspecified causing other injury, initial encounter: Secondary | ICD-10-CM

## 2014-05-07 LAB — BASIC METABOLIC PANEL
Anion gap: 16 — ABNORMAL HIGH (ref 5–15)
BUN: 15 mg/dL (ref 6–23)
CO2: 23 meq/L (ref 19–32)
Calcium: 9.3 mg/dL (ref 8.4–10.5)
Chloride: 101 mEq/L (ref 96–112)
Creatinine, Ser: 0.68 mg/dL (ref 0.50–1.10)
GFR calc Af Amer: >90 mL/min (ref >90)
GFR, EST NON AFRICAN AMERICAN: 86 mL/min — AB (ref >90)
GLUCOSE: 96 mg/dL (ref 70–99)
POTASSIUM: 3.9 meq/L (ref 3.7–5.3)
Sodium: 140 mEq/L (ref 137–147)

## 2014-05-07 LAB — CBC WITH DIFFERENTIAL/PLATELET
Basophils Absolute: 0 10*3/uL (ref 0.0–0.1)
Basophils Relative: 0 % (ref 0–1)
EOS ABS: 0.1 10*3/uL (ref 0.0–0.7)
Eosinophils Relative: 1 % (ref 0–5)
HCT: 42.2 % (ref 36.0–46.0)
HEMOGLOBIN: 13.5 g/dL (ref 12.0–15.0)
LYMPHS ABS: 2.1 10*3/uL (ref 0.7–4.0)
LYMPHS PCT: 34 % (ref 12–46)
MCH: 33.3 pg (ref 26.0–34.0)
MCHC: 32 g/dL (ref 30.0–36.0)
MCV: 104.2 fL — AB (ref 78.0–100.0)
MONOS PCT: 7 % (ref 3–12)
Monocytes Absolute: 0.4 10*3/uL (ref 0.1–1.0)
NEUTROS PCT: 58 % (ref 43–77)
Neutro Abs: 3.5 10*3/uL (ref 1.7–7.7)
Platelets: 169 10*3/uL (ref 150–400)
RBC: 4.05 MIL/uL (ref 3.87–5.11)
RDW: 13.9 % (ref 11.5–15.5)
WBC: 6.1 10*3/uL (ref 4.0–10.5)

## 2014-05-07 LAB — CBG MONITORING, ED: Glucose-Capillary: 123 mg/dL — ABNORMAL HIGH (ref 70–99)

## 2014-05-07 MED ORDER — SODIUM CHLORIDE 0.9 % IV BOLUS (SEPSIS)
1000.0000 mL | Freq: Once | INTRAVENOUS | Status: AC
  Administered 2014-05-07: 1000 mL via INTRAVENOUS

## 2014-05-07 NOTE — Discharge Instructions (Signed)
Aspiration Precautions Aspiration is the inhaling of a liquid or object into the lungs. Things that can be inhaled into the lungs include:  Food.  Any type of liquid, such as drinks or saliva.  Stomach contents, such as vomit or stomach acid. When these things go into the lungs, damage can occur. Serious complications can then result, such as:  A lung infection (pneumonia).  A collection of pus in the lungs (lung abscess). CAUSES 1. A decreased level of awareness (consciousness) due to: 1. Traumatic brain injury or head injury. 2. Stroke. 3. Neurological disease. 4. Seizures. 2. Decreased or absent gag reflex (inability to cough). 3. Medical conditions that affect swallowing. 4. Conditions that affect the food pipe (esophagus) such as a narrowing of the esophagus (esophageal stricture). 5. Gastroesophageal reflux (GERD). This is also known as acid reflux. 6. Any type of surgery where you are put under general anesthesia or have sedation. 7. Drinking large amounts of alcohol. 8. Taking medication that causes drowsiness, confusion, or weakness. 9. Aging. 10. Dental problems. 11. Having a feeding tube. SYMPTOMS When aspiration occurs, different signs and symptoms can occur, such as: 1. Coughing (if a person has a cough or gag reflex) after swallowing food or liquids. 2. Difficulty breathing. This can include things like: 1. Breathing rapidly. 2. Breathing very slowly. 3. Loud breathing. 4. Hearing "gurgling" lung sounds when a person breaths. 3. Coughing up phlegm (sputum) that is: 1. Yellow, tan, or green in color. 2. Has pieces of food in it. 3. Bad smelling. 4. A change in voice (hoarseness) or a "gurgly" sound to the voice. 5. A change in skin color. The skin may turn red, or a "bluish" type color because of a lack of oxygen (cyanosis). 6. Fever. 7. Eyes watering. 8. Pain in the chest or back. 9. Facial grimacing . 10. A feeling of fullness in the throat or that  something is stuck in the throat. DIAGNOSIS 1. A chest X-ray may be performed. This takes a picture of your lungs. It can show changes in the lungs if aspiration has occurred. 2. A bronchoscopy may be performed. This is a surgical procedure in which a thin, flexible tube with a camera at the end is inserted into the nose or mouth. The tube is advanced to the lungs so your health care provider can view the lungs and obtain a culture, tissue sample, or remove an aspirated object. 3. A swallowing evaluation study may be performed to evaluate: 1. A person's risk of aspiration. 2. How difficult it is for a person to swallow. 3. What types of foods are safe for a person to eat. PREVENTION If you are a caregiver to someone who may aspirate, follow the directions below. If you are caring for someone who can eat and drink through their mouth: 1. Have them sit in an upright position when eating food or drinking fluids, such as: 1. Sitting up in a chair. 2. If sitting in a chair is not possible, position the person in bed so they are upright. 2. Remind the person to eat slowly and chew well. 3. Do not distract the person. This is especially important for people with thinking or memory (cognitive) problems. 4. Check the person's mouth for leftover food after eating. 5. Keep the person sitting upright for 30 to 45 minutes after eating. 6. Do not serve food or drink for at least 2 hours before bedtime. If you are caring for someone with a feeding tube and he or she  cannot eat or drink through their mouth: 1. Keep the person in an upright position as much as possible. 2. Do not  lay the person flat if they are getting continuous feedings. Turn the feeding pump off if you need to lay the person flat for any reason. 3. Check feeding tube residuals as directed by your health care provider. If a large amount of tube feedings are pulled back (aspirated) from the feeding tube, call your health care provider right  away. General guidelines to prevent aspiration include:  Feed small amounts of food. Do not force feed.  Use as little water as possible when brushing the person's teeth or cleaning his or her mouth.  Provide oral care before and after meals.  Never put food or fluids in the mouth of a person who is not fully alert.  Crush pills and put them in soft food such as pudding or ice cream. Some pills should not be crushed. Check with your health care provider before crushing any medication. SEEK IMMEDIATE MEDICAL CARE IF:   The person has trouble breathing or starts to breathe rapidly.  The person is breathing very slowly or stops breathing.  The person coughs a lot after eating or drinking.  The person has a chronic cough.  The person coughs up thick, yellow, or tan sputum.  The person has a fever or persistent symptoms for more than 72 hours.  The person has a fever and their symptoms suddenly get worse. Document Released: 06/12/2010 Document Revised: 05/15/2013 Document Reviewed: 08/15/2013 [facility] Patient Information 2015 [place]. This information is not intended to replace advice given to you by your health care provider. Make sure you discuss any questions you have with your health care provider.    Choking Choking occurs when a food or object gets stuck in the throat or trachea, blocking the airway. If the airway is partly blocked, coughing will usually cause the food or object to come out. If the airway is completely blocked, immediate action is needed to help it come out. A complete airway blockage is life threatening because it causes breathing to stop. Choking is a true medical emergency that requires fast, appropriate action by anyone available. SIGNS OF AIRWAY BLOCKAGE There is a partial airway blockage if you or the person who is choking is:   Able to breathe and speak.  Coughing loudly.  Making loud noises. There is a complete airway blockage if you or the  person who is choking is:   Unable to breathe.  Making soft or high-pitched sounds while breathing.  Unable to cough or coughing weakly, ineffectively, or silently.  Unable to cry, speak, or make sounds.  Turning blue.  Holding the neck with both arms. This is the universal sign of choking. WHAT TO DO IF CHOKING OCCURS If there is a partial airway blockage, allow coughing to clear the airway. Do not try to drink until the food or object comes out. If someone else has a partial airway blockage, do not interfere. Stay with him or her and watch for signs of complete airway blockage until the food or object comes out.  If there is a complete airway blockage or if there is a partial airway blockage and the food or object does not come out, perform abdominal thrusts (also referred to as the Heimlich maneuver). Abdominal thrusts are used to create an artificial cough to try to clear the airway. Performing abdominal thrusts is part of a series of steps that should be  done to help someone who is choking. Abdominal thrusts are usually done by someone else, but if you are alone, you can perform abdominal thrusts on yourself. Follow the procedure below that best fits your situation.  IF SOMEONE ELSE IS CHOKING: For a conscious adult:  12. Ask the person whether he or she is choking. If the person nods, continue to step 2. 13. Stand or kneel behind the person and lean him or her forward slightly. 14. Make a fist with 1 hand, put your arms around the person, and grasp your fist with your other hand. Place the thumb side of your fist in the person's abdomen, just below the ribs. 15. Press inward and upward with both hands. 16. Repeat this maneuver until the object comes out and the person is able to breathe or until the person loses consciousness. For an unconscious adult: 90. Shout for help. If someone responds, have him or her call local emergency services (911 in [place] If no one responds, call local  emergency services yourself if possible. 12. Begin CPR, starting with compressions. Every time you open the airway to give rescue breaths, open the person's mouth. If you can see the food or object and it can be easily pulled out, remove it with your fingers. 13. After 5 cycles or 2 minutes of CPR, call local emergency services (911 in [place] if you or someone else did not already call. For a conscious adult who is obese or in the later stages of pregnancy: Abdominal thrusts may not be effective when helping people who are in the later stages of pregnancy or who are obese. In these instances, chest thrusts can be used.  4. Ask the person whether he or she is choking. If the person nods and has signs of complete airway blockage, continue to step 2. 5. Stand behind the person and wrap your arms around his or her chest (with your arms under the person's armpits). 6. Make a fist with 1 hand. Place the thumb side of your fist on the middle of the person's breastbone. 7. Grab your fist with your other hand and thrust backward. Continue this until the object comes out or until the person becomes unconscious. For an unconscious adult who is obese or in the later stages of pregnancy:  7. Shout for help. If someone responds, have him or her call local emergency services (911 in [place] If no one responds, call local emergency services yourself if possible. 8. Begin CPR, starting with compressions. Every time you open the airway to give rescue breaths, open the person's mouth. If you can see the food or object and it can be easily pulled out, remove it with your fingers. 9. After 5 cycles or 2 minutes of CPR, call local emergency services (911 in [place] if you or someone else did not already call. Note that abdominal thrusts (below the rib cage) should be used for a pregnant woman if possible. This should be possible until the later stages of pregnancy when there is no longer enough room between the enlarging uterus  and the rib cage to perform the maneuver. At that point, chest thrusts must be used as described. IF YOU ARE CHOKING: 4. Call local emergency services (911 in [place] if near a landline. Do not worry about communicating what is happening. Do not hang up the phone. Someone may be sent to help you anyway. 5. Make a fist with 1 hand. Put the thumb side of the fist against your stomach,  just above the belly button and well below the breastbone. If you are pregnant or obese, put your fist on your chest instead, just below the breastbone and just above your lowest ribs. 6. Hold your fist with your other hand and bend over a hard surface, such as a table or chair. 7. Forcefully push your fist in and up. 8. Continue to do this until the food or object comes out. PREVENTION  To be prepared if choking occurs, learn how to correctly perform abdominal thrusts and give CPR by taking a certified first-aid training course.  SEEK IMMEDIATE MEDICAL CARE IF:  You have a fever after choking stops.  You have problems breathing after choking stops.  You received the Heimlich maneuver. MAKE SURE YOU:  Understand these instructions.  Watch your condition.  Get help right away if you are not doing well or get worse. Document Released: 06/17/2004 Document Revised: 09/24/2013 Document Reviewed: 12/21/2011 [facility] Patient Information 2015 [place], [place]. This information is not intended to replace advice given to you by your health care provider. Make sure you discuss any questions you have with your health care provider.

## 2014-05-07 NOTE — ED Provider Notes (Signed)
CSN: [id number]     Arrival date & time 05/07/14  2107 History   First MD Initiated Contact with Patient 05/07/14 2136     Chief Complaint  Patient presents with  . Foreign body in throat       (Consider location/radiation/quality/duration/timing/severity/associated sxs/prior Treatment) HPI  73 year old female presents after choking on a pill (a vitamin) about one hour prior to arrival. She states she was ingesting the pill and felt like she choked on it. She's been coughing ever since. Feels like her voice is different than normal. She has felt somewhat short of breath. Has been feeling intermittent burning in her chest as well. No vomiting. No hemoptysis. Patient states the pill has not come back out. She had a similar episode a few years ago except that she eventually coughed out and ibuprofen. This led to a rapidly progressive pneumonia that required her to be hospitalized. She is concerned that that is going to develop again. The patient has been able to drink water since this happened without vomiting. Has ingested other pills without issues since the incident.  Past Medical History  Diagnosis Date  . Arthritis   . Blood transfusion without reported diagnosis   . Cataract   . GERD (gastroesophageal reflux disease)   . Hyperlipidemia   . Chronic kidney disease     hx bladder infections  . Neuromuscular disorder     chronic muscle pain- takes Progesterone  . Neuromuscular disease     fibromyalgia  . Fibromyalgia   . History of migraine headaches   . Hypothyroidism   . Gastric polyps   . Adrenal gland dysfunction      dr Vincente Poli, [facility]    Past Surgical History  Procedure Laterality Date  . Tonsillectomy    . Rhinoplasty    . Abdominal hysterectomy    . Appendectomy      with hysterectomy  . Cholecystectomy    . Hand surgery      middle finger right hand  . Rectocele repair    . Incontinence surgery    . Colonoscopy    . Cataract surgery     Family History   Problem Relation Age of Onset  . Colon cancer Mother   . Esophageal cancer Father   . Stomach cancer Father   . Rectal cancer Neg Hx   . Hypertension Father   . Hypertension Mother   . Breast cancer Mother   . Heart attack Paternal Grandfather   . Diabetes Paternal Grandmother   . Liver cancer Maternal Grandmother    History  Substance Use Topics  . Smoking status: Never Smoker   . Smokeless tobacco: Never Used  . Alcohol Use: Yes     Comment: 2 ounce red wine 3-4 wks   OB History    No data available     Review of Systems  Constitutional: Negative for fever.  HENT: Positive for voice change. Negative for trouble swallowing.   Respiratory: Positive for cough and shortness of breath. Negative for stridor.   Cardiovascular: Positive for chest pain.  Gastrointestinal: Negative for vomiting.  All other systems reviewed and are negative.     Allergies  Imitrex; Keflex; and Nsaids  Home Medications   Prior to Admission medications   Medication Sig Start Date End Date Taking? Authorizing Provider  butalbital-aspirin-caffeine [facility]) 50-325-40 MG per capsule Take 1 capsule by mouth. As needed    Historical Provider, MD  Cholecalciferol (VITAMIN D3) 1000 UNITS CAPS Take 1 capsule by  mouth 2 (two) times daily.    Historical Provider, MD  CLIMARA 0.1 MG/24HR Place 1 patch onto the skin once a week. monday 03/03/12   Historical Provider, MD  clobetasol (TEMOVATE) 0.05 % external solution Apply 1 application topically 2 (two) times daily. As needed    Historical Provider, MD  CRESTOR 5 MG tablet Take 5 mg by mouth daily.  03/02/12   Historical Provider, MD  fluocinonide (LIDEX) 0.05 % external solution Apply 1 application topically 2 (two) times daily. As needed    Historical Provider, MD  Multiple Vitamins-Minerals (PRESERVISION AREDS 2 PO) Take 1 tablet by mouth 2 (two) times daily.    Historical Provider, MD  NON FORMULARY Take 100 mg by mouth at bedtime. Progesterone  SR 100 mg every night    Historical Provider, MD  NON FORMULARY Testosterone versa based cream 0.4%  Applies 0.5 mL four times weekly    Historical Provider, MD  NON FORMULARY Corticare B 2 Capsules twice daily    Historical Provider, MD  Omega-3 Fatty Acids (FISH OIL) 1000 MG CAPS Take 1 capsule by mouth 2 (two) times daily.     Historical Provider, MD  predniSONE (DELTASONE) 10 MG tablet Take 1 tablet (10 mg total) by mouth daily with breakfast. 6 tablets divided TID, for 6 days ( 36) followed by 10mg  one  Tab tid po for 6 days (18)  Followed by one in AM for 6 days. 11/08/13   Porfirio Mylararmen Dohmeier, MD  SULFAZINE 500 MG tablet 1 tablet daily. Will build up to taking 2 tabs twice daily. 11/06/13   Historical Provider, MD  SYNTHROID 50 MCG tablet Take 50 mcg by mouth every morning.  02/22/12   Historical Provider, MD  triamterene-hydrochlorothiazide (MAXZIDE) 75-50 MG per tablet Take 1 tablet by mouth daily as needed (for fluid buildup).  01/19/12   Historical Provider, MD   BP 144/73 mmHg  Pulse 121  Temp(Src) 97.3 F (36.3 C) (Axillary)  Resp 33  Ht 5\' 6"  (1.676 m)  Wt 132 lb (59.875 kg)  BMI 21.32 kg/m2  SpO2 97% Physical Exam  Constitutional: She is oriented to person, place, and time. She appears well-developed and well-nourished.  HENT:  Head: Normocephalic and atraumatic.  Right Ear: External ear normal.  Left Ear: External ear normal.  Nose: Nose normal.  Eyes: Right eye exhibits no discharge. Left eye exhibits no discharge.  Cardiovascular: Normal rate, regular rhythm and normal heart sounds.   Pulmonary/Chest: Effort normal. She has wheezes in the right lower field.  Abdominal: She exhibits no distension.  Neurological: She is alert and oriented to person, place, and time.  Skin: Skin is warm and dry.  Nursing note and vitals reviewed.   ED Course  Procedures (including critical care time) Labs Review Labs Reviewed  CBC WITH DIFFERENTIAL - Abnormal; Notable for the  following:    MCV 104.2 (*)    All other components within normal limits  BASIC METABOLIC PANEL - Abnormal; Notable for the following:    GFR calc non Af Amer 86 (*)    Anion gap 16 (*)    All other components within normal limits  CBG MONITORING, ED - Abnormal; Notable for the following:    Glucose-Capillary 123 (*)    All other components within normal limits    Imaging Review Dg Chest 2 View  05/07/2014   CLINICAL DATA:  Pill aspiration with dyspnea  EXAM: CHEST  2 VIEW  COMPARISON:  None currently available  FINDINGS: Normal  heart size and mediastinal contours. Mild biapical pleural scarring. No acute infiltrate or edema. No effusion or pneumothorax. No acute osseous findings.  IMPRESSION: No active cardiopulmonary disease.   Electronically Signed   By: Tiburcio PeaJonathan  Watts M.D.   On: 05/07/2014 23:25     EKG Interpretation None      MDM   Final diagnoses:  Aspiration of foreign body, initial encounter    Patient has no increased work of breathing or hypoxia here. Sats have remained over 92% during ED stay. Feels like her voice is hoarse, but no stridor or respiratory distress. No evidence of PNA on CXR. She feels like her symptoms have improved while resting in ED. D/w pulmonology, Dr. Molli KnockYacoub, who recommends no further treatment or workup as she has no respiratory distress or obvious pneumonia. Given it is a pill he feels it should dissolve on its own. As patient remains well, will d/c home with f/u with PCP and/or pulmonology and discussed strict return precautions.    Audree CamelScott T Teira Arcilla, MD 05/07/14 [phone]

## 2014-05-07 NOTE — ED Notes (Addendum)
Pt presents with c/o airway obstruction by a pill. Pt has been coughing since she attempted to swallow the pill. Pt tried to drink some water and was unable to keep it down. Pt is able to talk in complete sentences, O2 92% on RA at this time.

## 2014-06-18 ENCOUNTER — Other Ambulatory Visit: Payer: Self-pay | Admitting: Family Medicine

## 2014-06-18 ENCOUNTER — Ambulatory Visit
Admission: RE | Admit: 2014-06-18 | Discharge: 2014-06-18 | Disposition: A | Discharge status: Home / Self Care | Payer: Medicare PPO | Source: Ambulatory Visit | Attending: Family Medicine | Admitting: Family Medicine

## 2014-06-18 DIAGNOSIS — R059 Cough, unspecified: Secondary | ICD-10-CM

## 2014-06-18 DIAGNOSIS — R05 Cough: Secondary | ICD-10-CM

## 2014-07-09 ENCOUNTER — Encounter: Payer: Self-pay | Admitting: Psychology

## 2014-07-09 ENCOUNTER — Ambulatory Visit: Payer: Medicare PPO | Attending: Psychology | Admitting: Psychology

## 2014-07-09 DIAGNOSIS — F801 Expressive language disorder: Secondary | ICD-10-CM | POA: Diagnosis not present

## 2014-07-09 DIAGNOSIS — R413 Other amnesia: Secondary | ICD-10-CM

## 2014-07-09 NOTE — Progress Notes (Addendum)
Initial Contact Note  Name: [name] MRN: [id number] Date: 07/09/2014  Cheryl BombardMaryellen E [name] is an 74 y.o. right handed female who was referred for neuropsychological evaluation by Melvyn Novasarmen Dohmeier, MD due to problems with memory loss and expressive language..   A total of 5 hours was spent today reviewing medical records, interviewing (CPT [phone]) [name] and administering and scoring neurocognitive tests (CPT [phone]).  There were no concerns expressed or behaviors displayed by [name] that would require immediate attention.   A full report will follow once the planned testing has been completed. Her next appointment is scheduled for 07/16/14.   Gladstone PihMichael F. Dakwan Pridgen, Ph.D 07/09/2014

## 2014-07-15 NOTE — Progress Notes (Addendum)
[name], Ph.D [facility]  [address]         Telephone [phone] [address]        Fax [phone] [place], [place] [address]   NEUROPSYCHOLOGICAL EVALUATION     *CONFIDENTIAL* This report should not be released without the consent of the client  Name:   [name] Date of Birth:  [birth year] [facility] MR#:  [id number] Date of Evaluation: 07/09/14        Reason for Referral [name] is a 74 year-old, right-handed woman who was referred by Alen Blew MD of [facility] for neuropsychological evaluation as prompted by [name]'s recent complaints of expressive language difficulties marked by neologisms and paraphasic errors along with a long history of memory difficulties.   A brain MRI scan on 09/19/13 was normal. A brain PET CT scan on 11/05/13 was normal and inconsistent for Alzheimer's disease.  Ms. [name] was previously evaluated by the undersigned clinician in February 2010 due to her complaint of memory difficulties. At that time, she had reported an approximate seven year history of mild memory difficulties that had not disrupted her abilities to perform her activities of daily living or work. Results of neuropsychological testing were within normal expectations for her age as well as commensurate to her estimated level of general cognitive functioning. She performed within the High Average to Very Superior ranges on tests of memory. It was concluded that her subjective cognitive difficulties were likely related to a combination of work stress and the effects of pain and fatigue.  Sources of Information Medical Records available from [facility] electronic medical records were reviewed. Ms. Divirgilio was interviewed.   Chief Complaints [name] reported that sometime in the spring of 2015 she began to notice having difficulties expressing herself verbally. She gave examples of  leaving the ends off of words or substituting incorrect endings, usually while saying multisyllabic words (e.g., using the ending "tion" instead of "tive") or substituting words (e.g., "change her doctor" versus "change her diaper"). She has not noticed any problems while writing.   She reported that these expressive language difficulties manifested a few weeks after she began to experience sharply increased abdominal pain that was eventually attributed to psoriatic arthritis and adrenal insufficiency. She noted that the frequency of her word usage difficulties began to decline shortly after she was put on Sulfasalazine in November 2015 that has substantially reduced her pain. She reported that her expressive skills have normalized as of two weeks ago.   She reported that she had experienced similar expressive language difficulties in 2010 that spontaneously disappeared after a few months.  She continues to report ongoing memory difficulties, most often exemplified by not being able to recall details from articles she had recently read or from conversations. She stated her belief that her mental acuity, verbal facility and memory seem to vary on the basis of how well she had slept the previous night. She reported occasional early morning awakenings.  She currently denies problems with sensory-perceptual skills, mobility, or vegetative functioning. She reported continued albeit less intense diffuse pain that has varied in location but has been most consistently felt in her neck, buttocks, arms and legs.  With regards to mood, she did not report feeling depressed though acknowledged feeling overwhelmed and anxious at times in reaction to her husband having been diagnosed with terminal small cell lung cancer in December 2015. She reported having maintained an active and satisfying lifestyle  since her retirement in 2013.   She reported that's she continues to perform her basic and instrumental activities of  daily living without difficulty.  Background She resides with her second husband, to whom she has been married to for twenty five years. As noted above, he was recently diagnosed with lung cancer. She has two adult children from her first marriage. She had divorced her first husband after twenty-three years of marriage.  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 She stated that she had earned a Master's Degree in Library and Information Studies. She denied history of school-based attentional or learning difficulties.  With regards to her medical history, she reported that she was recently diagnosed with psoriatic arthritis, which was initially thought to be Fibromyalgia. Her current medications include Climara patch (since undergoing a hysterectomy at the age of 42thirty-five), Crestor for high cholesterol since 2006. Fiorinal as needed for migraine headache, Maxide, Sulfazine and Synthroid for hypothyroidism that diagnosed in 1998. She reported no history of head trauma, loss of consciousness, stroke-like symptom, seizures or substance abuse.   Her family medical history was notable for her maternal grandmother and maternal aunt having been diagnosed with dementia in their 2280s. She described her two children as recovering alcoholics.  She reported no history of emotional difficulties. She had been involved in brief psychological counseling a few years ago regarding her children's history of alcohol abuse.   Observations She appeared as an appropriately dressed and groomed woman in no apparent distress. She related in a consistently pleasant and cooperative manner. She did not display any abnormalities of alertness, speech, communicative skills or impulse control. Her affect appeared within a wide range and without lability. Her mood seemed mildly anxious. No problems were evident for speech articulation, prosody, word  finding, word selection, message coherence or language comprehension. Her thought processes were coherent and organized. There was no evidence of confusion, bizarre or unusual thought content, perseverative behavior, delusional thinking or hallucinations.  Evaluation Procedures Animal Naming Test  CraigBoston Naming Test Controlled Oral Word Association Test  Geriatric Anxiety Scale  Geriatric Depression Scale  Rey Complex Figure: copy Trail Making A & B Wechsler Adult Intelligence Scale-IV:  Digit Span, Coding, Matrix Reasoning & Similarities  Wechsler Memory Scale- IV Older Adult Battery  Test Results & Interpretation Test results were considered to reflect a valid representation of her cognitive functioning. She appeared consistently alert and focused. She did not display any signs of physical or emotional distress. She did not report or display problems with vision (she wore her eyeglasses), hearing or motor control. She readily grasped task instructions. She appeared to expend optimal effort. Her test scores were corrected to reflect norms for her age and, whenever possible, her gender and educational level (i.e., 18 years).   Her performances on the neuropsychological battery fell entirely within normal expectations.   She scored within the Very Superior range (98th percentile or better) on tests of auditory working memory (Wechsler Adult Intelligence Scale-IV (WAIS-IV) Digit Span), delayed memory (WMS-IV Older Adult Battery) and verbal conceptual reasoning (WAIS-IV Similarities).   She scored within the Superior range (92nd - 97th percentile) on tests of set shifting (Trails B), immediate (Wechsler Memory Scale- IV (WMS-IV) Older Adult Battery) and visuospatial assembly Counsellor(WAIS-IV Block Design).   She scored within the High Average range (76th - 91st percentile) on tests of visual processing speed (WAIS-IV Coding), naming to confrontation [employer]([place] Naming  Test), complex drawing (Rey Complex  Figure) and nonverbal reasoning (WAIS-IV Matrix Reasoning).   She scored within the Average range (25th - 75th percentile) on measures of visual scanning/sequencing speed (Trails A) and verbal fluency (Animal Naming Test; Controlled Oral Word Association Test). Please see the attached summary sheet for specific test scores.  She reported a negligible level of affective distress on standardized symptom questionnaires. Her score of 2/30 on the Geriatric Depression Scale fell within the normal or non-depressed range. Her score of 4/75 on the Geriatric Anxiety Scale indicated a minimal level of anxiety.    Summary & Conclusions Ms. Jasnoor Trussell [place] reported that in the spring of 2015 she began to have expressive language difficulties marked by incorrect word usage and paraphasic errors. These difficulties manifested a few weeks after she began to experience sharply increased abdominal pain that was eventually attributed to psoriatic arthritis. She reported that the frequency of her word usage errors as well as level of pain declined shortly after she was put on Sulfasalazine in November 2015. She reported that her expressive language skills have normalized as of two weeks ago.   Her performance on the current neuropsychological battery fell entirely within normal expectations and was commensurate with her previously measured Superior intellectual level in 2010. It is worth noting that measures of her immediate and delayed memory functioning fell within the Superior and Very Superior ranges, respectively. There were no qualitative or quantitative signs of language disorder.   With regards to her psychological functioning, she endorsed a negligible level of affective distress on standardized symptom questionnaires. She did report that she has felt saddened and at times overwhelmed by her husband's recent diagnosis of terminal lung cancer but stated her belief that she has been adequately coping.    In  conclusion, neuropsychological test results did not indicate a cognitive or language disorder.  The results of this evaluation as well as the effects of normal aging were discussed with her on 07/15/14.   We have appreciated the opportunity to again evaluate Ms. Shifflett.  Please feel free to contact me with any comments or questions.  ___________________ [name], Ph.D Licensed Psychologist    Addendum-Neuropsychological Test Results            Name:   Albina Gosney Minami Date of Birth:   11-Nov-2040 [facility] G[facility] MR#: [id number] Date of Evaluation: 07/09/14   Animal Naming Test Score=22    58th (adjusted for age, gender and educational level)   Boston Naming Test Score= 58/60    82nd (adjusted for age, gender and educational level)   Controlled Oral Word Association Test 49 words/0 repetitions  58th (adjusted for age, gender and educational level)   Rey Complex Figure: copy      Score= 33/36    normal  Trails A      25s  0e    70th (adjusted for age, gender and educational level) Trails B          44s  0e    93rd (adjusted for age, gender and educational level)   Wechsler Adult Intelligence Scale-IV (WAIS-IV)        Age-corrected scaled score         percentile     Block Design    15    95th   Similarities    16     98th      Digit Span    16    98th      Matrix Reasoning  13    84th       Coding      14    91st        Wechsler Memory Scale-IV Older Adult Battery (WMS-IV)  Index                        Index Score              Percentile            Immediate Memory    122   93rd                   Auditory Memory    124   95th                                  Visual Memory    125   95th                             Delayed Memory     135   99th

## 2014-07-16 ENCOUNTER — Ambulatory Visit: Payer: Medicare PPO | Admitting: Psychology

## 2014-08-06 ENCOUNTER — Telehealth: Payer: Self-pay | Admitting: *Deleted

## 2014-08-06 NOTE — Telephone Encounter (Signed)
LMVM for pt to reschedule appt from 09-11-14 to another date due to Dr. Vickey Hugerohmeier schedule change.   May speak to operator or myself.

## 2014-08-21 NOTE — Telephone Encounter (Signed)
Patient returned call to r/s. Patient is rescheduled.

## 2014-09-11 ENCOUNTER — Ambulatory Visit: Payer: Medicare PPO | Admitting: Neurology

## 2014-09-24 ENCOUNTER — Ambulatory Visit: Payer: Self-pay | Admitting: Neurology

## 2015-01-02 DIAGNOSIS — Z79890 Hormone replacement therapy (postmenopausal): Secondary | ICD-10-CM | POA: Diagnosis not present

## 2015-01-02 DIAGNOSIS — E039 Hypothyroidism, unspecified: Secondary | ICD-10-CM | POA: Diagnosis not present

## 2015-01-02 DIAGNOSIS — N951 Menopausal and female climacteric states: Secondary | ICD-10-CM | POA: Diagnosis not present

## 2015-01-02 DIAGNOSIS — E785 Hyperlipidemia, unspecified: Secondary | ICD-10-CM | POA: Diagnosis not present

## 2015-01-02 DIAGNOSIS — M1990 Unspecified osteoarthritis, unspecified site: Secondary | ICD-10-CM | POA: Diagnosis not present

## 2015-01-02 DIAGNOSIS — Z6822 Body mass index (BMI) 22.0-22.9, adult: Secondary | ICD-10-CM | POA: Diagnosis not present

## 2015-01-21 DIAGNOSIS — M25551 Pain in right hip: Secondary | ICD-10-CM | POA: Diagnosis not present

## 2015-01-21 DIAGNOSIS — M1380 Other specified arthritis, unspecified site: Secondary | ICD-10-CM | POA: Diagnosis not present

## 2015-01-21 DIAGNOSIS — G8929 Other chronic pain: Secondary | ICD-10-CM | POA: Diagnosis not present

## 2015-01-21 DIAGNOSIS — Z79899 Other long term (current) drug therapy: Secondary | ICD-10-CM | POA: Diagnosis not present

## 2015-01-24 DIAGNOSIS — Z1231 Encounter for screening mammogram for malignant neoplasm of breast: Secondary | ICD-10-CM | POA: Diagnosis not present

## 2015-01-24 DIAGNOSIS — M899 Disorder of bone, unspecified: Secondary | ICD-10-CM | POA: Diagnosis not present

## 2015-01-24 DIAGNOSIS — M810 Age-related osteoporosis without current pathological fracture: Secondary | ICD-10-CM | POA: Diagnosis not present

## 2015-01-28 DIAGNOSIS — M797 Fibromyalgia: Secondary | ICD-10-CM | POA: Diagnosis not present

## 2015-01-28 DIAGNOSIS — E780 Pure hypercholesterolemia: Secondary | ICD-10-CM | POA: Diagnosis not present

## 2015-01-28 DIAGNOSIS — M1389 Other specified arthritis, multiple sites: Secondary | ICD-10-CM | POA: Diagnosis not present

## 2015-01-28 DIAGNOSIS — Z634 Disappearance and death of family member: Secondary | ICD-10-CM | POA: Diagnosis not present

## 2015-01-28 DIAGNOSIS — E039 Hypothyroidism, unspecified: Secondary | ICD-10-CM | POA: Diagnosis not present

## 2015-01-28 DIAGNOSIS — Z23 Encounter for immunization: Secondary | ICD-10-CM | POA: Diagnosis not present

## 2015-03-23 IMAGING — CR DG CHEST 2V
2 series · 2 of 2 positions shown · non-contrast
Comparison: Chest x-ray of 05/07/2014

CLINICAL DATA: Cough, history of aspiration previously

EXAM:
CHEST  2 VIEW

[view not recorded (1 of 2)]
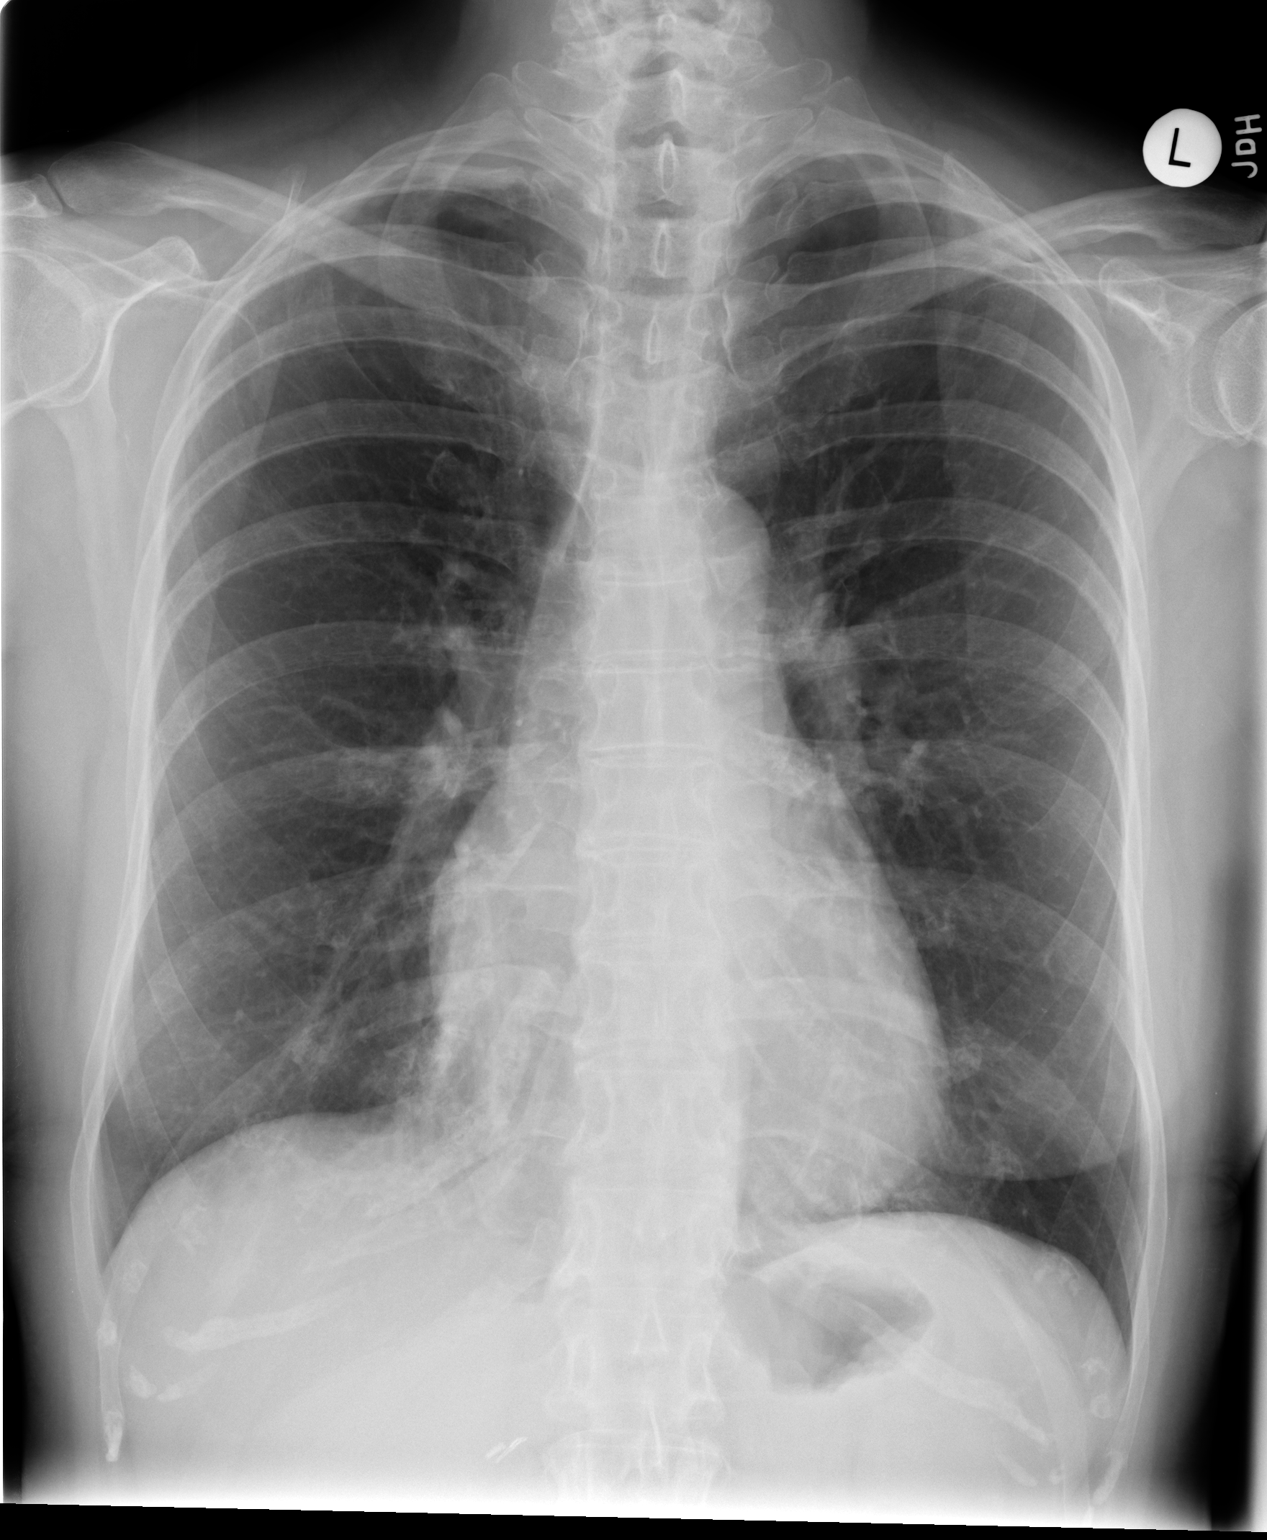

[view not recorded (2 of 2)]
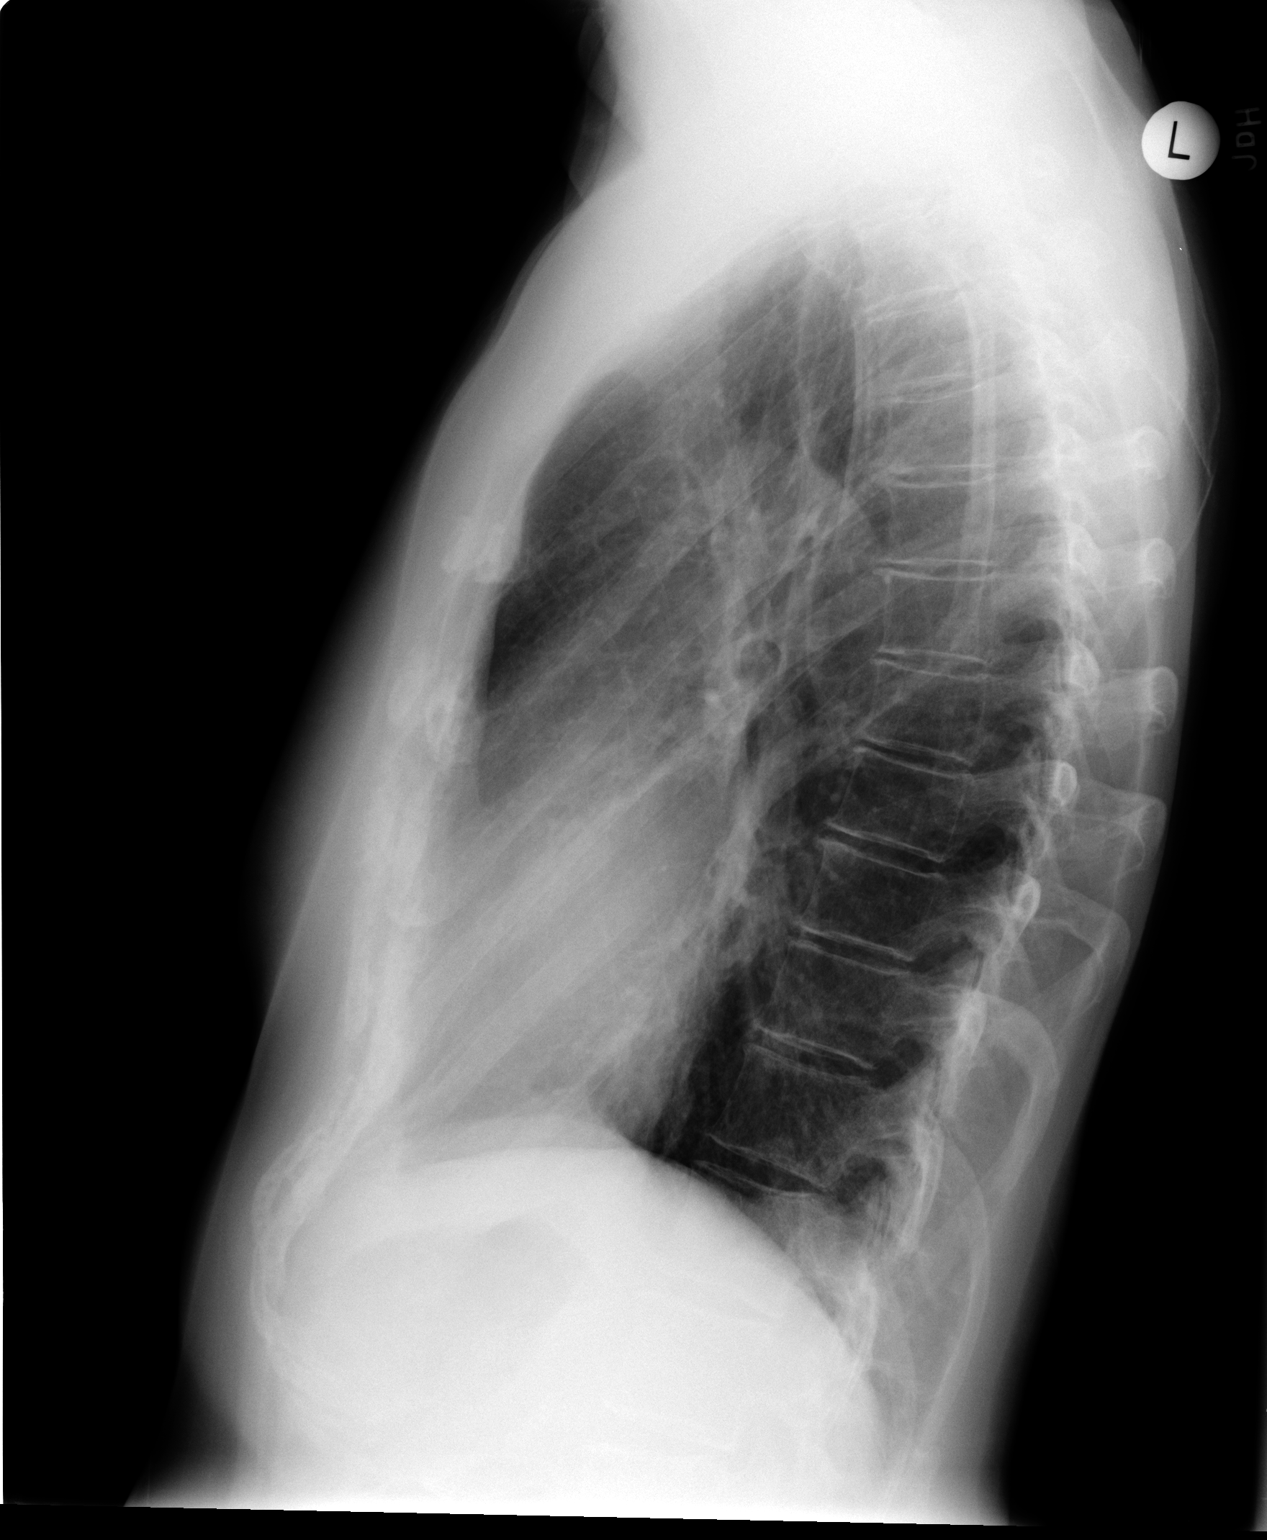

[2 of 2 positions shown; findings below may reference images not displayed]

FINDINGS: The lungs remain slightly hyperaerated. On the lateral view the
slight lobulation of the post hemidiaphragm was present previously
and probably represents a focal eventration and is of doubtful
significance. Mild biapical pleural thickening remains. Mediastinal
and hilar contours are unremarkable. The heart is within normal
limits in size. No bony abnormality is seen.
IMPRESSION: No active cardiopulmonary disease.

## 2015-03-26 DIAGNOSIS — M25551 Pain in right hip: Secondary | ICD-10-CM | POA: Diagnosis not present

## 2015-03-26 DIAGNOSIS — G8929 Other chronic pain: Secondary | ICD-10-CM | POA: Diagnosis not present

## 2015-03-26 DIAGNOSIS — M1380 Other specified arthritis, unspecified site: Secondary | ICD-10-CM | POA: Diagnosis not present

## 2015-03-26 DIAGNOSIS — Z79899 Other long term (current) drug therapy: Secondary | ICD-10-CM | POA: Diagnosis not present

## 2015-04-03 DIAGNOSIS — M76891 Other specified enthesopathies of right lower limb, excluding foot: Secondary | ICD-10-CM | POA: Diagnosis not present

## 2015-04-03 DIAGNOSIS — M13851 Other specified arthritis, right hip: Secondary | ICD-10-CM | POA: Diagnosis not present

## 2015-04-24 DIAGNOSIS — M79601 Pain in right arm: Secondary | ICD-10-CM | POA: Diagnosis not present

## 2015-04-30 DIAGNOSIS — H35372 Puckering of macula, left eye: Secondary | ICD-10-CM | POA: Diagnosis not present

## 2015-04-30 DIAGNOSIS — H5213 Myopia, bilateral: Secondary | ICD-10-CM | POA: Diagnosis not present

## 2015-04-30 DIAGNOSIS — H35363 Drusen (degenerative) of macula, bilateral: Secondary | ICD-10-CM | POA: Diagnosis not present

## 2015-04-30 DIAGNOSIS — Z79899 Other long term (current) drug therapy: Secondary | ICD-10-CM | POA: Diagnosis not present

## 2015-04-30 DIAGNOSIS — Z961 Presence of intraocular lens: Secondary | ICD-10-CM | POA: Diagnosis not present

## 2015-06-05 ENCOUNTER — Other Ambulatory Visit (HOSPITAL_COMMUNITY): Payer: Self-pay | Admitting: Orthopedic Surgery

## 2015-06-13 ENCOUNTER — Other Ambulatory Visit: Payer: Self-pay

## 2015-06-13 ENCOUNTER — Encounter (HOSPITAL_COMMUNITY): Payer: Self-pay

## 2015-06-13 ENCOUNTER — Encounter (HOSPITAL_COMMUNITY)
Admission: RE | Admit: 2015-06-13 | Discharge: 2015-06-13 | Disposition: A | Discharge status: Home / Self Care | Payer: Medicare Other | Source: Ambulatory Visit | Attending: Orthopedic Surgery | Admitting: Orthopedic Surgery

## 2015-06-13 DIAGNOSIS — Z01812 Encounter for preprocedural laboratory examination: Secondary | ICD-10-CM | POA: Diagnosis not present

## 2015-06-13 DIAGNOSIS — Z01818 Encounter for other preprocedural examination: Secondary | ICD-10-CM | POA: Insufficient documentation

## 2015-06-13 DIAGNOSIS — M75101 Unspecified rotator cuff tear or rupture of right shoulder, not specified as traumatic: Secondary | ICD-10-CM | POA: Insufficient documentation

## 2015-06-13 DIAGNOSIS — I491 Atrial premature depolarization: Secondary | ICD-10-CM | POA: Diagnosis not present

## 2015-06-13 HISTORY — DX: Headache: R51

## 2015-06-13 HISTORY — DX: Other specified postprocedural states: Z98.890

## 2015-06-13 HISTORY — DX: Headache, unspecified: R51.9

## 2015-06-13 HISTORY — DX: Cardiac arrhythmia, unspecified: I49.9

## 2015-06-13 HISTORY — DX: Pneumonia, unspecified organism: J18.9

## 2015-06-13 HISTORY — DX: Nausea with vomiting, unspecified: R11.2

## 2015-06-13 LAB — CBC
HCT: 42.1 % (ref 36.0–46.0)
Hemoglobin: 13.9 g/dL (ref 12.0–15.0)
MCH: 33.5 pg (ref 26.0–34.0)
MCHC: 33 g/dL (ref 30.0–36.0)
MCV: 101.4 fL — AB (ref 78.0–100.0)
PLATELETS: 148 10*3/uL — AB (ref 150–400)
RBC: 4.15 MIL/uL (ref 3.87–5.11)
RDW: 12.9 % (ref 11.5–15.5)
WBC: 6 10*3/uL (ref 4.0–10.5)

## 2015-06-13 LAB — BASIC METABOLIC PANEL
ANION GAP: 9 (ref 5–15)
BUN: 14 mg/dL (ref 6–20)
CALCIUM: 9.3 mg/dL (ref 8.9–10.3)
CO2: 29 mmol/L (ref 22–32)
CREATININE: 0.81 mg/dL (ref 0.44–1.00)
Chloride: 103 mmol/L (ref 101–111)
GLUCOSE: 99 mg/dL (ref 65–99)
Potassium: 4.4 mmol/L (ref 3.5–5.1)
Sodium: 141 mmol/L (ref 135–145)

## 2015-06-13 NOTE — Pre-Procedure Instructions (Signed)
Shelly Bombard [place]  06/13/2015      [id number] - [place], [place] - [address] [place] [address] Phone: [phone] Fax: [phone]    Your procedure is scheduled on Tuesday January 24th 2017 at 1200 PM.  Report to [facility] Admitting at 1000 A.M.  Call this number if you have problems the morning of surgery:  [phone]   Remember:  Do not eat food or drink liquids after midnight Monday Jan 23rd.  Take these medicines the morning of surgery with A SIP OF WATER hydrocodone-acetaminophen (vicodin) if needed, synthroid  STOP: ALL Vitamins, Supplements, Effient and Herbal Medications, Fish Oils, Aspirins, NSAIDs (Nonsteroidal Anti-inflammatories such as Ibuprofen, Aleve, or Advil), and Goody's/BC Powders 7 days prior to surgery, until after surgery as directed by your physician.    Do not wear jewelry, make-up or nail polish.  Do not wear lotions, powders, or perfumes.  You may wear deodorant.  Do not shave 48 hours prior to surgery.  Men may shave face and neck.  Do not bring valuables to the hospital.  [facility] is not responsible for any belongings or valuables.  Contacts, dentures or bridgework may not be worn into surgery.  Leave your suitcase in the car.  After surgery it may be brought to your room.  For patients admitted to the hospital, discharge time will be determined by your treatment team.  Patients discharged the day of surgery will not be allowed to drive home.        Preparing for Surgery at [facility]  Before surgery, you can play an important role.  Because skin is not sterile, your skin needs to be as free of germs as possible.  You can reduce the number of germs on your skin by washing with CHG (chlorahexidine gluconate) Soap before surgery.  CHG is an antiseptic cleaner with kills germs and bonds with the skin to continue killing germs even after washing.   Please do not use  if you have an allergy to CHG or antibacterial soaps.  If your skin becomes reddened/irritated stop using the CHG.  Do not shave (including legs and underarms) for at least 48 hours prior to first CHG shower.  It is okay to shave your face.  Please follow these instructions carefully:  1. Shower with CHG Soap the night before surgery and the morning of Surgery. 2. If you choose to wash your hair, wash your hair first as usual with your normal shampoo. 3. After you shampoo, rinse your hair and body thoroughly to remove the Shampoo. 4. Use CHG as you would any other liquid soap. You can apply chg directly to the skin and wash gently with scrungie or a clean washcloth. 5. Apply the CHG Soap to your body ONLY FROM THE NECK DOWN. Do not use on open wounds or open sores. Avoid contact with your eyes, ears, mouth and genitals (private parts). Wash genitals (private parts) with your normal soap. 6. Wash thoroughly, paying special attention to the area where your surgery will be performed. 7. Thoroughly rinse your body with warm water from the neck down. 8. DO NOT shower/wash with your normal soap after using and rinsing off the CHG Soap. 9. Pat yourself dry with a clean towel.  10. Wear clean pajamas.  11. Place clean sheets on your bed the night of your first shower and do not sleep with pets.  Day of Surgery  Do  not apply any lotions/deodorants the morning of surgery. Please wear clean clothes to the hospital/surgery center.   Please read over the following fact sheets that you were given. Pain Booklet, Coughing and Deep Breathing and Surgical Site Infection Prevention

## 2015-06-13 NOTE — Progress Notes (Signed)
PCP is Allean Found, MD   EKG: 06/13/15 Echo: 2004 Stress: 2004 Cath: Denies  Patient fell on hard cement in 1996, felt she had a dysrhythmia after this injury.  Had full cardiac workup then in '96.  Had work up again in 2004 but can't remember why.  States cardiologist says everything was fine and 'she was great'.  Has not followed or needed follow up since then.  This was done with PCP Birdie Sons at the time [facility]).

## 2015-06-16 MED ORDER — CHLORHEXIDINE GLUCONATE 4 % EX LIQD: 60.0000 mL | Freq: Once | CUTANEOUS | Status: DC

## 2015-06-16 MED ORDER — CLINDAMYCIN PHOSPHATE 900 MG/50ML IV SOLN
900.0000 mg | INTRAVENOUS | Status: AC
  Administered 2015-06-17: 900 mg via INTRAVENOUS
  Filled 2015-06-16: qty 50

## 2015-06-16 NOTE — Progress Notes (Signed)
Called pt to notify her of surgery time change, pt not at home, spoke with her son. Instructed him that pt needs to arrive at 6:30 AM tomorrow. He voiced understanding.

## 2015-06-16 NOTE — H&P (Signed)
[name] is an 75 y.o. female.   Chief Complaint: Right shoulder pain HPI: [name] is a 75 year old female with long history of right shoulder pain. Staying on for several months. Reports weakness and pain with overhead motion as well as pain which is waking her from sleep at night. She has failed conservative measures including injection and rehabilitation. MRI scans consistent with mildly retracted rotator cuff tear. She really desires a higher level of functional ability. Her medical conditions do complicate decision making due to her immunosuppressive that she is taking. They have been managed by her rheumatologist in the perioperative period  Past Medical History  Diagnosis Date  . Arthritis   . Blood transfusion without reported diagnosis   . Cataract   . GERD (gastroesophageal reflux disease)   . Hyperlipidemia   . Chronic kidney disease     hx bladder infections  . Neuromuscular disorder (HCC)     chronic muscle pain- takes Progesterone  . Neuromuscular disease (HCC)     fibromyalgia  . History of migraine headaches   . Hypothyroidism   . Gastric polyps   . Adrenal gland dysfunction (HCC)      dr Vincente Poli, [facility]   . Chronic kidney disease   . PONV (postoperative nausea and vomiting)   . Dysrhythmia     wore holter monitor  . Pneumonia   . chronic headaches     Past Surgical History  Procedure Laterality Date  . Tonsillectomy    . Rhinoplasty    . Abdominal hysterectomy    . Appendectomy      with hysterectomy  . Cholecystectomy    . Hand surgery      middle finger right hand  . Rectocele repair    . Incontinence surgery    . Colonoscopy    . Cataract surgery    . Cardiac catheterization    . Eye surgery      cataract BIL    Family History  Problem Relation Age of Onset  . Colon cancer Mother   . Esophageal cancer Father   . Stomach cancer Father   . Rectal cancer Neg Hx   . Hypertension Father   . Hypertension Mother   . Breast cancer  Mother   . Heart attack Paternal Grandfather   . Diabetes Paternal Grandmother   . Liver cancer Maternal Grandmother    Social History:  reports that she quit smoking about 22 years ago. She has never used smokeless tobacco. She reports that she drinks alcohol. She reports that she does not use illicit drugs.  Allergies:  Allergies  Allergen Reactions  . Imitrex [Sumatriptan]     Made BP go very high   . Keflex [Cephalexin] Nausea And Vomiting  . Nsaids Other (See Comments)    Genella Rife as result taking.    No prescriptions prior to admission    No results found for this or any previous visit (from the past 48 hour(s)). No results found.  Review of Systems  Constitutional: Negative.   HENT: Negative.   Eyes: Negative.   Cardiovascular: Negative.   Gastrointestinal: Negative.   Genitourinary: Negative.   Musculoskeletal: Positive for joint pain.  Skin: Negative.   Neurological: Negative.   Endo/Heme/Allergies: Negative.   Psychiatric/Behavioral: Negative.    There were no vitals taken for this visit. Physical Exam  Constitutional: She appears well-developed.  HENT:  Head: Normocephalic.  Eyes: Pupils are equal, round, and reactive to light.  Neck: Normal range of motion.  Cardiovascular: Normal rate.   Respiratory: Effort normal.  Neurological: She is alert.  Skin: Skin is warm.  Psychiatric: She has a normal mood and affect.  Examination of the right shoulder demonstrates weakness to supraspinatus over status testing right versus left impingement signs positive on the right negative on the left O'Brien's testing equivocal on the right negative on the left maintenance of passive range of motion is present with external rotation 15 of abduction to about 60 bilaterally there is some coarse grinding crepitus with active and passive range of motion of the right shoulder compared to the left motor sensory function to the right arm is intact     Assessment/Plan Impression  is right shoulder rotator cuff tear symptomatic despite conservative measures with retraction on MRI scan plan arthroscopy evaluation of the biceps tendon subacromial decompression with mini open rotator cuff tear repair risk and benefits discussed with the patient we will and to infection shoulder stiffness potential for rerupture and more surgery. Expected rehabilitation course also discussed. In general she is a little bit higher risk because of these immunosuppressive she's taking for her rheumatologic condition. Plan to use CPM machine postop. All questions answered  DEAN,GREGORY SCOTT 06/16/2015, 12:58 PM

## 2015-06-17 ENCOUNTER — Encounter (HOSPITAL_COMMUNITY)
Admission: RE | Disposition: A | Discharge status: Home / Self Care | Payer: Self-pay | Source: Ambulatory Visit | Attending: Orthopedic Surgery

## 2015-06-17 ENCOUNTER — Ambulatory Visit (HOSPITAL_COMMUNITY): Payer: Medicare Other | Admitting: Anesthesiology

## 2015-06-17 ENCOUNTER — Ambulatory Visit (HOSPITAL_COMMUNITY)
Admission: RE | Admit: 2015-06-17 | Discharge: 2015-06-17 | Disposition: A | Discharge status: Home / Self Care | Payer: Medicare Other | Source: Ambulatory Visit | Attending: Orthopedic Surgery | Admitting: Orthopedic Surgery

## 2015-06-17 ENCOUNTER — Encounter (HOSPITAL_COMMUNITY): Payer: Self-pay | Admitting: *Deleted

## 2015-06-17 DIAGNOSIS — M24011 Loose body in right shoulder: Secondary | ICD-10-CM | POA: Diagnosis not present

## 2015-06-17 DIAGNOSIS — K219 Gastro-esophageal reflux disease without esophagitis: Secondary | ICD-10-CM | POA: Insufficient documentation

## 2015-06-17 DIAGNOSIS — M75101 Unspecified rotator cuff tear or rupture of right shoulder, not specified as traumatic: Secondary | ICD-10-CM | POA: Insufficient documentation

## 2015-06-17 DIAGNOSIS — E039 Hypothyroidism, unspecified: Secondary | ICD-10-CM | POA: Diagnosis not present

## 2015-06-17 DIAGNOSIS — E785 Hyperlipidemia, unspecified: Secondary | ICD-10-CM | POA: Diagnosis not present

## 2015-06-17 DIAGNOSIS — Z87891 Personal history of nicotine dependence: Secondary | ICD-10-CM | POA: Insufficient documentation

## 2015-06-17 DIAGNOSIS — M1990 Unspecified osteoarthritis, unspecified site: Secondary | ICD-10-CM | POA: Insufficient documentation

## 2015-06-17 DIAGNOSIS — M797 Fibromyalgia: Secondary | ICD-10-CM | POA: Diagnosis not present

## 2015-06-17 DIAGNOSIS — Z5333 Arthroscopic surgical procedure converted to open procedure: Secondary | ICD-10-CM | POA: Diagnosis not present

## 2015-06-17 HISTORY — PX: SHOULDER ARTHROSCOPY WITH ROTATOR CUFF REPAIR AND SUBACROMIAL DECOMPRESSION: SHX5686

## 2015-06-17 SURGERY — SHOULDER ARTHROSCOPY WITH ROTATOR CUFF REPAIR AND SUBACROMIAL DECOMPRESSION
Anesthesia: Regional | Site: Shoulder | Laterality: Right

## 2015-06-17 MED ORDER — OXYCODONE-ACETAMINOPHEN 5-325 MG PO TABS: 1.0000 | ORAL_TABLET | Freq: Four times a day (QID) | ORAL | Status: DC | PRN

## 2015-06-17 MED ORDER — MIDAZOLAM HCL 2 MG/2ML IJ SOLN
INTRAMUSCULAR | Status: AC
  Administered 2015-06-17: 1 mg
  Filled 2015-06-17: qty 2

## 2015-06-17 MED ORDER — BUPIVACAINE HCL (PF) 0.25 % IJ SOLN
INTRAMUSCULAR | Status: AC
  Filled 2015-06-17: qty 30

## 2015-06-17 MED ORDER — LIDOCAINE HCL (CARDIAC) 20 MG/ML IV SOLN
INTRAVENOUS | Status: DC | PRN
  Administered 2015-06-17: 80 mg via INTRAVENOUS

## 2015-06-17 MED ORDER — 0.9 % SODIUM CHLORIDE (POUR BTL) OPTIME
TOPICAL | Status: DC | PRN
  Administered 2015-06-17: 1000 mL

## 2015-06-17 MED ORDER — PROMETHAZINE HCL 25 MG/ML IJ SOLN
INTRAMUSCULAR | Status: AC
  Filled 2015-06-17: qty 1

## 2015-06-17 MED ORDER — SCOPOLAMINE 1 MG/3DAYS TD PT72
1.0000 | MEDICATED_PATCH | TRANSDERMAL | Status: DC
  Administered 2015-06-17: 1.5 mg via TRANSDERMAL
  Filled 2015-06-17: qty 1

## 2015-06-17 MED ORDER — METHOCARBAMOL 500 MG PO TABS: 500.0000 mg | ORAL_TABLET | Freq: Four times a day (QID) | ORAL | Status: DC

## 2015-06-17 MED ORDER — PROPOFOL 10 MG/ML IV BOLUS
INTRAVENOUS | Status: AC
  Filled 2015-06-17: qty 20

## 2015-06-17 MED ORDER — FENTANYL CITRATE (PF) 100 MCG/2ML IJ SOLN
50.0000 ug | Freq: Once | INTRAMUSCULAR | Status: AC
  Administered 2015-06-17: 50 ug via INTRAVENOUS

## 2015-06-17 MED ORDER — MIDAZOLAM HCL 5 MG/ML IJ SOLN: 2.0000 mg | Freq: Once | INTRAMUSCULAR | Status: DC

## 2015-06-17 MED ORDER — DEXAMETHASONE SODIUM PHOSPHATE 10 MG/ML IJ SOLN
INTRAMUSCULAR | Status: DC | PRN
  Administered 2015-06-17: 10 mg via INTRAVENOUS

## 2015-06-17 MED ORDER — EPINEPHRINE HCL 1 MG/ML IJ SOLN
INTRAMUSCULAR | Status: AC
  Filled 2015-06-17: qty 1

## 2015-06-17 MED ORDER — SODIUM CHLORIDE 0.9 % IJ SOLN
INTRAMUSCULAR | Status: AC
  Filled 2015-06-17: qty 10

## 2015-06-17 MED ORDER — ONDANSETRON HCL 4 MG/2ML IJ SOLN
INTRAMUSCULAR | Status: AC
  Filled 2015-06-17: qty 2

## 2015-06-17 MED ORDER — HYDROMORPHONE HCL 1 MG/ML IJ SOLN: 0.2500 mg | INTRAMUSCULAR | Status: DC | PRN

## 2015-06-17 MED ORDER — FENTANYL CITRATE (PF) 250 MCG/5ML IJ SOLN
INTRAMUSCULAR | Status: AC
  Filled 2015-06-17: qty 5

## 2015-06-17 MED ORDER — FENTANYL CITRATE (PF) 100 MCG/2ML IJ SOLN
INTRAMUSCULAR | Status: AC
  Filled 2015-06-17: qty 2

## 2015-06-17 MED ORDER — LIDOCAINE HCL (CARDIAC) 20 MG/ML IV SOLN
INTRAVENOUS | Status: AC
  Filled 2015-06-17: qty 5

## 2015-06-17 MED ORDER — SODIUM CHLORIDE 0.9 % IJ SOLN
INTRAMUSCULAR | Status: DC | PRN
  Administered 2015-06-17 (×5): 10 mL

## 2015-06-17 MED ORDER — PROMETHAZINE HCL 25 MG/ML IJ SOLN
6.2500 mg | INTRAMUSCULAR | Status: DC | PRN
  Administered 2015-06-17: 6.25 mg via INTRAVENOUS

## 2015-06-17 MED ORDER — PHENYLEPHRINE HCL 10 MG/ML IJ SOLN
INTRAMUSCULAR | Status: DC | PRN
  Administered 2015-06-17: 40 ug via INTRAVENOUS
  Administered 2015-06-17: 80 ug via INTRAVENOUS

## 2015-06-17 MED ORDER — PROPOFOL 10 MG/ML IV BOLUS
INTRAVENOUS | Status: DC | PRN
  Administered 2015-06-17: 150 mg via INTRAVENOUS

## 2015-06-17 MED ORDER — PHENYLEPHRINE 40 MCG/ML (10ML) SYRINGE FOR IV PUSH (FOR BLOOD PRESSURE SUPPORT)
PREFILLED_SYRINGE | INTRAVENOUS | Status: AC
  Filled 2015-06-17: qty 10

## 2015-06-17 MED ORDER — SCOPOLAMINE 1 MG/3DAYS TD PT72
MEDICATED_PATCH | TRANSDERMAL | Status: AC
  Filled 2015-06-17: qty 1

## 2015-06-17 MED ORDER — MIDAZOLAM HCL 2 MG/2ML IJ SOLN
INTRAMUSCULAR | Status: AC
  Administered 2015-06-17: 2 mg via INTRAVENOUS
  Filled 2015-06-17: qty 2

## 2015-06-17 MED ORDER — BUPIVACAINE HCL (PF) 0.25 % IJ SOLN
INTRAMUSCULAR | Status: DC | PRN
  Administered 2015-06-17: 30 mL

## 2015-06-17 MED ORDER — SODIUM CHLORIDE 0.9 % IR SOLN
Status: DC | PRN
Start: 2015-06-17 — End: 2015-06-17
  Administered 2015-06-17 (×2): 3000 mL

## 2015-06-17 MED ORDER — LACTATED RINGERS IV SOLN
INTRAVENOUS | Status: DC
  Administered 2015-06-17 (×2): via INTRAVENOUS

## 2015-06-17 MED ORDER — EPHEDRINE SULFATE 50 MG/ML IJ SOLN
INTRAMUSCULAR | Status: AC
  Filled 2015-06-17: qty 1

## 2015-06-17 MED ORDER — GLYCOPYRROLATE 0.2 MG/ML IJ SOLN
INTRAMUSCULAR | Status: AC
  Filled 2015-06-17: qty 1

## 2015-06-17 MED ORDER — ROCURONIUM BROMIDE 100 MG/10ML IV SOLN
INTRAVENOUS | Status: DC | PRN
  Administered 2015-06-17: 40 mg via INTRAVENOUS

## 2015-06-17 MED ORDER — SUCCINYLCHOLINE CHLORIDE 20 MG/ML IJ SOLN
INTRAMUSCULAR | Status: AC
  Filled 2015-06-17: qty 1

## 2015-06-17 MED ORDER — EPINEPHRINE HCL 1 MG/ML IJ SOLN
INTRAMUSCULAR | Status: DC | PRN
  Administered 2015-06-17: .1 mL via INTRAMUSCULAR

## 2015-06-17 MED ORDER — DEXAMETHASONE SODIUM PHOSPHATE 10 MG/ML IJ SOLN
INTRAMUSCULAR | Status: AC
  Filled 2015-06-17: qty 1

## 2015-06-17 MED ORDER — MEPERIDINE HCL 25 MG/ML IJ SOLN: 6.2500 mg | INTRAMUSCULAR | Status: DC | PRN

## 2015-06-17 MED ORDER — PHENYLEPHRINE HCL 10 MG/ML IJ SOLN
10.0000 mg | INTRAVENOUS | Status: DC | PRN
  Administered 2015-06-17: 40 ug/min via INTRAVENOUS

## 2015-06-17 SURGICAL SUPPLY — 75 items
ANCHOR CORKSCREW BIO 5.5 FT (Anchor) ×3 IMPLANT
BENZOIN TINCTURE PRP APPL 2/3 (GAUZE/BANDAGES/DRESSINGS) ×3 IMPLANT
BLADE CUTTER GATOR 3.5 (BLADE) ×3 IMPLANT
BLADE GREAT WHITE 4.2 (BLADE) ×2 IMPLANT
BLADE GREAT WHITE 4.2MM (BLADE) ×1
BLADE SURG 11 STRL SS (BLADE) ×3 IMPLANT
BUR OVAL 6.0 (BURR) ×3 IMPLANT
CANNULA 5.75X71 LONG (CANNULA) ×3 IMPLANT
CLOSURE STERI-STRIP 1/2X4 (GAUZE/BANDAGES/DRESSINGS) ×1
CLOSURE WOUND 1/2 X4 (GAUZE/BANDAGES/DRESSINGS) ×1
CLSR STERI-STRIP ANTIMIC 1/2X4 (GAUZE/BANDAGES/DRESSINGS) ×2 IMPLANT
COVER SURGICAL LIGHT HANDLE (MISCELLANEOUS) ×3 IMPLANT
DRAPE INCISE IOBAN 66X45 STRL (DRAPES) ×6 IMPLANT
DRAPE STERI 35X30 U-POUCH (DRAPES) ×3 IMPLANT
DRAPE U-SHAPE 47X51 STRL (DRAPES) ×6 IMPLANT
DRSG MEPILEX BORDER 4X8 (GAUZE/BANDAGES/DRESSINGS) ×3 IMPLANT
DRSG PAD ABDOMINAL 8X10 ST (GAUZE/BANDAGES/DRESSINGS) ×9 IMPLANT
DRSG TEGADERM 2-3/8X2-3/4 SM (GAUZE/BANDAGES/DRESSINGS) ×3 IMPLANT
DRSG TEGADERM 4X4.75 (GAUZE/BANDAGES/DRESSINGS) ×3 IMPLANT
DURAPREP 26ML APPLICATOR (WOUND CARE) ×3 IMPLANT
ELECT REM PT RETURN 9FT ADLT (ELECTROSURGICAL) ×3
ELECTRODE REM PT RTRN 9FT ADLT (ELECTROSURGICAL) ×1 IMPLANT
FILTER STRAW FLUID ASPIR (MISCELLANEOUS) ×3 IMPLANT
GAUZE SPONGE 4X4 12PLY STRL (GAUZE/BANDAGES/DRESSINGS) ×3 IMPLANT
GAUZE XEROFORM 1X8 LF (GAUZE/BANDAGES/DRESSINGS) ×3 IMPLANT
GLOVE BIOGEL PI IND STRL 7.5 (GLOVE) ×1 IMPLANT
GLOVE BIOGEL PI IND STRL 8 (GLOVE) ×1 IMPLANT
GLOVE BIOGEL PI INDICATOR 7.5 (GLOVE) ×2
GLOVE BIOGEL PI INDICATOR 8 (GLOVE) ×2
GLOVE ECLIPSE 7.0 STRL STRAW (GLOVE) ×3 IMPLANT
GLOVE SURG ORTHO 8.0 STRL STRW (GLOVE) ×3 IMPLANT
GOWN STRL REUS W/ TWL LRG LVL3 (GOWN DISPOSABLE) IMPLANT
GOWN STRL REUS W/ TWL XL LVL3 (GOWN DISPOSABLE) ×2 IMPLANT
GOWN STRL REUS W/TWL LRG LVL3 (GOWN DISPOSABLE)
GOWN STRL REUS W/TWL XL LVL3 (GOWN DISPOSABLE) ×4
KIT BASIN OR (CUSTOM PROCEDURE TRAY) ×3 IMPLANT
KIT ROOM TURNOVER OR (KITS) ×3 IMPLANT
MANIFOLD NEPTUNE II (INSTRUMENTS) ×3 IMPLANT
NDL SUT 6 .5 CRC .975X.05 MAYO (NEEDLE) ×1 IMPLANT
NEEDLE HYPO 25X1 1.5 SAFETY (NEEDLE) ×3 IMPLANT
NEEDLE MAYO TAPER (NEEDLE) ×2
NEEDLE SCORPION MULTI FIRE (NEEDLE) ×3 IMPLANT
NEEDLE SPNL 18GX3.5 QUINCKE PK (NEEDLE) ×3 IMPLANT
NS IRRIG 1000ML POUR BTL (IV SOLUTION) ×3 IMPLANT
PACK SHOULDER (CUSTOM PROCEDURE TRAY) ×3 IMPLANT
PAD ARMBOARD 7.5X6 YLW CONV (MISCELLANEOUS) ×6 IMPLANT
PUSHLOCK PEEK 4.5X24 (Orthopedic Implant) ×6 IMPLANT
RESTRAINT HEAD UNIVERSAL NS (MISCELLANEOUS) ×3 IMPLANT
SET ARTHROSCOPY TUBING (MISCELLANEOUS) ×2
SET ARTHROSCOPY TUBING LN (MISCELLANEOUS) ×1 IMPLANT
SLING ARM IMMOBILIZER MED (SOFTGOODS) ×3 IMPLANT
SPONGE GAUZE 4X4 12PLY STER LF (GAUZE/BANDAGES/DRESSINGS) ×3 IMPLANT
SPONGE LAP 4X18 X RAY DECT (DISPOSABLE) ×6 IMPLANT
STRIP CLOSURE SKIN 1/2X4 (GAUZE/BANDAGES/DRESSINGS) ×2 IMPLANT
SUCTION FRAZIER HANDLE 10FR (MISCELLANEOUS) ×2
SUCTION TUBE FRAZIER 10FR DISP (MISCELLANEOUS) ×1 IMPLANT
SUT ETHILON 3 0 PS 1 (SUTURE) ×3 IMPLANT
SUT FIBERWIRE #2 38 T-5 BLUE (SUTURE)
SUT FIBERWIRE 2-0 18 17.9 3/8 (SUTURE) ×24
SUT PROLENE 3 0 PS 2 (SUTURE) IMPLANT
SUT VIC AB 0 CT1 27 (SUTURE) ×4
SUT VIC AB 0 CT1 27XBRD ANBCTR (SUTURE) ×2 IMPLANT
SUT VIC AB 1 CT1 27 (SUTURE) ×2
SUT VIC AB 1 CT1 27XBRD ANBCTR (SUTURE) ×1 IMPLANT
SUT VIC AB 2-0 CT1 27 (SUTURE) ×2
SUT VIC AB 2-0 CT1 TAPERPNT 27 (SUTURE) ×1 IMPLANT
SUT VICRYL 0 UR6 27IN ABS (SUTURE) ×3 IMPLANT
SUTURE FIBERWR #2 38 T-5 BLUE (SUTURE) IMPLANT
SUTURE FIBERWR 2-0 18 17.9 3/8 (SUTURE) ×8 IMPLANT
SYR 20CC LL (SYRINGE) ×6 IMPLANT
SYR TB 1ML LUER SLIP (SYRINGE) ×3 IMPLANT
TOWEL OR 17X24 6PK STRL BLUE (TOWEL DISPOSABLE) ×3 IMPLANT
TOWEL OR 17X26 10 PK STRL BLUE (TOWEL DISPOSABLE) ×3 IMPLANT
WAND HAND CNTRL MULTIVAC 90 (MISCELLANEOUS) IMPLANT
WATER STERILE IRR 1000ML POUR (IV SOLUTION) ×3 IMPLANT

## 2015-06-17 NOTE — Anesthesia Preprocedure Evaluation (Addendum)
Anesthesia Evaluation  Patient identified by MRN, date of birth, ID band Patient awake    Reviewed: Allergy & Precautions, NPO status , Patient's Chart, lab work & pertinent test results  History of Anesthesia Complications (+) PONV and history of anesthetic complications  Airway Mallampati: II  TM Distance: >3 FB Neck ROM: Full    Dental no notable dental hx.    Pulmonary pneumonia, former smoker,    Pulmonary exam normal breath sounds clear to auscultation       Cardiovascular negative cardio ROS Normal cardiovascular exam+ dysrhythmias  Rhythm:Regular Rate:Normal     Neuro/Psych  Headaches, negative neurological ROS  negative psych ROS   GI/Hepatic Neg liver ROS, GERD  ,  Endo/Other  negative endocrine ROSHypothyroidism   Renal/GU Renal disease     Musculoskeletal  (+) Arthritis ,   Abdominal   Peds  Hematology negative hematology ROS (+)   Anesthesia Other Findings   Reproductive/Obstetrics negative OB ROS                            Anesthesia Physical Anesthesia Plan  ASA: II  Anesthesia Plan: General and Regional   Post-op Pain Management: GA combined w/ Regional for post-op pain   Induction: Intravenous  Airway Management Planned: Oral ETT  Additional Equipment:   Intra-op Plan:   Post-operative Plan: Extubation in OR  Informed Consent: I have reviewed the patients History and Physical, chart, labs and discussed the procedure including the risks, benefits and alternatives for the proposed anesthesia with the patient or authorized representative who has indicated his/her understanding and acceptance.   Dental advisory given  Plan Discussed with: CRNA  Anesthesia Plan Comments:         Anesthesia Quick Evaluation

## 2015-06-17 NOTE — Brief Op Note (Signed)
06/17/2015  11:43 AM  PATIENT:  [name]  75 y.o. female  PRE-OPERATIVE DIAGNOSIS:  Right Shoulder Rotator Cuff Tear  POST-OPERATIVE DIAGNOSIS:  Right Shoulder Rotator Cuff Tear  PROCEDURE:  Procedure(s): SHOULDER DIAGNOSTIC OPERATIVE ARTHROSCOPY WITH MINI-OPEN ROTATOR CUFF REPAIR AND SUBACROMIAL DECOMPRESSION  SURGEON:  Surgeon(s): Cammy Copa, MD  ASSISTANT: Patrick Jupiter RNFA  ANESTHESIA:   general  EBL: 50 ml    Total I/O In: -  Out: 50 [Blood:50]  BLOOD ADMINISTERED: none  DRAINS: none   LOCAL MEDICATIONS USED:  none  SPECIMEN:  No Specimen  COUNTS:  YES  TOURNIQUET:  * No tourniquets in log *  DICTATION: .Other Dictation: Dictation Number dictated  PLAN OF CARE: Discharge to home after PACU  PATIENT DISPOSITION:  PACU - hemodynamically stable

## 2015-06-17 NOTE — Anesthesia Procedure Notes (Addendum)
Procedure Name: Intubation Date/Time: 06/17/2015 9:05 AM Performed by: Fabian November Pre-anesthesia Checklist: Patient identified, Patient being monitored, Timeout performed, Emergency Drugs available and Suction available Patient Re-evaluated:Patient Re-evaluated prior to inductionOxygen Delivery Method: Circle System Utilized Preoxygenation: Pre-oxygenation with 100% oxygen Intubation Type: IV induction Ventilation: Mask ventilation without difficulty Laryngoscope Size: Miller and 3 Grade View: Grade I Tube type: Oral Tube size: 7.5 mm Number of attempts: 1 Airway Equipment and Method: Stylet Placement Confirmation: ETT inserted through vocal cords under direct vision,  positive ETCO2 and breath sounds checked- equal and bilateral Secured at: 22 cm Tube secured with: Tape Dental Injury: Teeth and Oropharynx as per pre-operative assessment    Anesthesia Regional Block:  Interscalene brachial plexus block  Pre-Anesthetic Checklist: ,, timeout performed, Correct Patient, Correct Site, Correct Laterality, Correct Procedure, Correct Position, site marked, Risks and benefits discussed, Surgical consent,  Pre-op evaluation,  Post-op pain management  Laterality: Right  Prep: chloraprep       Needles:  Injection technique: Single-shot  Needle Type: Stimulator Needle - 40     Needle Length: 4cm 4 cm Needle Gauge: 22 and 22 G    Additional Needles:  Procedures: ultrasound guided (picture in chart) and nerve stimulator Interscalene brachial plexus block Narrative:  Injection made incrementally with aspirations every 5 mL.  Performed by: Personally  Anesthesiologist: Lewie Loron  Additional Notes: BP cuff, EKG monitors applied. Sedation begun. Nerve location verified with U/S. Anesthetic injected incrementally, slowly , and after neg aspirations under direct u/s guidance. Good perineural spread. Tolerated well.

## 2015-06-17 NOTE — Progress Notes (Signed)
Patient with some increasing anxiety, fretful saying she was sick and her head hurt, but denied pain.  Versed 1 mg given for anxiety, had prev received phenergan 6.25 and anesthesia treated for PONV peri-operatively

## 2015-06-17 NOTE — Op Note (Signed)
NAME:  [name], [name]          ACCOUNT NO.:  [id number]  MEDICAL RECORD NO.:  [id number]  LOCATION:  [facility]                         FACILITY:  [facility]  PHYSICIAN:  Burnard Bunting, M.D.    DATE OF BIRTH:  1941-02-17  DATE OF PROCEDURE: DATE OF DISCHARGE:                              OPERATIVE REPORT   PREOPERATIVE DIAGNOSIS:  Right shoulder rotator cuff tear and loose body.  POSTOPERATIVE DIAGNOSIS:  Right shoulder rotator cuff tear and loose body.  PROCEDURES:  Right shoulder arthroscopy, removal of loose body, mini open rotator cuff tear repair, subacromial decompression.  SURGEON:  Burnard Bunting, M.D.  ASSISTANT:  Patrick Jupiter, RNFA.  INDICATIONS:  [name] is a 75 year old patient with right shoulder pain, presents for operative management after explanation of risks and benefits.  OPERATIVE FINDINGS: 1. Examination under anesthesia.  The patient did have a little bit of     an early frozen shoulder with about 150 forward flexion on the     right compared to 180 on the left.  Isolated glenohumeral abduction     about 85 on the right compared to 110 on the left.  External     rotation of 15 degrees, abduction was about 50 on the right     compared to 60 on the left. 2. Diagnostic arthroscopy.     a.     Some inflammation within the rotator interval and in the      capsule.     b.     Intact biceps anchor and biceps tendon.     c.     Pretty significant tear of the supraspinatus tendon with      delamination component.     d.     Mild bursitis and type 1 to type 2 acromion.  PROCEDURE IN DETAIL:  The patient was brought to the operating room where general endotracheal anesthesia was induced.  Preoperative antibiotics were administered.  Time-out was called.  The patient was placed in the beach-chair position with the head in neutral position. Arm was manipulated into full forward flexion and abduction with some breaking up of scar tissue.  The arm was  prescrubbed with alcohol and Betadine, allowed to air dry, prepped with DuraPrep solution and draped in a sterile manner.  After manipulation, the patient was seated up in the beach-chair position with the head in neutral position.  [name] was used to cover the axilla.  Solution of saline and epinephrine injected into the subacromial space.  Solution of saline injected into the shoulder joint.  Posterior portal created 2 cm medial and inferior to the posterolateral margin of the acromion.  Diagnostic arthroscopy was performed.  Anterior portal created under direct visualization, loose body present in the axillary pouch.  This was removed with portal reversal, scoped in anterior and grasper in posterior.  The loose body measured about 6 x 6 mm.  Following this, the joint was inspected, glenohumeral articular surface intact, biceps anchor stable, rotator cuff tear was pretty significant retracted back to the humeral head. Biceps tendon was intact.  At this time, instruments were removed from their portals.  Portals were closed using 3-0 nylon.  [name] was then used to cover  the operative field.  Incision was made off the anterolateral margin of the acromion.  Skin and subcutaneous tissue were sharply divided.  Raphe between the anterior and middle deltoid was split, measured distance marked with a #1 Vicryl suture at 4 cm. Rotator cuff tear was identified.  There was a delamination component present.  Tear was mobilized superior and inferior with a Cobb.  The inferior aspect was sutured to the superior aspect, which was more mobile.  This was done with a 2-0 FiberWire suture x2 under direct visualization.  At this time, side-to-side repair between the rotator interval tissue and the rotator cuff tear was performed with arm in maximal external rotation.  This was done with 2-0 FiberWire.  In addition, the supraspinatus was repaired side to side to the infraspinatus using multiple 2-0 FiberWire  sutures.  At this time, this really mobilized the supraspinatus back to its attachment site on the footprint.  Footprint was roughened with the curette.  Single 5.5 corkscrew was placed, both sutures were then placed through the somewhat more medial aspect of the supraspinatus tendon that was repaired down with minimal tension and secured using two PushLocks.  This gave a very secure fixation, watertight.  Arm was taken through range of motion, found to be stable.  Thorough irrigation was performed at this time. Deltoid split closed using #1 Vicryl suture followed by interrupted inverted 0 Vicryl suture, 2-0 Vicryl suture and 3-0 Monocryl. Impervious dressing was placed.  The patient tolerated the procedure well without immediate complication, placed in a sling.     Burnard Bunting, M.D.     GSD/MEDQ  D:  06/17/2015  T:  06/17/2015  Job:  [id number]

## 2015-06-17 NOTE — Interval H&P Note (Signed)
History and Physical Interval Note:  06/17/2015 8:36 AM  [name]  has presented today for surgery, with the diagnosis of Right Shoulder Rotator Cuff Tear  The various methods of treatment have been discussed with the patient and family. After consideration of risks, benefits and other options for treatment, the patient has consented to  Procedure(s): SHOULDER DIAGNOSTIC OPERATIVE ARTHROSCOPY WITH MINI-OPEN ROTATOR CUFF REPAIR AND SUBACROMIAL DECOMPRESSION (Right) as a surgical intervention .  The patient's history has been reviewed, patient examined, no change in status, stable for surgery.  I have reviewed the patient's chart and labs.  Questions were answered to the patient's satisfaction.     [name]

## 2015-06-17 NOTE — Anesthesia Postprocedure Evaluation (Signed)
Anesthesia Post Note  Patient: Latara E Talcott  Procedure(s) Performed: Procedure(s) (LRB): SHOULDER DIAGNOSTIC OPERATIVE ARTHROSCOPY WITH MINI-OPEN ROTATOR CUFF REPAIR AND SUBACROMIAL DECOMPRESSION (Right)  Patient location during evaluation: PACU Anesthesia Type: General and Regional Level of consciousness: sedated and patient cooperative Pain management: pain level controlled Vital Signs Assessment: post-procedure vital signs reviewed and stable Respiratory status: spontaneous breathing Cardiovascular status: stable Anesthetic complications: no    Last Vitals:  Filed Vitals:   06/17/15 1245 06/17/15 1300  BP: 108/47 107/54  Pulse: 57   Temp:    Resp: 16     Last Pain:  Filed Vitals:   06/17/15 1307  PainSc: Asleep                 Lewie Loron

## 2015-06-17 NOTE — Transfer of Care (Signed)
Immediate Anesthesia Transfer of Care Note  Patient: [name]  Procedure(s) Performed: Procedure(s): SHOULDER DIAGNOSTIC OPERATIVE ARTHROSCOPY WITH MINI-OPEN ROTATOR CUFF REPAIR AND SUBACROMIAL DECOMPRESSION (Right)  Patient Location: PACU  Anesthesia Type:GA combined with regional for post-op pain  Level of Consciousness: awake, patient cooperative and lethargic  Airway & Oxygen Therapy: Patient Spontanous Breathing and Patient connected to nasal cannula oxygen  Post-op Assessment: Report given to RN and Post -op Vital signs reviewed and stable  Post vital signs: Reviewed and stable  Last Vitals:  Filed Vitals:   06/17/15 0658  BP: 125/52  Pulse: 64  Temp: 36.3 C  Resp: 20    Complications: No apparent anesthesia complications

## 2015-06-18 ENCOUNTER — Encounter (HOSPITAL_COMMUNITY): Payer: Self-pay | Admitting: Orthopedic Surgery

## 2015-10-27 ENCOUNTER — Telehealth: Payer: Self-pay

## 2015-10-27 NOTE — Telephone Encounter (Signed)
I called pt to r/s her appt on 10/28/15 due to Dr. Vickey Hugerohmeier being out of the office. I left a message on both pt's cell and home phone asking her to call me back.

## 2015-10-27 NOTE — Telephone Encounter (Signed)
Pt returned our call and our phone staff rescheduled her for July.

## 2015-10-28 ENCOUNTER — Ambulatory Visit: Payer: Self-pay | Admitting: Neurology

## 2015-12-01 ENCOUNTER — Ambulatory Visit (INDEPENDENT_AMBULATORY_CARE_PROVIDER_SITE_OTHER): Payer: Medicare Other | Admitting: Neurology

## 2015-12-01 ENCOUNTER — Encounter: Payer: Self-pay | Admitting: Neurology

## 2015-12-01 VITALS — BP 118/70 | HR 86 | Resp 20 | Ht 65.75 in | Wt 129.0 lb

## 2015-12-01 DIAGNOSIS — M5030 Other cervical disc degeneration, unspecified cervical region: Secondary | ICD-10-CM | POA: Diagnosis not present

## 2015-12-01 DIAGNOSIS — G3184 Mild cognitive impairment, so stated: Secondary | ICD-10-CM

## 2015-12-01 NOTE — Progress Notes (Signed)
[facility]  Provider:  Melvyn Novas, M D  Referring Provider: Merri Brunette, MD Primary Care Physician:  Allean Found, MD  Chief Complaint  Patient presents with  . Follow-up    memory is "good"    HPI:  [name] is a 75 y.o. caucasain, married and meanwhile retired female . She is seen here as a referral  from Dr. Katrinka Blazing for memory evaluation, characterized by verbal communication difficulties, subjective  word finding difficulties.   [name] has noted neologisms as well as paraphasic errors with increasing frequency over the last 6-7 years.  I obtained an MRI of the brain ( 09-19-13 )  with and without contrast to evaluate her for possible vascular injuries or focal brain atrophies which returned entirely normal. Actually her brain looked much younger than her numeric age would normally suggest. There were no normal lesions,  all ventricles were normal in size,  nor atrophy no mass effect and no significant white matter disease.  The normal MRI was followed by a PET scan,  Fuorbetapir uptake did not indicate Alzheimer's Tangles, these news were a great relief. ( Dr. Amil Amen , 11-05-13)  The patient was placed on a higher dose taper  of Prednisone for the treatment of joint pain by her [facility] Rheumatologist , and she noted a mental clarity that she craved during therapy.Trochanteric and knee arthralgias and bursitis cleared temporarily and she felt the reduced pain allowed her to function mentally better as well as physically. Her sleep was better , 7.5 hours on the prednisone.  She has exchanged words again after weaning off, told her daughter" to switch [name]'s doctor" instead of "diaper ". She noted that she writes fluently and when reading the product , sees neologims and paraphrasic errors even than - word substitution . Could this be a primary progressive aphasia ? Could this be pain related, insomnia? She sleeps from 11 PM  to 5.30 AM right  now with pain.     Interval history, 12-01-2015 [name]'s life has drastically changed with the unexpected passing of her Husband, [name], May 27 th 2016, of small cell lung cancer, identified only 3 month earlier. Her memory concerns are not dominating her life at this time.   Montreal Cognitive Assessment  12/01/2015 03/12/2014  Visuospatial/ Executive (0/5) 5 5  Naming (0/3) 3 3  Attention: Read list of digits (0/2) 2 2  Attention: Read list of letters (0/1) 1 1  Attention: Serial 7 subtraction starting at 100 (0/3) 3 2  Language: Repeat phrase (0/2) 2 2  Language : Fluency (0/1) 1 1  Abstraction (0/2) 2 2  Delayed Recall (0/5) 5 5  Orientation (0/6) 6 6  Total 30 29  Adjusted Score (based on education) 30 -     COMPLAINT: [name] describes how she has had difficulties recalling details from conversations, articles she may have read, perhaps even radio- broadcasts or information from books.  12-01-2015  CD She stated last year that to be effective in her conversations, she would need to recall and be able to quote the author and edition of a certain publication, as she used to.  She was uncomfortable going to [employer]  on a mission for [employer], as she could not get herself to retain the detail needed to discuss.  She is painfully aware of this deficit , as her command of language and details used to be excellent.  Her husband became more concerned about her tendency  to change words or the ending of words- especially when using longer words.  She gave two examples: In a cool room , she told her husband she was "happy to wear her purse". She has used the word "perspection " instead of "perspective". Using neologism and parapharasias.  Her daughter noted her to be quiet in conversations that she usually would contribute to. She is afraid to get stuck. She is unaware that she changed the words, until she notices the reaction of the listener.   Her  rheumatologist/ gynecologist  diagnosed her with sero-negative psoriatic arthritis and adrenal insufficiency. 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 This was in 2007-8.   This has become a concern - she states this is not delayed recall , it's :" A word behind a wall and no way to get to it ".  Her paternal aunts had dementia, her mother had no dementia in advanced age (42) .  Her sonhas meanwhile moved to [place] and she spent the summer with her now 32-89 year old granddaughter.   Review of Systems: Out of a complete 14 system review, the patient complains of only the following symptoms, and all other reviewed systems are negative.  Joint pain, post nasal drip, hoarseness, no laryngitis.   Social History   Social History  . Marital Status: Married    Spouse Name: [name]  . Number of Children: 2  . Years of Education: Masters   Occupational History  . 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 Smoking status: Former Smoker    Quit date: 05/24/1993  . Smokeless tobacco: Never Used  . Alcohol Use: 0.0 oz/week    0 Standard drinks or equivalent per week     Comment: 2 ounce red wine 3-4 wks  . Drug Use: No  . Sexual Activity: Not on file   Other Topics Concern  . Not on file   Social History Narrative   Patient is married [name]) and lives at home with her husband.   Patient has two children.   Patient is retired.   Patient has a Master's degree.   Patient drinks two cups of coffee daily.   Patient is right-handed.          Family History  Problem Relation Age of Onset  . Colon cancer Mother   . Esophageal cancer Father   . Stomach cancer Father   . Rectal cancer Neg Hx   . Hypertension Father   . Hypertension Mother   . Breast cancer Mother   . Heart attack Paternal Grandfather   . Diabetes Paternal Grandmother   . Liver cancer Maternal Grandmother      Past Medical History  Diagnosis Date  . Arthritis   . Blood transfusion without reported diagnosis   . Cataract   . GERD (gastroesophageal reflux disease)   . Hyperlipidemia   . Chronic kidney disease     hx bladder infections  . Neuromuscular disorder (HCC)     chronic muscle pain- takes Progesterone  . Neuromuscular disease (HCC)     fibromyalgia  . History of migraine headaches   . Hypothyroidism   . Gastric polyps   . Adrenal gland dysfunction (HCC)      dr Vincente Poli, [facility]   . Chronic kidney disease   . PONV (postoperative nausea and vomiting)   . Dysrhythmia  wore holter monitor  . Pneumonia   . chronic headaches     Past Surgical History  Procedure Laterality Date  . Tonsillectomy    . Rhinoplasty    . Abdominal hysterectomy    . Appendectomy      with hysterectomy  . Cholecystectomy    . Hand surgery      middle finger right hand  . Rectocele repair    . Incontinence surgery    . Colonoscopy    . Cataract surgery    . Cardiac catheterization    . Eye surgery      cataract BIL  . Shoulder arthroscopy with rotator cuff repair and subacromial decompression Right 06/17/2015    Procedure: SHOULDER DIAGNOSTIC OPERATIVE ARTHROSCOPY WITH MINI-OPEN ROTATOR CUFF REPAIR AND SUBACROMIAL DECOMPRESSION;  Surgeon: Cammy CopaScott Gregory Dean, MD;  Location: [facility] OR;  Service: Orthopedics;  Laterality: Right;    Current Outpatient Prescriptions  Medication Sig Dispense Refill  . butalbital-aspirin-caffeine (FIORINAL) 50-325-40 MG per capsule Take 1 capsule by mouth every 6 (six) hours as needed (for headaches.).     Marland Kitchen. Cholecalciferol (VITAMIN D3) 1000 UNITS CAPS Take 1 capsule by mouth daily.     Marland Kitchen. CLIMARA 0.1 MG/24HR Place 1 patch onto the skin once a week.     . clobetasol (TEMOVATE) 0.05 % external solution Apply 1 application topically 2 (two) times daily as needed (psorasis).     . CRESTOR 5 MG tablet Take 5 mg by mouth daily.     . fluocinonide (LIDEX) 0.05 %  external solution Apply 1 application topically 2 (two) times daily as needed (for psorasis.).     Marland Kitchen. hydroxychloroquine (PLAQUENIL) 200 MG tablet Take 400 mg by mouth at bedtime.   1  . Multiple Vitamins-Minerals (ICAPS AREDS FORMULA PO) Take by mouth.    . NON FORMULARY Testosterone versa based cream 0.4%  Applies 0.5 mL four times weekly    . Omega-3 Fatty Acids (FISH OIL) 1000 MG CAPS Take 1 capsule by mouth 2 (two) times daily.     . progesterone (PROMETRIUM) 100 MG capsule Take 100 mg by mouth at bedtime.     . SULFAZINE 500 MG tablet Take 1,000 mg by mouth 2 (two) times daily. Take 1000mg  in the morning and 1500mg  at bedtime    . SYNTHROID 50 MCG tablet Take 50 mcg by mouth every morning.      No current facility-administered medications for this visit.    Allergies as of 12/01/2015 - Review Complete 12/01/2015  Allergen Reaction Noted  . Imitrex [sumatriptan] Nausea And Vomiting and Hypertension 03/10/2012  . Nsaids Other (See Comments) 03/12/2014  . Keflex [cephalexin] Nausea And Vomiting 03/12/2014    Vitals: BP 118/70 mmHg  Pulse 86  Resp 20  Ht 5' 5.75" (1.67 m)  Wt 129 lb (58.514 kg)  BMI 20.98 kg/m2 Last Weight:  Wt Readings from Last 1 Encounters:  12/01/15 129 lb (58.514 kg)   Last Height:   Ht Readings from Last 1 Encounters:  12/01/15 5' 5.75" (1.67 m)    Physical exam:  General: The patient is awake, alert and appears not in acute distress. The patient is well groomed. Head: Normocephalic, atraumatic. Neck is supple. Mallampati 2 , neck circumference: 14, no TMJ, no nasal congestion.  Cardiovascular:  Regular rate and rhythm, without  murmurs or carotid bruit, and without distended neck veins. Respiratory: Lungs are clear to auscultation. Skin:  Without evidence of edema, or rash Trunk: this  patient has a normal  posture.  Neurologic exam : The patient is awake and alert, oriented to place and time.     Family and patient subjective concerned about  her memory :  MOCA was 29-30 , today 30-30 ,  only lost one word of recall. In  the last test from 08-2013.  Today 30-30 points. She subjectivly felt that serial 7 was difficult.  No alexia, but not sure about agraphia- she is needing to check her writing and prefers the compute over handwriting for this reason.   Notebook user, list maker to have memory crutches.  There is a normal attention span & concentration ability.  Eloquent Speech is fluent without dysarthria, dysphonia or aphasia. She subjectively feels still impaired in immediate recall and memory of spoken information.   Mood and affect are appropriate.  Cranial nerves: Pupils are equal and briskly reactive to light. Funduscopic exam without  evidence of pallor or edema. Extraocular movements  in vertical and horizontal planes intact and without nystagmus. Normal smooth pursuit , frontal release signs not present. No rooting , Visual fields by finger perimetry are intact. Hearing to finger rub intact.  Facial sensation intact to fine touch. Facial motor strength is symmetric and tongue and uvula move midline.  Sensory deficits with numbness of the outer toes outer 3 toes bilaterally both feet occurring simultaneously. This may be related to a limbal degeneration. I was able to review a report for a cervical spine MRI that Dr. Rise Paganini had ordered for the patient dated 05-27-15 it documented disc space loss which is not unusual for age but widespread degeneration with associated multifactorial moderate or severe neural foramina stenosis mostly on the right affecting C4, C5, C6 and C7.   Assessment:  After physical and neurologic examination, review of laboratory studies, imaging, neurophysiology testing and pre-existing records, assessment is  Not a semantic aphasia but word finding difficulties.  Cervical DDD, rheumatic psoriatric arthritis.    Plan:  Treatment plan and additional workup : I like for this pleasant patient to have a  detailed neuropsychological evaluation with a speech and language evaluation.  Has seen Dr. Leonides Cave, and I like a RV to retest with him or a Colleague.   MOCA was 30-30, but this patient is highly educated and very intelligent. Please evaluate for a possible conduction or transcortical aphasia, without alexia or agraphia.  She felt that the aphasia component is improved gradually.  Her autoimmune arthritis responded to plaquenil. I offer neuro-rehab for the cervical spine to see of she can be released to a gym, neurological evaluation for neck pain . She is referred to a new PT establishment. Suggested massage therapy for neck.  Dry needling.  Sleep is improved on Progesterone.   2-3 week  RV without  MOCA.     Raheen Capili, MD  CC Rise Paganini, MD

## 2015-12-01 NOTE — Addendum Note (Signed)
Addended by: Melvyn NovasHMEIER, Abhi Moccia on: 12/01/2015 12:20 PM   Modules accepted: Orders

## 2016-01-01 ENCOUNTER — Ambulatory Visit (INDEPENDENT_AMBULATORY_CARE_PROVIDER_SITE_OTHER): Payer: Medicare Other | Admitting: Diagnostic Neuroimaging

## 2016-01-01 ENCOUNTER — Encounter (INDEPENDENT_AMBULATORY_CARE_PROVIDER_SITE_OTHER): Payer: Self-pay | Admitting: Diagnostic Neuroimaging

## 2016-01-01 DIAGNOSIS — M50121 Cervical disc disorder at C4-C5 level with radiculopathy: Secondary | ICD-10-CM | POA: Diagnosis not present

## 2016-01-01 DIAGNOSIS — M5030 Other cervical disc degeneration, unspecified cervical region: Secondary | ICD-10-CM

## 2016-01-01 DIAGNOSIS — M50322 Other cervical disc degeneration at C5-C6 level: Secondary | ICD-10-CM

## 2016-01-01 DIAGNOSIS — M50123 Cervical disc disorder at C6-C7 level with radiculopathy: Secondary | ICD-10-CM | POA: Diagnosis not present

## 2016-01-01 DIAGNOSIS — Z0289 Encounter for other administrative examinations: Secondary | ICD-10-CM

## 2016-01-01 NOTE — Procedures (Signed)
   [facility]  NCS (NERVE CONDUCTION STUDY) WITH EMG (ELECTROMYOGRAPHY) REPORT   STUDY DATE: 01/01/16 PATIENT NAME: Cheryl LambertMary Ellen Sentell DOB: 04/27/1941 MRN: [id number]  ORDERING CLINICIAN: Melvyn Novasarmen Dohmeier, MD   TECHNOLOGIST: Gearldine ShownLorraine Jones  ELECTROMYOGRAPHER: Glenford BayleyVikram R. Karma Hiney, MD  CLINICAL INFORMATION: 75 year old female with right neck and shoulder pain. Evaluate for right cervical radiculopathy.  FINDINGS: NERVE CONDUCTION STUDY: Bilateral median and ulnar motor responses and F wave latencies are normal. Bilateral median and ulnar sensory responses are normal.   NEEDLE ELECTROMYOGRAPHY: Needle examination of right upper extremity deltoid, biceps, triceps, flexor carpi radialis, first dorsal interosseous is normal.  Right C4-5 paraspinal muscles demonstrates 2+ positive sharp waves and fibrillation potentials.  Right C6-7 paraspinal muscles demonstrates 3+ positive sharp waves and fibrillation potentials.   IMPRESSION:  Mildly abnormal study demonstrating: 1. Needle EMG demonstrates abnormal spontaneous activity in the right C4-5 and C6-7 paraspinal muscles, raising possibility of cervical nerve root irritation. Correlate clinically and with neuroimaging studies. 2. No definite evidence of large fiber peripheral neuropathy or myopathy at this time.    INTERPRETING PHYSICIAN:  Suanne MarkerVIKRAM R. Xyla Leisner, MD Certified in Neurology, Neurophysiology and Neuroimaging  [facility] [address], [address] [place], [place] [address] [phone]

## 2016-01-05 ENCOUNTER — Ambulatory Visit (INDEPENDENT_AMBULATORY_CARE_PROVIDER_SITE_OTHER): Payer: Medicare Other | Admitting: Neurology

## 2016-01-05 ENCOUNTER — Encounter: Payer: Self-pay | Admitting: Neurology

## 2016-01-05 VITALS — BP 110/70 | HR 76 | Resp 20 | Ht 65.0 in | Wt 135.0 lb

## 2016-01-05 DIAGNOSIS — M5010 Cervical disc disorder with radiculopathy, unspecified cervical region: Secondary | ICD-10-CM | POA: Diagnosis not present

## 2016-01-05 NOTE — Addendum Note (Signed)
Addended by: Geronimo RunningINKINS, Sheily Lineman A on: 01/05/2016 04:53 PM   Modules accepted: Orders

## 2016-01-05 NOTE — Progress Notes (Signed)
[facility]  Provider:  Melvyn Novasarmen  Aryana Wonnacott, M D  Referring Provider: Merri BrunetteSmith, Candace, MD Primary Care Physician:  Allean FoundSMITH,Cheryl THIELE, MD  Chief Complaint  Patient presents with  . Follow-up    discuss EMG reuslts    HPI:  [name] is a 75 y.o. caucasain, married and meanwhile retired female . She is seen here as a referral  from Dr. Katrinka BlazingSmith for memory evaluation, characterized by verbal communication difficulties, subjective  word finding difficulties.   [name] has noted neologisms as well as paraphasic errors with increasing frequency over the last 6-7 years.  I obtained an MRI of the brain ( 09-19-13 )  with and without contrast to evaluate her for possible vascular injuries or focal brain atrophies which returned entirely normal. Actually her brain looked much younger than her numeric age would normally suggest. There were no normal lesions,  all ventricles were normal in size,  nor atrophy no mass effect and no significant white matter disease.  The normal MRI was followed by a PET scan,  Fuorbetapir uptake did not indicate Alzheimer's Tangles, these news were a great relief. ( Dr. Amil AmenEdmunds , 11-05-13)  The patient was placed on a higher dose taper  of Prednisone for the treatment of joint pain by her [facility] Rheumatologist , and she noted a mental clarity that she craved during therapy.Trochanteric and knee arthralgias and bursitis cleared temporarily and she felt the reduced pain allowed her to function mentally better as well as physically. Her sleep was better , 7.5 hours on the prednisone.  She has exchanged words again after weaning off, told her daughter" to switch [name]'s doctor" instead of "diaper ". She noted that she writes fluently and when reading the product , sees neologims and paraphrasic errors even than - word substitution . Could this be a primary progressive aphasia ? Could this be pain related, insomnia? She sleeps from 11 PM  to 5.30 AM  right now with pain.    Interval history, 12-01-2015 [name] life has drastically changed with the unexpected passing of her Husband, Nedra HaiLee, May 27 th 2016, of small cell lung cancer, identified only 3 month earlier. Her memory concerns are not dominating her life at this time.   Montreal Cognitive Assessment  12/01/2015 03/12/2014  Visuospatial/ Executive (0/5) 5 5  Naming (0/3) 3 3  Attention: Read list of digits (0/2) 2 2  Attention: Read list of letters (0/1) 1 1  Attention: Serial 7 subtraction starting at 100 (0/3) 3 2  Language: Repeat phrase (0/2) 2 2  Language : Fluency (0/1) 1 1  Abstraction (0/2) 2 2  Delayed Recall (0/5) 5 5  Orientation (0/6) 6 6  Total 30 29  Adjusted Score (based on education) 30 -     COMPLAINT: [name] how she has had difficulties recalling details from conversations, articles she may have read, perhaps even radio- broadcasts or information from books.  12-01-2015  CD She stated last year that to be effective in her conversations, she would need to recall and be able to quote the author and edition of a certain publication, as she used to.  She was uncomfortable going to [employer] [place] [employer]  on a mission for [employer], as she could not get herself to retain the detail needed to discuss.  She is painfully aware of this deficit , as her command of language and details used to be excellent.  Her husband became more concerned about her tendency to  change words or the ending of words- especially when using longer words.  She gave two examples: In a cool room , she told her husband she was "happy to wear her purse". She has used the word "perspection " instead of "perspective". Using neologism and parapharasias.  Her daughter noted her to be quiet in conversations that she usually would contribute to. She is afraid to get stuck. She is unaware that she changed the words, until she notices the reaction of the listener.   Her  rheumatologist/ gynecologist  diagnosed her with sero-negative psoriatic arthritis and adrenal insufficiency. 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 This was in 2007-8.   This has become a concern - she states this is not delayed recall , it's :" A word behind a wall and no way to get to it ".  Her paternal aunts had dementia, her mother had no dementia in advanced age (50) .  Her sonhas meanwhile moved to [place] and she spent the summer with her now 68-19 year old granddaughter.   01-05-2016 I am meeting with [name] to discuss the results of her recent nerve conduction studies and electromyography. Dr. Joycelyn Schmid  discovered abnormal spontaneous activity was 3+ positive sharp waves and fibrillations between the right C6 and C7 vertebrae as well as those muscles that are innervated between the right C4 and C5 exiting nerves. This involves the deltoid, the triceps, biceps and the flexor carpi radialis. The PT also explained to her that her trapezius is involved . We discussed [name] a referral to a physical medicine and rehab MD> I recommended Dr. Doroteo Bradford.   Review of Systems: Out of a complete 14 system review, the patient complains of only the following symptoms, and all other reviewed systems are negative.  Joint pain, post nasal drip, hoarseness, no laryngitis.   Social History   Social History  . Marital status: Married    Spouse name: [name]  . Number of children: 2  . Years of education: Masters   Occupational History  . 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 Smoking status: Former Smoker    Quit date: 05/24/1993  . Smokeless tobacco: Never Used  . Alcohol use 0.0 oz/week     Comment: 2 ounce red wine 3-4 wks  . Drug use: No  . Sexual activity: Not on file   Other Topics Concern  . Not on file   Social History Narrative   Patient is  married [name]) and lives at home with her husband.   Patient has two children.   Patient is retired.   Patient has a Master's degree.   Patient drinks two cups of coffee daily.   Patient is right-handed.          Family History  Problem Relation Age of Onset  . Colon cancer Mother   . Esophageal cancer Father   . Stomach cancer Father   . Rectal cancer Neg Hx   . Hypertension Father   . Hypertension Mother   . Breast cancer Mother   . Heart attack Paternal Grandfather   . Diabetes Paternal Grandmother   . Liver cancer Maternal Grandmother     Past Medical History:  Diagnosis Date  . Adrenal gland dysfunction (HCC)     dr Vincente Poli, coricare B   . Arthritis   . Blood transfusion without reported diagnosis   .  Cataract   . chronic headaches   . Chronic kidney disease    hx bladder infections  . Chronic kidney disease   . Dysrhythmia    wore holter monitor  . Gastric polyps   . GERD (gastroesophageal reflux disease)   . History of migraine headaches   . Hyperlipidemia   . Hypothyroidism   . Neuromuscular disease (HCC)    fibromyalgia  . Neuromuscular disorder (HCC)    chronic muscle pain- takes Progesterone  . Pneumonia   . PONV (postoperative nausea and vomiting)     Past Surgical History:  Procedure Laterality Date  . ABDOMINAL HYSTERECTOMY    . APPENDECTOMY     with hysterectomy  . CARDIAC CATHETERIZATION    . cataract surgery    . CHOLECYSTECTOMY    . COLONOSCOPY    . EYE SURGERY     cataract BIL  . HAND SURGERY     middle finger right hand  . INCONTINENCE SURGERY    . RECTOCELE REPAIR    . RHINOPLASTY    . SHOULDER ARTHROSCOPY WITH ROTATOR CUFF REPAIR AND SUBACROMIAL DECOMPRESSION Right 06/17/2015   Procedure: SHOULDER DIAGNOSTIC OPERATIVE ARTHROSCOPY WITH MINI-OPEN ROTATOR CUFF REPAIR AND SUBACROMIAL DECOMPRESSION;  Surgeon: Cammy Copa, MD;  Location: [facility] OR;  Service: Orthopedics;  Laterality: Right;  . TONSILLECTOMY      Current Outpatient  Prescriptions  Medication Sig Dispense Refill  . butalbital-aspirin-caffeine (FIORINAL) 50-325-40 MG per capsule Take 1 capsule by mouth every 6 (six) hours as needed (for headaches.).     Marland Kitchen Cholecalciferol (VITAMIN D3) 1000 UNITS CAPS Take 1 capsule by mouth daily.     Marland Kitchen CLIMARA 0.1 MG/24HR Place 1 patch onto the skin once a week.     . clobetasol (TEMOVATE) 0.05 % external solution Apply 1 application topically 2 (two) times daily as needed (psorasis).     . CRESTOR 5 MG tablet Take 5 mg by mouth daily.     . fluocinonide (LIDEX) 0.05 % external solution Apply 1 application topically 2 (two) times daily as needed (for psorasis.).     Marland Kitchen hydroxychloroquine (PLAQUENIL) 200 MG tablet Take 400 mg by mouth at bedtime.   1  . Multiple Vitamins-Minerals (ICAPS AREDS FORMULA PO) Take by mouth.    . NON FORMULARY Testosterone versa based cream 0.4%  Applies 0.5 mL four times weekly    . Omega-3 Fatty Acids (FISH OIL) 1000 MG CAPS Take 1 capsule by mouth 2 (two) times daily.     . progesterone (PROMETRIUM) 100 MG capsule Take 100 mg by mouth at bedtime.     . SULFAZINE 500 MG tablet Take 1,000 mg by mouth 2 (two) times daily. Take 1000mg  in the morning and 1500mg  at bedtime    . SYNTHROID 50 MCG tablet Take 50 mcg by mouth every morning.      No current facility-administered medications for this visit.     Allergies as of 01/05/2016 - Review Complete 01/05/2016  Allergen Reaction Noted  . Imitrex [sumatriptan] Nausea And Vomiting and Hypertension 03/10/2012  . Nsaids Other (See Comments) 03/12/2014  . Keflex [cephalexin] Nausea And Vomiting 03/12/2014    Vitals: BP 110/70   Pulse 76   Resp 20   Ht 5\' 5"  (1.651 m)   Wt 135 lb (61.2 kg)   BMI 22.47 kg/m  Last Weight:  Wt Readings from Last 1 Encounters:  01/05/16 135 lb (61.2 kg)   Last Height:   Ht Readings from Last 1 Encounters:  01/05/16 5\' 5"  (1.651 m)    Physical exam:  General: The patient is awake, alert and appears not in  acute distress. The patient is well groomed. Head: Normocephalic, atraumatic. Neck is supple. Mallampati 2 , neck circumference: 14, no TMJ, no nasal congestion.  Cardiovascular:  Regular rate and rhythm, without  murmurs or carotid bruit, and without distended neck veins. Respiratory: Lungs are clear to auscultation. Skin:  Without evidence of edema, or rash Trunk: this  patient has a normal posture.  Neurologic exam : The patient is awake and alert, oriented to place and time.     Family and patient subjective concerned about her memory :  MOCA was 29-30 , [name] 30-30 ,  only lost one word of recall. In  the last test from 08-2013.  [name] 30-30 points. She subjectivly felt that serial 7 was difficult.  No alexia, but not sure about agraphia- she is needing to check her writing and prefers the compute over handwriting for this reason.   Notebook user, list maker to have memory crutches.  There is a normal attention span & concentration ability.  Eloquent Speech is fluent without dysarthria, dysphonia or aphasia. She subjectively feels still impaired in immediate recall and memory of spoken information.   Mood and affect are appropriate.  Cranial nerves: Pupils are equal and briskly reactive to light. Funduscopic exam without  evidence of pallor or edema. Extraocular movements  in vertical and horizontal planes intact and without nystagmus. Normal smooth pursuit , frontal release signs not present. No rooting , Visual fields by finger perimetry are intact. Hearing to finger rub intact.  Facial sensation intact to fine touch. Facial motor strength is symmetric and tongue and uvula move midline.  Sensory deficits with numbness of the outer toes outer 3 toes bilaterally both feet occurring simultaneously. This may be related to a limbal degeneration. I was able to review a report for a cervical spine MRI that Dr. Rise PaganiniGregory Dean had ordered for the patient dated 05-27-15 it documented disc space loss  which is not unusual for age but widespread degeneration with associated multifactorial moderate or severe neural foramina stenosis mostly on the right side - affecting between C4 and C5, C6 and C7.   Assessment:  After physical and neurologic examination, review of laboratory studies, imaging, neurophysiology testing and pre-existing records, assessment is  Not a semantic aphasia but word finding difficulties.  Cervical DDD, rheumatic psoriatric arthritis.    Plan:  Treatment plan and additional workup : I like for this pleasant patient to have a detailed neuropsychological evaluation with a speech and language evaluation.  Has seen Dr. Leonides CaveZelson, and I like a RV to retest with him or a Colleague.   MOCA was 30-30, but this patient is highly educated and very intelligent. Please evaluate for a possible conduction or transcortical aphasia, without alexia or agraphia.  She felt that the aphasia component is improved gradually.  Her autoimmune arthritis responded to plaquenil. I offer neuro-rehab for the cervical spine to see of she can be released to a gym, neurological evaluation for neck pain . She is referred to a new PT establishment. Suggested massage therapy for neck.  Dry needling.  Sleep is improved on Progesterone.  I referred to Dr. Doroteo BradfordKirstein, Physical medicine    Jeanenne Licea, MD  CC Rise PaganiniGregory Dean, MD

## 2016-01-05 NOTE — Patient Instructions (Signed)
Cervical Radiculopathy Cervical radiculopathy means that a nerve in the neck is pinched or bruised. This can cause pain or loss of feeling (numbness) that runs from your neck to your arm and fingers. HOME CARE Managing Pain  Take over-the-counter and prescription medicines only as told by your doctor.  If directed, put ice on the injured or painful area.  Put ice in a plastic bag.  Place a towel between your skin and the bag.  Leave the ice on for 20 minutes, 2-3 times per day.  If ice does not help, you can try using heat. Take a warm shower or warm bath, or use a heat pack as told by your doctor.  You may try a gentle neck and shoulder massage. Activity  Rest as needed. Follow instructions from your doctor about any activities to avoid.  Do exercises as told by your doctor or physical therapist. General Instructions   If you were given a soft collar, wear it as told by your doctor.  Use a flat pillow when you sleep.  Keep all follow-up visits as told by your doctor. This is important. GET HELP IF:  Your condition does not improve with treatment. GET HELP RIGHT AWAY IF:   Your pain gets worse and is not controlled with medicine.  You lose feeling or feel weak in your hand, arm, face, or leg.  You have a fever.  You have a stiff neck.  You cannot control when you poop or pee (have incontinence).  You have trouble with walking, balance, or talking.   This information is not intended to replace advice given to you by your health care provider. Make sure you discuss any questions you have with your health care provider.   Document Released: 04/29/2011 Document Revised: 01/29/2015 Document Reviewed: 07/04/2014 Elsevier Interactive Patient Education 2016 Elsevier Inc.  

## 2016-03-16 ENCOUNTER — Ambulatory Visit (HOSPITAL_BASED_OUTPATIENT_CLINIC_OR_DEPARTMENT_OTHER): Payer: Medicare Other | Admitting: Physical Medicine & Rehabilitation

## 2016-03-16 ENCOUNTER — Encounter: Payer: Medicare Other | Attending: Physical Medicine & Rehabilitation

## 2016-03-16 ENCOUNTER — Encounter: Payer: Self-pay | Admitting: Physical Medicine & Rehabilitation

## 2016-03-16 VITALS — BP 132/71 | HR 77 | Resp 14

## 2016-03-16 DIAGNOSIS — M5010 Cervical disc disorder with radiculopathy, unspecified cervical region: Secondary | ICD-10-CM

## 2016-03-16 DIAGNOSIS — M47812 Spondylosis without myelopathy or radiculopathy, cervical region: Secondary | ICD-10-CM | POA: Insufficient documentation

## 2016-03-16 DIAGNOSIS — M24511 Contracture, right shoulder: Secondary | ICD-10-CM | POA: Insufficient documentation

## 2016-03-16 MED ORDER — GABAPENTIN 100 MG PO CAPS: 100.0000 mg | ORAL_CAPSULE | Freq: Two times a day (BID) | ORAL | 1 refills | Status: DC

## 2016-03-16 MED ORDER — DICLOFENAC SODIUM 1 % TD GEL: 2.0000 g | Freq: Four times a day (QID) | TRANSDERMAL | 1 refills | Status: DC
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
## 2016-03-16 NOTE — Patient Instructions (Signed)
If the gabapentin is not helpful for the sharp shooting pains may need to increase dose as long as you are able to tolerate this. Please try the diclofenac  gel for the constant neck pain, if this is not helpful, may need to pursue cervical medial branch blocks

## 2016-03-30 ENCOUNTER — Ambulatory Visit (HOSPITAL_BASED_OUTPATIENT_CLINIC_OR_DEPARTMENT_OTHER): Payer: Medicare Other | Admitting: Physical Medicine & Rehabilitation

## 2016-03-30 ENCOUNTER — Encounter: Payer: Self-pay | Admitting: Physical Medicine & Rehabilitation

## 2016-03-30 ENCOUNTER — Encounter: Payer: Medicare Other | Attending: Physical Medicine & Rehabilitation

## 2016-03-30 VITALS — BP 133/75 | HR 64

## 2016-03-30 DIAGNOSIS — M47812 Spondylosis without myelopathy or radiculopathy, cervical region: Secondary | ICD-10-CM

## 2016-03-30 DIAGNOSIS — M24511 Contracture, right shoulder: Secondary | ICD-10-CM | POA: Insufficient documentation

## 2016-03-30 DIAGNOSIS — M5010 Cervical disc disorder with radiculopathy, unspecified cervical region: Secondary | ICD-10-CM

## 2016-03-30 MED ORDER — GABAPENTIN 100 MG PO CAPS
100.0000 mg | ORAL_CAPSULE | Freq: Three times a day (TID) | ORAL | 1 refills | Status: DC
Start: 2016-03-30 — End: 2016-04-27
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
## 2016-04-27 ENCOUNTER — Encounter: Payer: Self-pay | Admitting: Physical Medicine & Rehabilitation

## 2016-04-27 ENCOUNTER — Ambulatory Visit (HOSPITAL_BASED_OUTPATIENT_CLINIC_OR_DEPARTMENT_OTHER): Payer: Medicare Other | Admitting: Physical Medicine & Rehabilitation

## 2016-04-27 ENCOUNTER — Encounter: Payer: Medicare Other | Attending: Physical Medicine & Rehabilitation

## 2016-04-27 VITALS — BP 122/76 | HR 68 | Resp 14

## 2016-04-27 DIAGNOSIS — M47812 Spondylosis without myelopathy or radiculopathy, cervical region: Secondary | ICD-10-CM | POA: Insufficient documentation

## 2016-04-27 DIAGNOSIS — S46011A Strain of muscle(s) and tendon(s) of the rotator cuff of right shoulder, initial encounter: Secondary | ICD-10-CM | POA: Diagnosis not present

## 2016-04-27 DIAGNOSIS — M5010 Cervical disc disorder with radiculopathy, unspecified cervical region: Secondary | ICD-10-CM | POA: Diagnosis present

## 2016-04-27 DIAGNOSIS — M24511 Contracture, right shoulder: Secondary | ICD-10-CM | POA: Diagnosis present

## 2016-04-27 MED ORDER — GABAPENTIN 100 MG PO CAPS
100.0000 mg | ORAL_CAPSULE | Freq: Every day | ORAL | 1 refills | Status: DC
Start: 2016-04-27 — End: 2016-07-07

## 2016-04-27 NOTE — Patient Instructions (Signed)
Reduce gabapentin 100 mg at bedtime only  Will hold off in any imaging of the right shoulder unless pain worsens or fails to improve.  I anticipate right shoulder pain should improve over the next 2-3 weeks. If it is feeling a little bit better next week, I would resume your exercise program.

## 2016-04-27 NOTE — Progress Notes (Signed)
Subjective:    Patient ID: [name], female    DOB: 05/23/1941, 75 y.o.   MRN: [id number]  HPI Larey SeatFell on to right elbow 3-4 days ago while decorating, able to use right arm. No increased numbness or tingling in the shoulder. No increase in neck pain  Prior to that had been doing very well have been back to her exercise class, which she had not attended for one year.   Also has been more forgetful recently has been on gabapentin for the last 4 weeks, sleep is better ,    Pain Inventory Average Pain 4 Pain Right Now 4 My pain is intermittent  In the last 24 hours, has pain interfered with the following? General activity 2 Relation with others 0 Enjoyment of life 2 What TIME of day is your pain at its worst? daytime Sleep (in general) Good  Pain is worse with: some activites Pain improves with: medication Relief from Meds: 2  Mobility walk without assistance how many minutes can you walk? long time ability to climb steps?  yes do you drive?  yes Do you have any goals in this area?  no  Function retired Do you have any goals in this area?  no  Neuro/Psych tremor  Prior Studies Any changes since last visit?  no  Physicians involved in your care Any changes since last visit?  no   Family History  Problem Relation Age of Onset  . Esophageal cancer Father   . Stomach cancer Father   . Hypertension Father   . Colon cancer Mother   . Hypertension Mother   . Breast cancer Mother   . Heart attack Paternal Grandfather   . Diabetes Paternal Grandmother   . Liver cancer Maternal Grandmother   . Rectal cancer Neg Hx    Social History   Social History  . Marital status: Married    Spouse name: Nedra HaiLee  . Number of children: 2  . Years of education: Masters   Occupational History  . 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 Smoking status: Former Smoker    Quit date: 05/24/1993  . Smokeless tobacco: Never Used  . Alcohol  use 0.0 oz/week     Comment: 2 ounce red wine 3-4 wks  . Drug use: No  . Sexual activity: Not Asked   Other Topics Concern  . None   Social History Narrative   Patient is married [name] and lives at home with her husband.   Patient has two children.   Patient is retired.   Patient has a Master's degree.   Patient drinks two cups of coffee daily.   Patient is right-handed.         Past Surgical History:  Procedure Laterality Date  . ABDOMINAL HYSTERECTOMY    . APPENDECTOMY     with hysterectomy  . CARDIAC CATHETERIZATION    . cataract surgery    . CHOLECYSTECTOMY    . COLONOSCOPY    . EYE SURGERY     cataract BIL  . HAND SURGERY     middle finger right hand  . INCONTINENCE SURGERY    . RECTOCELE REPAIR    . RHINOPLASTY    . SHOULDER ARTHROSCOPY WITH ROTATOR CUFF REPAIR AND SUBACROMIAL DECOMPRESSION Right 06/17/2015   Procedure: SHOULDER DIAGNOSTIC OPERATIVE ARTHROSCOPY WITH MINI-OPEN ROTATOR CUFF REPAIR AND SUBACROMIAL DECOMPRESSION;  Surgeon: Cammy CopaScott Gregory Dean, MD;  Location: [facility] OR;  Service: Orthopedics;  Laterality: Right;  .  TONSILLECTOMY     Past Medical History:  Diagnosis Date  . Adrenal gland dysfunction (HCC)     dr Vincente PoliGrewal, [facility]   . Arthritis   . Blood transfusion without reported diagnosis   . Cataract   . chronic headaches   . Chronic kidney disease    hx bladder infections  . Chronic kidney disease   . Dysrhythmia    wore holter monitor  . Gastric polyps   . GERD (gastroesophageal reflux disease)   . History of migraine headaches   . Hyperlipidemia   . Hypothyroidism   . Neuromuscular disease (HCC)    fibromyalgia  . Neuromuscular disorder (HCC)    chronic muscle pain- takes Progesterone  . Pneumonia   . PONV (postoperative nausea and vomiting)    BP 122/76 (BP Location: Left Arm, Patient Position: Sitting, Cuff Size: Normal)   Pulse 68   Resp 14   SpO2 95%   Opioid Risk Score:   Fall Risk Score:  `1  Depression screen PHQ  2/9  Depression screen PHQ 2/9 03/16/2016  Decreased Interest 2  Down, Depressed, Hopeless 2  PHQ - 2 Score 4  Altered sleeping 1  Tired, decreased energy 1  Feeling bad or failure about yourself  0  Trouble concentrating 1  Moving slowly or fidgety/restless 0  Suicidal thoughts 0  PHQ-9 Score 7  Difficult doing work/chores Somewhat difficult    Review of Systems  Constitutional: Negative.   HENT: Negative.   Eyes: Negative.   Respiratory: Negative.   Cardiovascular: Negative.   Gastrointestinal: Negative.   Genitourinary: Negative.   Musculoskeletal: Positive for arthralgias.  Skin: Negative.   Allergic/Immunologic: Negative.   Neurological: Negative.   Hematological: Negative.   Psychiatric/Behavioral: Positive for decreased concentration.  All other systems reviewed and are negative.      Objective:   Physical Exam  Constitutional: She is oriented to person, place, and time. She appears well-developed and well-nourished.  HENT:  Head: Normocephalic and atraumatic.  Eyes: Conjunctivae and EOM are normal. Pupils are equal, round, and reactive to light.  Neck:  Decreased right lateral bending, unchanged compared to previous  Musculoskeletal:       Cervical back: She exhibits decreased range of motion and tenderness.  Tenderness around the right upper trapezius area   Neurological: She is alert and oriented to person, place, and time. Gait normal.  Motor strength is 5/5 bilateral deltoids by stress of grip  Skin: Skin is warm and dry.  Psychiatric: She has a normal mood and affect.  Nursing note and vitals reviewed.  right shoulder, negative speeds, sign No pain over the bicipital groove No pain with palpation in the subacromial area. No pain with shoulder adduction. Mild pain with Leanord AsalHawkins Kennedy maneuver on the right side only Negative drop arm test       Assessment & Plan:  1. Fall with some exacerbation of shoulder pain. She has some mildly positive  impingement signs. Do not think any imaging studies are needed at this point, I would expect the rotator cuff strain to improve over the next 2-3 weeks. Have asked her to avoid her exercise class for one more week. If she has persistent pain, she can follow-up with her orthopedic surgeon, Dr. Dorene GrebeScott Dean  2. History of cervical radiculitis, improved. Does not appear to have any exacerbation after the fall. Gabapentin may be causing some mental clouding, would recommend reducing dose to 100 mg at bedtime only Follow-up in 3 months.

## 2016-07-07 ENCOUNTER — Encounter: Payer: Self-pay | Admitting: Neurology

## 2016-07-07 ENCOUNTER — Ambulatory Visit (INDEPENDENT_AMBULATORY_CARE_PROVIDER_SITE_OTHER): Payer: Medicare Other | Admitting: Neurology

## 2016-07-07 VITALS — BP 110/58 | HR 70 | Resp 16 | Ht 65.5 in | Wt 136.0 lb

## 2016-07-07 DIAGNOSIS — M9901 Segmental and somatic dysfunction of cervical region: Secondary | ICD-10-CM

## 2016-07-07 NOTE — Patient Instructions (Signed)
Gabapentin capsules or tablets What is this medicine? GABAPENTIN (GA ba pen tin) is used to control partial seizures in adults with epilepsy. It is also used to treat certain types of nerve pain. This medicine may be used for other purposes; ask your health care provider or pharmacist if you have questions. COMMON BRAND NAME(S): Active-PAC with Gabapentin, Gabarone, Neurontin What should I tell my health care provider before I take this medicine? They need to know if you have any of these conditions: -kidney disease -suicidal thoughts, plans, or attempt; a previous suicide attempt by you or a family member -an unusual or allergic reaction to gabapentin, other medicines, foods, dyes, or preservatives -pregnant or trying to get pregnant -breast-feeding How should I use this medicine? Take this medicine by mouth with a glass of water. Follow the directions on the prescription label. You can take it with or without food. If it upsets your stomach, take it with food.Take your medicine at regular intervals. Do not take it more often than directed. Do not stop taking except on your doctor's advice. If you are directed to break the 600 or 800 mg tablets in half as part of your dose, the extra half tablet should be used for the next dose. If you have not used the extra half tablet within 28 days, it should be thrown away. A special MedGuide will be given to you by the pharmacist with each prescription and refill. Be sure to read this information carefully each time. Talk to your pediatrician regarding the use of this medicine in children. Special care may be needed. Overdosage: If you think you have taken too much of this medicine contact a poison control center or emergency room at once. NOTE: This medicine is only for you. Do not share this medicine with others. What if I miss a dose? If you miss a dose, take it as soon as you can. If it is almost time for your next dose, take only that dose. Do not  take double or extra doses. What may interact with this medicine? Do not take this medicine with any of the following medications: -other gabapentin products This medicine may also interact with the following medications: -alcohol -antacids -antihistamines for allergy, cough and cold -certain medicines for anxiety or sleep -certain medicines for depression or psychotic disturbances -homatropine; hydrocodone -naproxen -narcotic medicines (opiates) for pain -phenothiazines like chlorpromazine, mesoridazine, prochlorperazine, thioridazine This list may not describe all possible interactions. Give your health care provider a list of all the medicines, herbs, non-prescription drugs, or dietary supplements you use. Also tell them if you smoke, drink alcohol, or use illegal drugs. Some items may interact with your medicine. What should I watch for while using this medicine? Visit your doctor or health care professional for regular checks on your progress. You may want to keep a record at home of how you feel your condition is responding to treatment. You may want to share this information with your doctor or health care professional at each visit. You should contact your doctor or health care professional if your seizures get worse or if you have any new types of seizures. Do not stop taking this medicine or any of your seizure medicines unless instructed by your doctor or health care professional. Stopping your medicine suddenly can increase your seizures or their severity. Wear a medical identification bracelet or chain if you are taking this medicine for seizures, and carry a card that lists all your medications. You may get drowsy, dizzy,   or have blurred vision. Do not drive, use machinery, or do anything that needs mental alertness until you know how this medicine affects you. To reduce dizzy or fainting spells, do not sit or stand up quickly, especially if you are an older patient. Alcohol can  increase drowsiness and dizziness. Avoid alcoholic drinks. Your mouth may get dry. Chewing sugarless gum or sucking hard candy, and drinking plenty of water will help. The use of this medicine may increase the chance of suicidal thoughts or actions. Pay special attention to how you are responding while on this medicine. Any worsening of mood, or thoughts of suicide or dying should be reported to your health care professional right away. Women who become pregnant while using this medicine may enroll in the [place] Antiepileptic Drug Pregnancy Registry by calling 1-888-233-2334. This registry collects information about the safety of antiepileptic drug use during pregnancy. What side effects may I notice from receiving this medicine? Side effects that you should report to your doctor or health care professional as soon as possible: -allergic reactions like skin rash, itching or hives, swelling of the face, lips, or tongue -worsening of mood, thoughts or actions of suicide or dying Side effects that usually do not require medical attention (report to your doctor or health care professional if they continue or are bothersome): -constipation -difficulty walking or controlling muscle movements -dizziness -nausea -slurred speech -tiredness -tremors -weight gain This list may not describe all possible side effects. Call your doctor for medical advice about side effects. You may report side effects to FDA at 1-800-FDA-1088. Where should I keep my medicine? Keep out of reach of children. This medicine may cause accidental overdose and death if it taken by other adults, children, or pets. Mix any unused medicine with a substance like cat litter or coffee grounds. Then throw the medicine away in a sealed container like a sealed bag or a coffee can with a lid. Do not use the medicine after the expiration date. Store at room temperature between 15 and 30 degrees C (59 and 86 degrees F). NOTE: This  sheet is a summary. It may not cover all possible information. If you have questions about this medicine, talk to your doctor, pharmacist, or health care provider.  2017 Elsevier/Gold Standard (2013-07-06 15:26:50)  

## 2016-07-07 NOTE — Progress Notes (Addendum)
[facility]  Provider:  Melvyn [name], M D  Referring Provider: Merri Brunette, MD Primary Care Physician:  Cheryl Found, MD  Chief Complaint  Patient presents with  . Follow-up    Rm 11. Patient states that she had a visit with Dr. Fritzi [name] and would like to duscuss that further. Has "whole body" pain. Tried Gabapentin which helped but made patient feel "looney".     HPI:  [name] is a 76 y.o. caucasain, married and meanwhile retired female . She is seen here as a referral  from Dr. Katrinka [name] for memory evaluation, characterized by verbal communication difficulties, subjective  word finding difficulties.   [name] has noted neologisms as well as paraphasic errors with increasing frequency over the last 6-7 years.  I obtained an MRI of the brain ( 09-19-13 )  with and without contrast to evaluate her for possible vascular injuries or focal brain atrophies which returned entirely normal. Actually her brain looked much younger than her numeric age would normally suggest. There were no normal lesions,  all ventricles were normal in size,  nor atrophy no mass effect and no significant white matter disease. The normal MRI was followed by a PET scan,  Fuorbetapir uptake did not indicate Alzheimer's Tangles, these news were a great relief. ( Dr. Amil [name] , 11-05-13) The patient was placed on a higher dose taper  of Prednisone for the treatment of joint pain by her [facility] Rheumatologist , and she noted a mental clarity that she craved during therapy.Trochanteric and knee arthralgias and bursitis cleared temporarily and she felt the reduced pain allowed her to function mentally better as well as physically. Her sleep was better , 7.5 hours on the prednisone.  She has exchanged words again after weaning off, told her daughter" to switch [name]'s doctor" instead of "diaper ". She noted that she writes fluently and when reading the product , sees neologims and paraphrasic  errors even than - word substitution . Could this be a primary progressive aphasia ? Could this be pain related, insomnia? She sleeps from 11 PM  to 5.30 AM right now with pain.    Interval history, 12-01-2015 [name]'s life has drastically changed with the unexpected passing of her Husband, [name], May 27 th 2016, of small cell lung cancer, identified only 3 month earlier. Her memory concerns are not dominating her life at this time.   I have the pleasure to see [name] today on 07/07/2016, we performed a Montral cognitive assessment test today which remains in normal range for this very educated and highly intelligent individual. She has [name] new joy and comfort in her life. She is involved in her granddaughter's upbringing and still supports her son., who is deaf. She has been treated for cervical degeneration related pain by dr. Doroteo [name], and he started her on gabapentin, 300 mg. She responded with confusion, placed her eye glases in a ziplock box, started cleaning and left in mid procedure, surprised when she re entered the room that all utensils were there.   If she took 100 mg tid, it may be possible to take 300 mg at night only?   CYMBALTA and LYRICA were poorly tolerated.    Montreal Cognitive Assessment  07/07/2016 12/01/2015 03/12/2014  Visuospatial/ Executive (0/5) 3 5 5   Naming (0/3) 3 3 3   Attention: Read list of digits (0/2) 2 2 2   Attention: Read list of letters (0/1) 1 1 1   Attention: Serial 7 subtraction starting at 100 (  0/3) 3 3 2   Language: Repeat phrase (0/2) 2 2 2   Language : Fluency (0/1) 1 1 1   Abstraction (0/2) 2 2 2   Delayed Recall (0/5) 5 5 5   Orientation (0/6) 6 6 6   Total 28 30 29   Adjusted Score (based on education) - 30 -     COMPLAINT: [name] describes how she has had difficulties recalling details from conversations, articles she may have read, perhaps even radio- broadcasts or information from books.  12-01-2015  CD She stated last year  that to be effective in her conversations, she would need to recall and be able to quote the author and edition of a certain publication, as she used to.  She was uncomfortable going to [employer]  on a mission for [name], as she could not get herself to retain the detail needed to discuss.  She is painfully aware of this deficit , as her command of language and details used to be excellent.  Her husband became more concerned about her tendency to change words or the ending of words- especially when using longer words.  She gave two examples: In a cool room , she told her husband she was "happy to wear her purse". She has used the word "perspection " instead of "perspective". Using neologism and parapharasias.  Her daughter noted her to be quiet in conversations that she usually would contribute to. She is afraid to get stuck. She is unaware that she changed the words, until she notices the reaction of the listener.   Her rheumatologist/ gynecologist  diagnosed her with sero-negative psoriatic arthritis and adrenal insufficiency. 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 This was in 2007-8.   This has become a concern - she states this is not delayed recall , it's :" A word behind a wall and no way to get to it ".  Her paternal aunts had dementia, her mother had no dementia in advanced age (63) .  Her sonhas meanwhile moved to [place] and she spent the summer with her now 70-53 year old granddaughter.   01-05-2016 I am meeting with [name] today to discuss the results of her recent nerve conduction studies and electromyography. [name]  discovered abnormal spontaneous activity was 3+ positive sharp waves and fibrillations between the right C6 and C7 vertebrae as well as those muscles that are innervated between the right C4 and C5 exiting nerves. This involves the deltoid, the  triceps, biceps and the flexor carpi radialis. The PT also explained to her that her trapezius is involved . We discussed today a referral to a physical medicine and rehab MD> I recommended Dr. Doroteo [name].   Review of Systems: Out of a complete 14 system review, the patient complains of only the following symptoms, and all other reviewed systems are negative.  Joint pain, post nasal drip, hoarseness, no laryngitis.   Social History   Social History  . Marital status: Married    Spouse name: [name]  . Number of children: 2  . Years of education: Masters   Occupational History  . 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 Smoking status: Former Smoker    Quit date: 05/24/1993  . Smokeless tobacco: Never Used  . Alcohol use 0.0 oz/week     Comment: 2 ounce red wine 3-4 wks  . Drug use: No  .  Sexual activity: Not on file   Other Topics Concern  . Not on file   Social History Narrative   Patient is married [name]([name] and lives at home with her husband.   Patient has two children.   Patient is retired.   Patient has a Master's degree.   Patient drinks two cups of coffee daily.   Patient is right-handed.          Family History  Problem Relation Age of Onset  . Esophageal cancer Father   . Stomach cancer Father   . Hypertension Father   . Colon cancer Mother   . Hypertension Mother   . Breast cancer Mother   . Heart attack Paternal Grandfather   . Diabetes Paternal Grandmother   . Liver cancer Maternal Grandmother   . Rectal cancer Neg Hx     Past Medical History:  Diagnosis Date  . Adrenal gland dysfunction (HCC)     dr Vincente PoliGrewal, coricare B   . Arthritis   . Blood transfusion without reported diagnosis   . Cataract   . chronic headaches   . Chronic kidney disease    hx bladder infections  . Chronic kidney disease   . Dysrhythmia    wore holter monitor  . Gastric polyps   . GERD (gastroesophageal reflux disease)   . History of migraine  headaches   . Hyperlipidemia   . Hypothyroidism   . Neuromuscular disease (HCC)    fibromyalgia  . Neuromuscular disorder (HCC)    chronic muscle pain- takes Progesterone  . Pneumonia   . PONV (postoperative nausea and vomiting)     Past Surgical History:  Procedure Laterality Date  . ABDOMINAL HYSTERECTOMY    . APPENDECTOMY     with hysterectomy  . CARDIAC CATHETERIZATION    . cataract surgery    . CHOLECYSTECTOMY    . COLONOSCOPY    . EYE SURGERY     cataract BIL  . HAND SURGERY     middle finger right hand  . INCONTINENCE SURGERY    . RECTOCELE REPAIR    . RHINOPLASTY    . SHOULDER ARTHROSCOPY WITH ROTATOR CUFF REPAIR AND SUBACROMIAL DECOMPRESSION Right 06/17/2015   Procedure: SHOULDER DIAGNOSTIC OPERATIVE ARTHROSCOPY WITH MINI-OPEN ROTATOR CUFF REPAIR AND SUBACROMIAL DECOMPRESSION;  Surgeon: Cammy CopaScott Gregory Dean, MD;  Location: [facility] OR;  Service: Orthopedics;  Laterality: Right;  . TONSILLECTOMY      Current Outpatient Prescriptions  Medication Sig Dispense Refill  . butalbital-aspirin-caffeine (FIORINAL) 50-325-40 MG per capsule Take 1 capsule by mouth every 6 (six) hours as needed (for headaches.).     Marland Kitchen. Cholecalciferol (VITAMIN D3) 1000 UNITS CAPS Take 1 capsule by mouth daily.     Marland Kitchen. CLIMARA 0.1 MG/24HR Place 1 patch onto the skin once a week.     . clobetasol (TEMOVATE) 0.05 % external solution Apply 1 application topically 2 (two) times daily as needed (psorasis).     . CRESTOR 5 MG tablet Take 5 mg by mouth daily.     . fluocinonide (LIDEX) 0.05 % external solution Apply 1 application topically 2 (two) times daily as needed (for psorasis.).     Marland Kitchen. hydroxychloroquine (PLAQUENIL) 200 MG tablet Take 400 mg by mouth at bedtime.   1  . Omega-3 Fatty Acids (FISH OIL) 1000 MG CAPS Take 1 capsule by mouth 2 (two) times daily.     . progesterone (PROMETRIUM) 100 MG capsule Take 100 mg by mouth at bedtime.     . sulfaSALAzine (AZULFIDINE)  500 MG EC tablet Take by mouth.    .  SYNTHROID 50 MCG tablet Take 50 mcg by mouth every morning.     . Testosterone Propionate POWD Place onto the skin.     No current facility-administered medications for this visit.     Allergies as of 07/07/2016 - Review Complete 07/07/2016  Allergen Reaction Noted  . Imitrex [sumatriptan] Nausea And Vomiting and Hypertension 03/10/2012  . Nsaids Other (See Comments) 03/12/2014  . Keflex [cephalexin] Nausea And Vomiting 03/12/2014    Vitals: BP (!) 110/58   Pulse 70   Resp 16   Ht 5' 5.5" (1.664 m)   Wt 136 lb (61.7 kg)   BMI 22.29 kg/m  Last Weight:  Wt Readings from Last 1 Encounters:  07/07/16 136 lb (61.7 kg)   Last Height:   Ht Readings from Last 1 Encounters:  07/07/16 5' 5.5" (1.664 m)    Physical exam:  General: The patient is awake, alert and appears not in acute distress. The patient is well groomed. Head: Normocephalic, atraumatic. Neck is supple. Mallampati 2 , neck circumference: 14, no TMJ, no nasal congestion.  Cardiovascular:  Regular rate and rhythm, without  murmurs or carotid bruit, and without distended neck veins. Respiratory: Lungs are clear to auscultation. Skin:  Without evidence of edema, or rash Trunk: this  patient has a normal posture.  Neurologic exam : The patient is awake and alert, oriented to place and time.     Family and patient subjective concerned about her memory :   Montreal Cognitive Assessment  07/07/2016 12/01/2015 03/12/2014  Visuospatial/ Executive (0/5) 3 5 5   Naming (0/3) 3 3 3   Attention: Read list of digits (0/2) 2 2 2   Attention: Read list of letters (0/1) 1 1 1   Attention: Serial 7 subtraction starting at 100 (0/3) 3 3 2   Language: Repeat phrase (0/2) 2 2 2   Language : Fluency (0/1) 1 1 1   Abstraction (0/2) 2 2 2   Delayed Recall (0/5) 5 5 5   Orientation (0/6) 6 6 6   Total 28 30 29   Adjusted Score (based on education) - 30 -    No alexia, but not sure about agraphia- she is needing to check her writing and  prefers the compute over handwriting for this reason.   Notebook user, list maker to have memory crutches.  There is a normal attention span & concentration ability.  Eloquent Speech is fluent without dysarthria, dysphonia or aphasia. She subjectively feels still impaired in immediate recall and memory of spoken information.   Mood and affect are appropriate. She feels I easily distracted.    Pupils remain  equal and briskly reactive to light. No nystagmus. Normal smooth pursuit , frontal release signs not present. No rooting , Visual fields by finger perimetry are intact. Hearing to finger rub intact.  Facial sensation intact to fine touch. Facial motor strength is symmetric and tongue and uvula move midline.  Sensory deficits with numbness of the outer toes outer 3 toes bilaterally both feet occurring simultaneously. This may be related to a lumbar degeneration.  I was able to review a report for a cervical spine MRI that Dr. Rise Paganini had ordered for the patient dated 05-27-15 it documented disc space loss which is not unusual for age but widespread degeneration with associated multifactorial moderate or severe neural foramina stenosis mostly on the right side - affecting between C4 and C5, C6 and C7.   Assessment:  After physical and neurologic examination, review  of laboratory studies, imaging, neurophysiology testing and pre-existing records, assessment is  Not a semantic aphasia but word finding difficulties.  Cervical DDD, rheumatic psoriatric arthritis.  generalized pain- treated with gabapentin. 300 mg a day.  Not experiencing drowsiness but confused while taking it - cognitive decline very concerning. Dr. Kirtland Bouchard reduced the gabapentin to 100 mg daily, pain returned and brain still 'foggy'.   Plan:  Treatment plan and additional workup : I like for this pleasant patient to have a detailed neuropsychological evaluation with a speech and language evaluation.  I referred to Dr. Doroteo [name],  Physical medicine and she continues. Dr. August Saucer and dr [name] feel her neck degeneration is cause for her pain.      Lehman Whiteley, MD  CC Rise Paganini, MD

## 2016-07-27 ENCOUNTER — Encounter: Payer: Medicare Other | Attending: Physical Medicine & Rehabilitation

## 2016-07-27 ENCOUNTER — Encounter: Payer: Self-pay | Admitting: Physical Medicine & Rehabilitation

## 2016-07-27 ENCOUNTER — Ambulatory Visit (HOSPITAL_BASED_OUTPATIENT_CLINIC_OR_DEPARTMENT_OTHER): Payer: Medicare Other | Admitting: Physical Medicine & Rehabilitation

## 2016-07-27 VITALS — BP 122/78 | HR 70 | Resp 14

## 2016-07-27 DIAGNOSIS — M47812 Spondylosis without myelopathy or radiculopathy, cervical region: Secondary | ICD-10-CM | POA: Diagnosis present

## 2016-07-27 DIAGNOSIS — M24511 Contracture, right shoulder: Secondary | ICD-10-CM | POA: Insufficient documentation

## 2016-07-27 DIAGNOSIS — M791 Myalgia: Secondary | ICD-10-CM | POA: Diagnosis not present

## 2016-07-27 DIAGNOSIS — M5010 Cervical disc disorder with radiculopathy, unspecified cervical region: Secondary | ICD-10-CM | POA: Diagnosis not present

## 2016-07-27 DIAGNOSIS — M797 Fibromyalgia: Secondary | ICD-10-CM | POA: Insufficient documentation

## 2016-07-27 DIAGNOSIS — M7918 Myalgia, other site: Secondary | ICD-10-CM | POA: Insufficient documentation

## 2016-07-27 MED ORDER — GABAPENTIN 100 MG PO CAPS: 200.0000 mg | ORAL_CAPSULE | Freq: Every day | ORAL | 5 refills | Status: DC

## 2016-07-27 NOTE — Progress Notes (Signed)
Subjective:    Patient ID: Cheryl QuillMary Ellen [name], female    DOB: 09/27/1940, 76 y.o.   MRN: [id number]  HPI Reviewed notes from [facility] Right trap area  Still taking occ 800mg  Ibuprofen  Has been on Gabapentin at night, took 100mg  at night but no relief.  Didn't tolerate higher dose due to memory issues but was encouraged to cont by neurology taking it with evening meal rather than qhs.  Seen by Ortho hand as well OT hand therapist Right middle finger deformity progressing no   Using voltaren gel on right shoulder and hand  Tried cymbalta for 3 days, skin felt funny  Some leg weakness noted after getting out of car,As improved after a couple minutes. She has no progressive weakness. While she is ambulating. In fact, she feels better after ambulating for a while. She does have some persistent numbness on the lateral border of each foot.  Pain Inventory Average Pain 6 Pain Right Now 3 My pain is intermittent, stabbing and aching  In the last 24 hours, has pain interfered with the following? General activity 4 Relation with others 2 Enjoyment of life 4 What TIME of day is your pain at its worst? evening Sleep (in general) Good  Pain is worse with: some activites Pain improves with: rest, heat/ice and medication Relief from Meds: 5  Mobility walk without assistance ability to climb steps?  yes do you drive?  yes  Function retired  Neuro/Psych weakness numbness tingling  Prior Studies Any changes since last visit?  no  Physicians involved in your care Any changes since last visit?  no   Family History  Problem Relation Age of Onset  . Esophageal cancer Father   . Stomach cancer Father   . Hypertension Father   . Colon cancer Mother   . Hypertension Mother   . Breast cancer Mother   . Heart attack Paternal Grandfather   . Diabetes Paternal Grandmother   . Liver cancer Maternal Grandmother   . Rectal cancer Neg Hx    Social History   Social  History  . Marital status: Married    Spouse name: Nedra HaiLee  . Number of children: 2  . Years of education: Masters   Occupational History  . 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 Smoking status: Former Smoker    Quit date: 05/24/1993  . Smokeless tobacco: Never Used  . Alcohol use 0.0 oz/week     Comment: 2 ounce red wine 3-4 wks  . Drug use: No  . Sexual activity: Not Asked   Other Topics Concern  . None   Social History Narrative   Patient is married [name] and lives at home with her husband.   Patient has two children.   Patient is retired.   Patient has a Master's degree.   Patient drinks two cups of coffee daily.   Patient is right-handed.         Past Surgical History:  Procedure Laterality Date  . ABDOMINAL HYSTERECTOMY    . APPENDECTOMY     with hysterectomy  . CARDIAC CATHETERIZATION    . cataract surgery    . CHOLECYSTECTOMY    . COLONOSCOPY    . EYE SURGERY     cataract BIL  . HAND SURGERY     middle finger right hand  . INCONTINENCE SURGERY    . RECTOCELE REPAIR    . RHINOPLASTY    . SHOULDER ARTHROSCOPY WITH ROTATOR CUFF  REPAIR AND SUBACROMIAL DECOMPRESSION Right 06/17/2015   Procedure: SHOULDER DIAGNOSTIC OPERATIVE ARTHROSCOPY WITH MINI-OPEN ROTATOR CUFF REPAIR AND SUBACROMIAL DECOMPRESSION;  Surgeon: Cammy Copa, MD;  Location: [facility] OR;  Service: Orthopedics;  Laterality: Right;  . TONSILLECTOMY     Past Medical History:  Diagnosis Date  . Adrenal gland dysfunction (HCC)     dr Vincente Poli, coricare B   . Arthritis   . Blood transfusion without reported diagnosis   . Cataract   . chronic headaches   . Chronic kidney disease    hx bladder infections  . Chronic kidney disease   . Dysrhythmia    wore holter monitor  . Gastric polyps   . GERD (gastroesophageal reflux disease)   . History of migraine headaches   . Hyperlipidemia   . Hypothyroidism   . Neuromuscular disease (HCC)    fibromyalgia  .  Neuromuscular disorder (HCC)    chronic muscle pain- takes Progesterone  . Pneumonia   . PONV (postoperative nausea and vomiting)    BP 122/78 (BP Location: Left Arm, Patient Position: Sitting, Cuff Size: Normal)   Pulse 70   Resp 14   SpO2 97%   Opioid Risk Score:   Fall Risk Score:  `1  Depression screen PHQ 2/9  Depression screen PHQ 2/9 03/16/2016  Decreased Interest 2  Down, Depressed, Hopeless 2  PHQ - 2 Score 4  Altered sleeping 1  Tired, decreased energy 1  Feeling bad or failure about yourself  0  Trouble concentrating 1  Moving slowly or fidgety/restless 0  Suicidal thoughts 0  PHQ-9 Score 7  Difficult doing work/chores Somewhat difficult    Review of Systems  HENT: Negative.   Eyes: Negative.   Respiratory: Negative.   Cardiovascular: Negative.   Gastrointestinal: Negative.   Endocrine: Negative.   Genitourinary: Negative.   Musculoskeletal: Negative.   Skin: Negative.   Allergic/Immunologic: Negative.   Neurological: Positive for weakness and numbness.       Tingling  Hematological: Negative.   Psychiatric/Behavioral: Negative.   All other systems reviewed and are negative.      Objective:   Physical Exam  Constitutional: She is oriented to person, place, and time. She appears well-developed and well-nourished.  HENT:  Head: Normocephalic and atraumatic.  Eyes: Conjunctivae are normal. Pupils are equal, round, and reactive to light.  Neurological: She is alert and oriented to person, place, and time.  Psychiatric: She has a normal mood and affect.  Nursing note and vitals reviewed.  Tenderness to palpation over the right infraspinatus and to lesser extent, levator scapula. Motor strength is 5/5 bilateral deltoid, biceps, triceps, grip, hip flexor, knee extensor, ankle dorsiflexor and plantar flexor. Cervical range of motion 75% flexion, extension, lateral bending and rotation. Lumbar range of motion 100% flexion, 75%, extension, lateral bending  and rotation. Right hand has valgus deformity at the PIP enlargement of PIP joint. No enlargement of MCP. No evidence of trigger finger. Does have mild tenderness just proximal to the MCP and third digit. Gait is normal       Assessment & Plan:   1.  Fibromyalgia- gabapentin 200 mg daily at bedtime, Continue exercise program. Unable to tolerate daytime doses of gabapentin. Doubt if patient would be able to tolerate Lyrica and duloxetine has already been tried at low dose and patient had unusual skin sensations  2.  Myofascial trigger points, recently underwent trigger point injections. Does have some tenderness around the infraspinatus but would hold off on repeat for at  least one month.  3. Paresthesias are most likely related to her fibromyalgia, although neuropathy is possible. Given intermittent nature would hold off on EMG/NCV at the current time  Over half of the 25 min visit was spent counseling and coordinating care.

## 2016-07-27 NOTE — Patient Instructions (Signed)
Trigger point in one month

## 2016-08-24 ENCOUNTER — Encounter: Payer: Medicare Other | Attending: Physical Medicine & Rehabilitation

## 2016-08-24 ENCOUNTER — Ambulatory Visit (HOSPITAL_BASED_OUTPATIENT_CLINIC_OR_DEPARTMENT_OTHER): Payer: Medicare Other | Admitting: Physical Medicine & Rehabilitation

## 2016-08-24 ENCOUNTER — Encounter: Payer: Self-pay | Admitting: Physical Medicine & Rehabilitation

## 2016-08-24 VITALS — BP 120/74 | HR 63 | Resp 14

## 2016-08-24 DIAGNOSIS — M24511 Contracture, right shoulder: Secondary | ICD-10-CM | POA: Insufficient documentation

## 2016-08-24 DIAGNOSIS — M47812 Spondylosis without myelopathy or radiculopathy, cervical region: Secondary | ICD-10-CM | POA: Insufficient documentation

## 2016-08-24 DIAGNOSIS — M7918 Myalgia, other site: Secondary | ICD-10-CM

## 2016-08-24 DIAGNOSIS — M791 Myalgia: Secondary | ICD-10-CM

## 2016-08-24 DIAGNOSIS — M5010 Cervical disc disorder with radiculopathy, unspecified cervical region: Secondary | ICD-10-CM | POA: Insufficient documentation

## 2016-08-24 NOTE — Progress Notes (Signed)
Trigger Point Injection  Indication: Right cervical and parascapular Myofascial pain not relieved by medication management and other conservative care.  Informed consent was obtained after describing risk and benefits of the procedure with the patient, this includes bleeding, bruising, infection and medication side effects.  The patient wishes to proceed and has given written consent.  The patient was placed in a Seated position.  The Right levator scapula, Infraspinatus and rhomboid parascapular area was marked and prepped with Betadine.  It was entered with a 25-gauge 1-1/2 inch needle and 1 mL of 1% lidocaine was injected into each of 3 trigger points, after negative draw back for blood.  The patient tolerated the procedure well.  Post procedure instructions were given.

## 2016-11-29 ENCOUNTER — Ambulatory Visit: Payer: Medicare Other | Admitting: Physical Medicine & Rehabilitation

## 2016-12-16 ENCOUNTER — Ambulatory Visit: Payer: Medicare Other | Admitting: Physical Medicine & Rehabilitation

## 2017-01-04 ENCOUNTER — Ambulatory Visit: Payer: Medicare Other | Admitting: Neurology

## 2017-01-18 ENCOUNTER — Encounter: Payer: Self-pay | Admitting: Physical Medicine & Rehabilitation

## 2017-01-18 ENCOUNTER — Encounter: Payer: Medicare Other | Attending: Physical Medicine & Rehabilitation

## 2017-01-18 ENCOUNTER — Ambulatory Visit (HOSPITAL_BASED_OUTPATIENT_CLINIC_OR_DEPARTMENT_OTHER): Payer: Medicare Other | Admitting: Physical Medicine & Rehabilitation

## 2017-01-18 VITALS — BP 110/70 | HR 72

## 2017-01-18 DIAGNOSIS — M791 Myalgia: Secondary | ICD-10-CM

## 2017-01-18 DIAGNOSIS — M542 Cervicalgia: Secondary | ICD-10-CM | POA: Diagnosis present

## 2017-01-18 DIAGNOSIS — R51 Headache: Secondary | ICD-10-CM | POA: Diagnosis not present

## 2017-01-18 DIAGNOSIS — K219 Gastro-esophageal reflux disease without esophagitis: Secondary | ICD-10-CM | POA: Insufficient documentation

## 2017-01-18 DIAGNOSIS — E785 Hyperlipidemia, unspecified: Secondary | ICD-10-CM | POA: Insufficient documentation

## 2017-01-18 DIAGNOSIS — E279 Disorder of adrenal gland, unspecified: Secondary | ICD-10-CM | POA: Insufficient documentation

## 2017-01-18 DIAGNOSIS — E039 Hypothyroidism, unspecified: Secondary | ICD-10-CM | POA: Insufficient documentation

## 2017-01-18 DIAGNOSIS — N189 Chronic kidney disease, unspecified: Secondary | ICD-10-CM | POA: Diagnosis not present

## 2017-01-18 DIAGNOSIS — G8929 Other chronic pain: Secondary | ICD-10-CM | POA: Diagnosis present

## 2017-01-18 DIAGNOSIS — M47812 Spondylosis without myelopathy or radiculopathy, cervical region: Secondary | ICD-10-CM

## 2017-01-18 DIAGNOSIS — G709 Myoneural disorder, unspecified: Secondary | ICD-10-CM | POA: Diagnosis not present

## 2017-01-18 DIAGNOSIS — M7918 Myalgia, other site: Secondary | ICD-10-CM

## 2017-01-18 MED ORDER — GABAPENTIN 100 MG PO CAPS: 200.0000 mg | ORAL_CAPSULE | Freq: Every day | ORAL | 5 refills | Status: DC

## 2017-01-18 NOTE — Progress Notes (Signed)
Subjective:    Patient ID: [name], female    DOB: 20-Mar-1941, 76 y.o.   MRN: [id number]  HPI   76 year old female with chronic neck pain. She's been followed at this clinic for approximately 9 months. She has completed physical therapy in the past. Neck pain is mainly right-sided. We reviewed her MRI once again going over the anatomy of the area  Past history is also significant for psoriatic arthritis as well as fibromyalgia  Trigger pt injection Right levator , Infraspinatus, rhomboid injection 08/24/2016, caused extra pain for ~2wks  Infraspinatus area did respond  Had flare up in July 2018, which subsided with gabapentin 300mg  for 4-5 days Then she resumed 200mg    Now is seeing rheumatology at [facility]. Pain Inventory Average Pain 4 Pain Right Now 4 My pain is intermittent, sharp and stabbing  In the last 24 hours, has pain interfered with the following? General activity 2 Relation with others 0 Enjoyment of life 2 What TIME of day is your pain at its worst? all Sleep (in general) Fair  Pain is worse with: some activites Pain improves with: heat/ice, therapy/exercise and medication Relief from Meds: 6  Mobility walk without assistance do you drive?  yes  Function retired  Neuro/Psych dizziness  Prior Studies Any changes since last visit?  no  Physicians involved in your care Any changes since last visit?  no   Family History  Problem Relation Age of Onset  . Esophageal cancer Father   . Stomach cancer Father   . Hypertension Father   . Colon cancer Mother   . Hypertension Mother   . Breast cancer Mother   . Heart attack Paternal Grandfather   . Diabetes Paternal Grandmother   . Liver cancer Maternal Grandmother   . Rectal cancer Neg Hx    Social History   Social History  . Marital status: Married    Spouse name: [name]  . Number of children: 2  . Years of education: Masters   Occupational History  . 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 Smoking status: Former Smoker    Quit date: 05/24/1993  . Smokeless tobacco: Never Used  . Alcohol use 0.0 oz/week     Comment: 2 ounce red wine 3-4 wks  . Drug use: No  . Sexual activity: Not on file   Other Topics Concern  . Not on file   Social History Narrative   Patient is married [name]) and lives at home with her husband.   Patient has two children.   Patient is retired.   Patient has a Master's degree.   Patient drinks two cups of coffee daily.   Patient is right-handed.         Past Surgical History:  Procedure Laterality Date  . ABDOMINAL HYSTERECTOMY    . APPENDECTOMY     with hysterectomy  . CARDIAC CATHETERIZATION    . cataract surgery    . CHOLECYSTECTOMY    . COLONOSCOPY    . EYE SURGERY     cataract BIL  . HAND SURGERY     middle finger right hand  . INCONTINENCE SURGERY    . RECTOCELE REPAIR    . RHINOPLASTY    . SHOULDER ARTHROSCOPY WITH ROTATOR CUFF REPAIR AND SUBACROMIAL DECOMPRESSION Right 06/17/2015   Procedure: SHOULDER DIAGNOSTIC OPERATIVE ARTHROSCOPY WITH MINI-OPEN ROTATOR CUFF REPAIR AND SUBACROMIAL DECOMPRESSION;  Surgeon: Cammy Copa, MD;  Location: [facility] OR;  Service: Orthopedics;  Laterality: Right;  . TONSILLECTOMY     Past Medical History:  Diagnosis Date  . Adrenal gland dysfunction (HCC)     dr Vincente Poli, coricare B   . Arthritis   . Blood transfusion without reported diagnosis   . Cataract   . chronic headaches   . Chronic kidney disease    hx bladder infections  . Chronic kidney disease   . Dysrhythmia    wore holter monitor  . Gastric polyps   . GERD (gastroesophageal reflux disease)   . History of migraine headaches   . Hyperlipidemia   . Hypothyroidism   . Neuromuscular disease (HCC)    fibromyalgia  . Neuromuscular disorder (HCC)    chronic muscle pain- takes Progesterone  . Pneumonia   . PONV (postoperative nausea and vomiting)    There were no vitals taken for this  visit.  Opioid Risk Score:   Fall Risk Score:  `1  Depression screen PHQ 2/9  Depression screen PHQ 2/9 03/16/2016  Decreased Interest 2  Down, Depressed, Hopeless 2  PHQ - 2 Score 4  Altered sleeping 1  Tired, decreased energy 1  Feeling bad or failure about yourself  0  Trouble concentrating 1  Moving slowly or fidgety/restless 0  Suicidal thoughts 0  PHQ-9 Score 7  Difficult doing work/chores Somewhat difficult     Review of Systems  Constitutional: Negative.   HENT: Negative.   Eyes: Negative.   Respiratory: Negative.   Cardiovascular: Negative.   Gastrointestinal: Negative.   Endocrine: Negative.   Genitourinary: Negative.   Musculoskeletal: Negative.   Skin: Negative.   Allergic/Immunologic: Negative.   Neurological: Negative.   Hematological: Negative.   Psychiatric/Behavioral: Negative.   All other systems reviewed and are negative.      Objective:   Physical Exam  Constitutional: She is oriented to person, place, and time. She appears well-developed and well-nourished.  HENT:  Head: Normocephalic and atraumatic.  Eyes: Pupils are equal, round, and reactive to light.  Cardiovascular: Normal rate, regular rhythm and normal heart sounds.   No murmur heard. Pulmonary/Chest: Effort normal and breath sounds normal. No respiratory distress. She has no wheezes.  Abdominal: Soft. Bowel sounds are normal. She exhibits no distension. There is no tenderness.  Neurological: She is alert and oriented to person, place, and time.  Skin: Skin is warm and dry.  Psychiatric: She has a normal mood and affect.  Nursing note and vitals reviewed.         Assessment & Plan:  1. Chronic right-sided neck pain which is multifactorial. She does not have any radicular pain. However, she does have probable facet mediated pain as well as myofascial pain. We discussed treatment options including medication management. She does get some relief with the gabapentin. Will set her  up with right-sided C4, C5, C6 medial branch blocks

## 2017-01-18 NOTE — Patient Instructions (Addendum)
Cervical facet joint pain may respond to cervical medial branch blocks

## 2017-02-15 ENCOUNTER — Ambulatory Visit (INDEPENDENT_AMBULATORY_CARE_PROVIDER_SITE_OTHER): Payer: Medicare Other | Admitting: Neurology

## 2017-02-15 ENCOUNTER — Encounter: Payer: Self-pay | Admitting: Neurology

## 2017-02-15 VITALS — BP 110/60 | HR 75 | Ht 65.0 in | Wt 132.0 lb

## 2017-02-15 DIAGNOSIS — R413 Other amnesia: Secondary | ICD-10-CM | POA: Diagnosis not present

## 2017-02-15 NOTE — Progress Notes (Signed)
[facility]  Provider:  Melvyn Novas, M D  Referring Provider: Merri Brunette, MD Primary Care Physician:  Merri Brunette, MD  Chief Complaint  Patient presents with  . Follow-up    pt alone, room 10.     HPI:  02-15-2017, 76 year old  patient just returned from her home state of [place], stayed with her daughter after a surgery, took care of the 28 years old granddaughter. In June her son moved finally out of her home, and in July she meet a man whom she likes a lot. She is reflecting a new optimism. Her cognitive function is still intact by [facility], but she recalled that Dr. Leonides Cave had found a mild impairment years ago. She feels better on gabapentin , since June has been more energetic. She reports hand and foot pain. She has degenerative joint disease- probably psoriatic arthritis - seronegative , her fingers are deformed. Arch and toe pain.   Dr Frederik Schmidt at [facility] follows her.     2015, CD  [name] is a caucasian, married and meanwhile retired female . She is seen here as a referral  from Dr. Katrinka Blazing for memory evaluation, characterized by verbal communication difficulties, subjective word finding difficulties. [name] has noted neologisms as well as paraphasic errors with increasing frequency over the last 6-7 years. I obtained an MRI of the brain ( 09-19-13 )  with and without contrast to evaluate her for possible vascular injuries or focal brain atrophies which returned entirely normal. Actually her brain looked much younger than her numeric age would normally suggest. There were no normal lesions,  all ventricles were normal in size,  nor atrophy no mass effect and no significant white matter disease.The normal MRI was followed by a PET scan,  Fuorbetapir uptake did not indicate Alzheimer's Tangles, these news were a great relief. ( Dr. Amil Amen , 11-05-13).The patient was placed on a higher dose taper  of Prednisone for the treatment of joint pain by her [facility]  Rheumatologist , and she noted a mental clarity that she craved during therapy.Trochanteric and knee arthralgias and bursitis cleared temporarily and she felt the reduced pain allowed her to function mentally better as well as physically. Her sleep was better , 7.5 hours on the prednisone.  She has exchanged words again after weaning off, told her daughter" to switch [name]'s doctor" instead of "diaper ". She noted that she writes fluently and when reading the product , sees neologims and paraphrasic errors even than - word substitution . Could this be a primary progressive aphasia ? Could this be pain related, insomnia? She sleeps from 11 PM  to 5.30 AM right now with pain.    Interval history, 12-01-2015 [name]'s life has drastically changed with the unexpected passing of her Husband, [name], May 27 th 2016, of small cell lung cancer, identified only 3 month earlier. Her memory concerns are not dominating her life at this time.   I have the pleasure to see [name] today on 07/07/2016, we performed a Montral cognitive assessment test today which remains in normal range for this very educated and highly intelligent individual.She has found new joy and comfort in her life. She is involved in her granddaughter's upbringing and still supports her son., who is deaf and lives in her home .She has been treated for cervical degeneration related pain by Dr. Doroteo Bradford, and he started her on gabapentin, 300 mg.  She reports spells of confusion, she placed her eye glases  in a ziplock box, started cleaning and left in mid procedure, surprised when she re- entered the room that all utensils were there.   If she took 100 mg tid, it may be possible to take 300 mg at night only? CYMBALTA and LYRICA were poorly tolerated.    Montreal Cognitive Assessment  02/15/2017 07/07/2016 12/01/2015 03/12/2014  Visuospatial/ Executive (0/5) Naming (0/3) Attention: Read list of digits (0/2) Attention: Read list of letters (0/1) Attention: Serial 7 subtraction starting at 100 (0/3) Language: Repeat phrase (0/2) Language : Fluency (0/1) Abstraction (0/2) Delayed Recall (0/5) Orientation (0/6) Total Adjusted Score (based on education) - - 30 -     COMPLAINT: [name] describes how she has had difficulties recalling details from conversations, articles she may have read, perhaps even radio- broadcasts or information from books.  12-01-2015  CD She stated last year that to be effective in her conversations, she would need to recall and be able to quote the author and edition of a certain publication, as she used to.  She was uncomfortable going to [employer]  on a mission for [employer], as she could not get herself to retain the detail needed to discuss.  She is painfully aware of this deficit , as her command of language and details used to be excellent.  Her husband became more concerned about her tendency to change words or the ending of words- especially when using longer words.  She gave two examples: In a cool room , she told her husband she was "happy to wear her purse". She has used the word "perspection " instead of "perspective". Using neologism and parapharasias.  Her daughter noted her to be quiet in conversations that she usually would contribute to. She is afraid to get stuck. She is unaware that she changed the words, until she notices the reaction of the listener.   Her rheumatologist/ gynecologist  diagnosed her with sero-negative psoriatic arthritis and adrenal insufficiency. 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 This was in 2007-8.   This has become a concern - she states this is not delayed recall , it's :" A word behind a wall and no way to get to it ".  Her paternal aunts had  dementia, her mother had no dementia in advanced age (53) .  Her sonhas meanwhile moved to [place] and she spent the summer with her now 65-50 year old granddaughter.   01-05-2016 I am meeting with [name] today to discuss the results of her recent nerve conduction studies and electromyography. Dr. Joycelyn Schmid  discovered abnormal spontaneous activity was 3+ positive sharp waves and fibrillations between the right C6 and C7 vertebrae as well as those muscles that are innervated between the right C4 and C5 exiting nerves. This involves the deltoid, the triceps, biceps and the flexor carpi radialis. The PT also explained to her that her trapezius is involved . We discussed today a referral to a physical medicine and rehab MD> I recommended Dr. Doroteo Bradford.   Review of Systems: Out of a complete 14 system review, the patient  complains of only the following symptoms, and all other reviewed systems are negative.  Joint pain, post nasal drip, hoarseness, no laryngitis.   Social History   Social History  . Marital status: Married    Spouse name: [name]  . Number of children: 2  . Years of education: Masters   Occupational History  . 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 Smoking status: Former Smoker    Quit date: 05/24/1993  . Smokeless tobacco: Never Used  . Alcohol use 0.0 oz/week     Comment: 2 ounce red wine 3-4 wks  . Drug use: No  . Sexual activity: Not on file   Other Topics Concern  . Not on file   Social History Narrative   Patient is married [name]) and lives at home with her husband.   Patient has two children.   Patient is retired.   Patient has a Master's degree.   Patient drinks two cups of coffee daily.   Patient is right-handed.          Family History  Problem Relation Age of Onset  . Esophageal cancer Father   . Stomach cancer Father   . Hypertension Father   . Colon cancer Mother   . Hypertension Mother   . Breast cancer  Mother   . Heart attack Paternal Grandfather   . Diabetes Paternal Grandmother   . Liver cancer Maternal Grandmother   . Rectal cancer Neg Hx     Past Medical History:  Diagnosis Date  . Adrenal gland dysfunction (HCC)     dr Vincente Poli, coricare B   . Arthritis   . Blood transfusion without reported diagnosis   . Cataract   . chronic headaches   . Chronic kidney disease    hx bladder infections  . Chronic kidney disease   . Dysrhythmia    wore holter monitor  . Gastric polyps   . GERD (gastroesophageal reflux disease)   . History of migraine headaches   . Hyperlipidemia   . Hypothyroidism   . Neuromuscular disease (HCC)    fibromyalgia  . Neuromuscular disorder (HCC)    chronic muscle pain- takes Progesterone  . Pneumonia   . PONV (postoperative nausea and vomiting)     Past Surgical History:  Procedure Laterality Date  . ABDOMINAL HYSTERECTOMY    . APPENDECTOMY     with hysterectomy  . CARDIAC CATHETERIZATION    . cataract surgery    . CHOLECYSTECTOMY    . COLONOSCOPY    . EYE SURGERY     cataract BIL  . HAND SURGERY     middle finger right hand  . INCONTINENCE SURGERY    . RECTOCELE REPAIR    . RHINOPLASTY    . SHOULDER ARTHROSCOPY WITH ROTATOR CUFF REPAIR AND SUBACROMIAL DECOMPRESSION Right 06/17/2015   Procedure: SHOULDER DIAGNOSTIC OPERATIVE ARTHROSCOPY WITH MINI-OPEN ROTATOR CUFF REPAIR AND SUBACROMIAL DECOMPRESSION;  Surgeon: Cammy Copa, MD;  Location: [facility] OR;  Service: Orthopedics;  Laterality: Right;  . TONSILLECTOMY      Current Outpatient Prescriptions  Medication Sig Dispense Refill  . butalbital-aspirin-caffeine (FIORINAL) 50-325-40 MG per capsule Take 1 capsule by mouth every 6 (six) hours as needed (for headaches.).     Marland Kitchen Cholecalciferol (VITAMIN D3) 1000 UNITS CAPS Take 1 capsule by mouth daily.     Marland Kitchen CLIMARA 0.1 MG/24HR Place 1 patch onto the skin once a week.     . clobetasol (TEMOVATE) 0.05 % external solution  Apply 1 application  topically 2 (two) times daily as needed (psorasis).     . CRESTOR 5 MG tablet Take 5 mg by mouth daily.     . fluocinonide (LIDEX) 0.05 % external solution Apply 1 application topically 2 (two) times daily as needed (for psorasis.).     Marland Kitchen gabapentin (NEURONTIN) 100 MG capsule Take 2 capsules (200 mg total) by mouth at bedtime. 60 capsule 5  . hydroxychloroquine (PLAQUENIL) 200 MG tablet Take 300 mg by mouth at bedtime.   1  . progesterone (PROMETRIUM) 100 MG capsule Take 100 mg by mouth at bedtime.     . sulfaSALAzine (AZULFIDINE) 500 MG EC tablet Take by mouth 2 (two) times daily.     Marland Kitchen SYNTHROID 50 MCG tablet Take 50 mcg by mouth every morning.     . Testosterone Propionate POWD Place 2 mg onto the skin.      No current facility-administered medications for this visit.     Allergies as of 02/15/2017 - Review Complete 02/15/2017  Allergen Reaction Noted  . Imitrex [sumatriptan] Nausea And Vomiting and Hypertension 03/10/2012  . Nsaids Other (See Comments) 03/12/2014  . Keflex [cephalexin] Nausea And Vomiting 03/12/2014    Vitals: BP 110/60   Pulse 75   Ht  (1.651 m)   Wt 132 lb (59.9 kg)   BMI 21.97 kg/m  Last Weight:  Wt Readings from Last 1 Encounters:  02/15/17 132 lb (59.9 kg)   Last Height:   Ht Readings from Last 1 Encounters:  02/15/17  (1.651 m)    Physical exam:  General: The patient is awake, alert and appears not in acute distress. The patient is well groomed. Head: Normocephalic, atraumatic. Neck is supple. Mallampati 2 , neck circumference: 14, no TMJ, no nasal congestion.  Cardiovascular:  Regular rate and rhythm, without  murmurs or carotid bruit, and without distended neck veins. Respiratory: Lungs are clear to auscultation. Skin:  Without evidence of edema, or rash Trunk: this  patient has a normal posture.  Neurologic exam : The patient is awake and alert, oriented to place and time.   She has mild hand tremor. Swollen interphalangeal  joints.    Family and patient subjective concerned about her memory :   Montreal Cognitive Assessment  02/15/2017 07/07/2016 12/01/2015 03/12/2014  Visuospatial/ Executive (0/5) Naming (0/3) Attention: Read list of digits (0/2) Attention: Read list of letters (0/1) Attention: Serial 7 subtraction starting at 100 (0/3) Language: Repeat phrase (0/2) Language : Fluency (0/1) Abstraction (0/2) Delayed Recall (0/5) Orientation (0/6) Total Adjusted Score (based on education) - - 30 -    No alexia, but not sure about agraphia- she is needing to check her writing and prefers the compute over handwriting for this reason.   Notebook user, list maker to have memory crutches.  There is a normal attention span & concentration ability.  Eloquent Speech is fluent without dysarthria, dysphonia or aphasia. She subjectively feels still impaired in immediate recall and memory of spoken information.   Mood and affect are appropriate. She feels I easily distracted.    Pupils remain  equal and briskly reactive to light. No nystagmus. Normal smooth pursuit ,  frontal release signs not present. No rooting , Visual fields by finger perimetry are intact. Hearing to finger rub intact.  Facial sensation intact to fine touch. Facial motor strength is symmetric and tongue and uvula move midline.  Sensory deficits with numbness of the outer toes outer 3 toes bilaterally both feet occurring simultaneously. This may be related to a lumbar degeneration.  I was able to review a report for a cervical spine MRI that Dr. Rise Paganini had ordered for the patient dated 05-27-15 it documented disc space loss which is not unusual for age but widespread degeneration with associated multifactorial moderate or severe neural foramina stenosis mostly on the right side - affecting between C4 and C5, C6 and C7.   Assessment:  After  physical and neurologic examination, review of laboratory studies, imaging, neurophysiology testing and pre-existing records, assessment is  Not a semantic aphasia but word finding difficulties.  Cervical DDD, rheumatic psoriatric arthritis.  generalized pain- treated with gabapentin. 300 mg a day. Sometimes takes 2 -   Not experiencing drowsiness but confused while taking it - cognitive decline very concerning. Dr. Kirtland Bouchard reduced the gabapentin to 100 mg daily, pain returned and brain still 'foggy'.   Plan:  Treatment plan and additional workup : I like for this pleasant patient to have a detailed neuropsychological evaluation with a speech and language evaluation.  I referred to Dr. Doroteo Bradford, Physical medicine and she continues.  Dr. August Saucer and Dr Doroteo Bradford feel her neck degeneration is cause for her pain.  Dr Frederik Schmidt at [facility] will follow her rheumatological condition. I would appreciate freed back from him, copy of notes.    Melvyn Novas, MD  CC: Dr  Garnette Scheuermann,  Rise Paganini, MD

## 2017-02-15 NOTE — Patient Instructions (Signed)
Psoriasis  Psoriasis is a long-term (chronic) skin condition. It causes raised, red patches (plaques) on your skin that look silvery. The red patches may show up anywhere on your body. They can be any size or shape.  Psoriasis can come and go. It can range from mild to very bad. It cannot be passed from one person to another (not contagious). There is no cure for this condition, but it can be helped with treatment.  Follow these instructions at home:  Skin Care   Apply moisturizers to your skin as needed. Only use those that your doctor has said are okay.   Apply cool compresses to the affected areas.   Do not scratch your skin.  Lifestyle     Do not use tobacco products. This includes cigarettes, chewing tobacco, and e-cigarettes. If you need help quitting, ask your doctor.   Drink little or no alcohol.   Try to lower your stress. Meditation or yoga may help.   Get sun as told by your doctor. Do not get sunburned.   Think about joining a psoriasis support group.  Medicines   Take or use over-the-counter and prescription medicines only as told by your doctor.   If you were prescribed an antibiotic, take or use it as told by your doctor. Do not stop taking the antibiotic even if your condition starts to get better.  General instructions   Keep a journal. Use this to help track what triggers an outbreak. Try to avoid any triggers.   See a counselor or social worker if you feel very sad, upset, or hopeless about your condition and these feelings affect your work or relationships.   Keep all follow-up visits as told by your doctor. This is important.  Contact a doctor if:   Your pain gets worse.   You have more redness or warmth in the affected areas.   You have new pain or stiffness in your joints.   Your pain or stiffness in your joints gets worse.   Your nails start to break easily.   Your nails pull away from the nail bed easily.   You have a fever.   You feel very sad (depressed).  This  information is not intended to replace advice given to you by your health care provider. Make sure you discuss any questions you have with your health care provider.  Document Released: 06/17/2004 Document Revised: 10/16/2015 Document Reviewed: 09/25/2014  Elsevier Interactive Patient Education  2018 [employer]

## 2017-02-22 ENCOUNTER — Encounter: Payer: Self-pay | Admitting: Gastroenterology

## 2017-03-31 ENCOUNTER — Encounter: Payer: Self-pay | Admitting: Gastroenterology

## 2017-05-03 ENCOUNTER — Ambulatory Visit: Payer: Self-pay | Admitting: Orthopedic Surgery

## 2017-05-30 ENCOUNTER — Ambulatory Visit: Payer: Medicare Other | Admitting: Gastroenterology

## 2017-05-30 ENCOUNTER — Encounter: Payer: Self-pay | Admitting: Gastroenterology

## 2017-05-30 VITALS — BP 100/60 | HR 84 | Ht 65.0 in | Wt 127.1 lb

## 2017-05-30 DIAGNOSIS — Z800 Family history of malignant neoplasm of digestive organs: Secondary | ICD-10-CM

## 2017-05-30 DIAGNOSIS — R12 Heartburn: Secondary | ICD-10-CM

## 2017-05-30 NOTE — Progress Notes (Signed)
Review of pertinent gastrointestinal problems: 1. FH of colon cancer: mother with colon cancer at age 8180s.  Colonoscopy with Dr. Loreta AveMann 2007 found a hyperplastic polyp.  Colonoscopy Dr. Christella HartiganJacobs 2013 was incomplete due to significantly tortuous colon, looping of the colonoscope.  Follow-up with "virtual" colonoscopy found no obvious polyps or cancers in the colon.    HPI: This is a very pleasant 77 year old woman whom I last saw about 5 years ago the time of a colonoscopy.  See those results summarized above  Mother diagnosed in her 5780s with colon cancer.  Bowels are absolutely fine.  No overt bleeding.    Started mag oxide for rheum issues.  Joints much better since sulfasalazine and plaquenil   Intermittent pyrosis; eating crackers helps. If not then OTC prilosec for 1-2 weeks.  Chief complaint is family history of colon cancer  ROS: complete GI ROS as described in HPI, all other review negative.  Constitutional:  No unintentional weight loss   Past Medical History:  Diagnosis Date  . Adrenal gland dysfunction (HCC)     dr Vincente PoliGrewal, [facility]   . Blood transfusion without reported diagnosis   . Cataract   . chronic headaches   . Chronic kidney disease    hx bladder infections  . Colon polyp   . Dysrhythmia    wore holter monitor  . Gastric polyps   . GERD (gastroesophageal reflux disease)   . History of migraine headaches   . Hyperlipidemia   . Hypothyroidism   . Neuromuscular disease (HCC)    fibromyalgia  . Pneumonia   . PONV (postoperative nausea and vomiting)   . Psoriatic arthritis [facility](HCC)     Past Surgical History:  Procedure Laterality Date  . ABDOMINAL HYSTERECTOMY    . APPENDECTOMY     with hysterectomy  . CARDIAC CATHETERIZATION    . CATARACT EXTRACTION, BILATERAL    . CHOLECYSTECTOMY    . COLONOSCOPY    . FINGER SURGERY     middle finger right hand  . INCONTINENCE SURGERY    . RECTOCELE REPAIR    . RHINOPLASTY    . SHOULDER ARTHROSCOPY WITH ROTATOR  CUFF REPAIR AND SUBACROMIAL DECOMPRESSION Right 06/17/2015   Procedure: SHOULDER DIAGNOSTIC OPERATIVE ARTHROSCOPY WITH MINI-OPEN ROTATOR CUFF REPAIR AND SUBACROMIAL DECOMPRESSION;  Surgeon: Cammy CopaScott Gregory Dean, MD;  Location: [facility] OR;  Service: Orthopedics;  Laterality: Right;  . TONSILLECTOMY      Current Outpatient Medications  Medication Sig Dispense Refill  . butalbital-aspirin-caffeine (FIORINAL) 50-325-40 MG per capsule Take 1 capsule by mouth every 6 (six) hours as needed (for headaches.).     Marland Kitchen. Cholecalciferol (VITAMIN D3) 1000 UNITS CAPS Take 1 capsule by mouth daily.     Marland Kitchen. CLIMARA 0.1 MG/24HR Place 1 patch onto the skin once a week.     . clobetasol (TEMOVATE) 0.05 % external solution Apply 1 application topically 2 (two) times daily as needed (psorasis).     . CRESTOR 5 MG tablet Take 5 mg by mouth daily.     . fluocinonide (LIDEX) 0.05 % external solution Apply 1 application topically 2 (two) times daily as needed (for psorasis.).     Marland Kitchen. gabapentin (NEURONTIN) 100 MG capsule Take 2 capsules (200 mg total) by mouth at bedtime. 60 capsule 5  . hydroxychloroquine (PLAQUENIL) 200 MG tablet Take 300 mg by mouth at bedtime.   1  . Magnesium Oxide 250 MG TABS Take by mouth. Take 1 tablet by mouth in the morning and 2 tablets at  night    . progesterone (PROMETRIUM) 100 MG capsule Take 100 mg by mouth at bedtime.     . sulfaSALAzine (AZULFIDINE) 500 MG EC tablet Take by mouth 2 (two) times daily.     Marland Kitchen SYNTHROID 50 MCG tablet Take 50 mcg by mouth every morning.     . Testosterone Propionate POWD Place 2 mg onto the skin.      No current facility-administered medications for this visit.     Allergies as of 05/30/2017 - Review Complete 05/30/2017  Allergen Reaction Noted  . Imitrex [sumatriptan] Nausea And Vomiting and Hypertension 03/10/2012  . Nsaids Other (See Comments) 03/12/2014  . Keflex [cephalexin] Nausea And Vomiting 03/12/2014    Family History  Problem Relation Age of Onset  .  Esophageal cancer Father   . Stomach cancer Father   . Hypertension Father   . Colon cancer Mother   . Breast cancer Mother   . Depression Mother   . Anxiety disorder Mother   . Heart attack Paternal Grandfather   . Diabetes Paternal Grandmother   . Liver cancer Maternal Grandmother   . Rectal cancer Neg Hx     Social History   Socioeconomic History  . Marital status: Married    Spouse name: [name]  . Number of children: 2  . Years of education: Masters  . Highest education level: Not on file  Social Needs  . Financial resource strain: Not on file  . Food insecurity - worry: Not on file  . Food insecurity - inability: Not on file  . Transportation needs - medical: Not on file  . Transportation needs - non-medical: Not on file  Occupational History  . 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 Smoking status: Never Smoker  . Smokeless tobacco: Never Used  Substance and Sexual Activity  . Alcohol use: Yes    Alcohol/week: 0.0 oz    Comment: 2 ounce red wine 3-4 wks  . Drug use: No  . Sexual activity: Not on file  Other Topics Concern  . Not on file  Social History Narrative   Patient is married [name]) and lives at home with her husband.   Patient has two children.   Patient is retired.   Patient has a Master's degree.   Patient drinks two cups of coffee daily.   Patient is right-handed.           Physical Exam: BP 100/60 (BP Location: Left Arm, Patient Position: Sitting, Cuff Size: Normal)   Pulse 84   Ht 5\' 5"  (1.651 m) Comment: height measured without shoes  Wt 127 lb 2 oz (57.7 kg)   BMI 21.15 kg/m  Constitutional: generally well-appearing Psychiatric: alert and oriented x3 Abdomen: soft, nontender, nondistended, no obvious ascites, no peritoneal signs, normal bowel sounds No peripheral edema noted in lower extremities  Assessment and plan: 77 y.o. female with family history of colon cancer, intermittent  pyrosis  First her intermittent pyrosis is usually relieved with either eating crackers or a 2-week dose of over-the-counter strength proton pump inhibitor.  She has no alarm symptoms.  I do not think she needs any evaluation of this further.  Her family history of colon cancer is not significant enough to require every 4-year colon cancer screenings.  We will put her in a reminder system for repeat colon cancer screening, colonoscopy discussion in November 2023 which would be 10 years from her last screening.  Please see the "Patient Instructions"  section for addition details about the plan.  Rob Bunting, MD [facility] 05/30/2017, 10:55 AM

## 2017-05-30 NOTE — Patient Instructions (Addendum)
Recall colonoscopy 03/2022 for routine risk colon cancer screening.  Normal BMI (Body Mass Index- based on height and weight) is between 23 and 30. Your BMI today is Body mass index is 21.15 kg/m. Marland Kitchen. Please consider follow up  regarding your BMI with your Primary Care Provider.

## 2017-06-30 ENCOUNTER — Other Ambulatory Visit (HOSPITAL_COMMUNITY): Payer: Self-pay | Admitting: *Deleted

## 2017-06-30 NOTE — Progress Notes (Signed)
MEDICAL CLEARANCE NOTE DR Merri BrunetteANDACE SMITH 05-20-17 ON CHART FOR 07-06-16 SURGERY.

## 2017-06-30 NOTE — Patient Instructions (Addendum)
Cheryl LambertMary Ellen [name]  06/30/2017   Your procedure is scheduled on: Wednesday 07-06-17  [facility]  Report to admitting at 630  AM  Call this number if you have problems the morning of surgery [phone]   Remember: Do not eat food or drink liquids :After Midnight.     Take these medicines the morning of surgery with A SIP OF WATER: SYNTHROID, CRESTOR                                 You may not have any metal on your body including hair pins and              piercings  Do not wear jewelry, make-up, lotions, powders or perfumes, deodorant             Do not wear nail polish.  Do not shave  48 hours prior to surgery.             Do not bring valuables to the hospital. [facility] IS NOT             RESPONSIBLE   FOR VALUABLES.  Contacts, dentures or bridgework may not be worn into surgery.  Leave suitcase in the car. After surgery it may be brought to your room.               Please read over the following fact sheets you were given: _____________________________________________________________________             [facility] - Preparing for [facility] Before surgery, you can play an important role.  Because skin is not sterile, your skin needs to be as free of germs as possible.  You can reduce the number of germs on your skin by washing with CHG (chlorahexidine gluconate) soap before surgery.  CHG is an antiseptic cleaner which kills germs and bonds with the skin to continue killing germs even after washing. Please DO NOT use if you have an allergy to CHG or antibacterial soaps.  If your skin becomes reddened/irritated stop using the CHG and inform your nurse when you arrive at Short Stay. Do not shave (including legs and underarms) for at least 48 hours prior to the first CHG shower.  You may shave your face/neck. Please follow these instructions carefully:  1.  Shower with CHG Soap the night before surgery and the  morning of  Surgery.  2.  If you choose to wash your hair, wash your hair first as usual with your  normal  shampoo.  3.  After you shampoo, rinse your hair and body thoroughly to remove the  shampoo.                           4.  Use CHG as you would any other liquid soap.  You can apply chg directly  to the skin and wash                       Gently with a scrungie or clean washcloth.  5.  Apply the CHG Soap to your body ONLY FROM THE NECK DOWN.   Do not use on face/ open  Wound or open sores. Avoid contact with eyes, ears mouth and genitals (private parts).                       Wash face,  Genitals (private parts) with your normal soap.             6.  Wash thoroughly, paying special attention to the area where your surgery  will be performed.  7.  Thoroughly rinse your body with warm water from the neck down.  8.  DO NOT shower/wash with your normal soap after using and rinsing off  the CHG Soap.                9.  Pat yourself dry with a clean towel.            10.  Wear clean pajamas.            11.  Place clean sheets on your bed the night of your first shower and do not  sleep with pets. Day of Surgery : Do not apply any lotions/deodorants the morning of surgery.  Please wear clean clothes to the hospital/surgery center.  FAILURE TO FOLLOW THESE INSTRUCTIONS MAY RESULT IN THE CANCELLATION OF YOUR SURGERY PATIENT SIGNATURE_________________________________  NURSE SIGNATURE__________________________________  ________________________________________________________________________   Adam Phenix  An incentive spirometer is a tool that can help keep your lungs clear and active. This tool measures how well you are filling your lungs with each breath. Taking long deep breaths may help reverse or decrease the chance of developing breathing (pulmonary) problems (especially infection) following:  A long period of time when you are unable to move or be active. BEFORE  THE PROCEDURE   If the spirometer includes an indicator to show your best effort, your nurse or respiratory therapist will set it to a desired goal.  If possible, sit up straight or lean slightly forward. Try not to slouch.  Hold the incentive spirometer in an upright position. INSTRUCTIONS FOR USE  1. Sit on the edge of your bed if possible, or sit up as far as you can in bed or on a chair. 2. Hold the incentive spirometer in an upright position. 3. Breathe out normally. 4. Place the mouthpiece in your mouth and seal your lips tightly around it. 5. Breathe in slowly and as deeply as possible, raising the piston or the ball toward the top of the column. 6. Hold your breath for 3-5 seconds or for as long as possible. Allow the piston or ball to fall to the bottom of the column. 7. Remove the mouthpiece from your mouth and breathe out normally. 8. Rest for a few seconds and repeat Steps 1 through 7 at least 10 times every 1-2 hours when you are awake. Take your time and take a few normal breaths between deep breaths. 9. The spirometer may include an indicator to show your best effort. Use the indicator as a goal to work toward during each repetition. 10. After each set of 10 deep breaths, practice coughing to be sure your lungs are clear. If you have an incision (the cut made at the time of surgery), support your incision when coughing by placing a pillow or rolled up towels firmly against it. Once you are able to get out of bed, walk around indoors and cough well. You may stop using the incentive spirometer when instructed by your caregiver.  RISKS AND COMPLICATIONS  Take your time so you do not get  dizzy or light-headed.  If you are in pain, you may need to take or ask for pain medication before doing incentive spirometry. It is harder to take a deep breath if you are having pain. AFTER USE  Rest and breathe slowly and easily.  It can be helpful to keep track of a log of your progress.  Your caregiver can provide you with a simple table to help with this. If you are using the spirometer at home, follow these instructions: [place] IF:   You are having difficultly using the spirometer.  You have trouble using the spirometer as often as instructed.  Your pain medication is not giving enough relief while using the spirometer.  You develop fever of 100.5 F (38.1 C) or higher. SEEK IMMEDIATE MEDICAL CARE IF:   You cough up bloody sputum that had not been present before.  You develop fever of 102 F (38.9 C) or greater.  You develop worsening pain at or near the incision site. MAKE SURE YOU:   Understand these instructions.  Will watch your condition.  Will get help right away if you are not doing well or get worse. Document Released: 09/20/2006 Document Revised: 08/02/2011 Document Reviewed: 11/21/2006 ExitCare Patient Information 2014 ExitCare, [place].   ________________________________________________________________________  WHAT IS A BLOOD TRANSFUSION? Blood Transfusion Information  A transfusion is the replacement of blood or some of its parts. Blood is made up of multiple cells which provide different functions.  Red blood cells carry oxygen and are used for blood loss replacement.  White blood cells fight against infection.  Platelets control bleeding.  Plasma helps clot blood.  Other blood products are available for specialized needs, such as hemophilia or other clotting disorders. BEFORE THE TRANSFUSION  Who gives blood for transfusions?   Healthy volunteers who are fully evaluated to make sure their blood is safe. This is blood bank blood. Transfusion therapy is the safest it has ever been in the practice of medicine. Before blood is taken from a donor, a complete history is taken to make sure that person has no history of diseases nor engages in risky social behavior (examples are intravenous drug use or sexual activity with multiple  partners). The donor's travel history is screened to minimize risk of transmitting infections, such as malaria. The donated blood is tested for signs of infectious diseases, such as HIV and hepatitis. The blood is then tested to be sure it is compatible with you in order to minimize the chance of a transfusion reaction. If you or a relative donates blood, this is often done in anticipation of surgery and is not appropriate for emergency situations. It takes many days to process the donated blood. RISKS AND COMPLICATIONS Although transfusion therapy is very safe and saves many lives, the main dangers of transfusion include:   Getting an infectious disease.  Developing a transfusion reaction. This is an allergic reaction to something in the blood you were given. Every precaution is taken to prevent this. The decision to have a blood transfusion has been considered carefully by your caregiver before blood is given. Blood is not given unless the benefits outweigh the risks. AFTER THE TRANSFUSION  Right after receiving a blood transfusion, you will usually feel much better and more energetic. This is especially true if your red blood cells have gotten low (anemic). The transfusion raises the level of the red blood cells which carry oxygen, and this usually causes an energy increase.  The nurse administering the transfusion will  monitor you carefully for complications. HOME CARE INSTRUCTIONS  No special instructions are needed after a transfusion. You may find your energy is better. Speak with your caregiver about any limitations on activity for underlying diseases you may have. SEEK MEDICAL CARE IF:   Your condition is not improving after your transfusion.  You develop redness or irritation at the intravenous (IV) site. SEEK IMMEDIATE MEDICAL CARE IF:  Any of the following symptoms occur over the next 12 hours:  Shaking chills.  You have a temperature by mouth above 102 F (38.9 C), not  controlled by medicine.  Chest, back, or muscle pain.  People around you feel you are not acting correctly or are confused.  Shortness of breath or difficulty breathing.  Dizziness and fainting.  You get a rash or develop hives.  You have a decrease in urine output.  Your urine turns a dark color or changes to pink, red, or brown. Any of the following symptoms occur over the next 10 days:  You have a temperature by mouth above 102 F (38.9 C), not controlled by medicine.  Shortness of breath.  Weakness after normal activity.  The white part of the eye turns yellow (jaundice).  You have a decrease in the amount of urine or are urinating less often.  Your urine turns a dark color or changes to pink, red, or brown. Document Released: 05/07/2000 Document Revised: 08/02/2011 Document Reviewed: 12/25/2007 [facility] Patient Information 2014 [place], [place].  _______________________________________________________________________

## 2017-07-01 ENCOUNTER — Encounter (HOSPITAL_COMMUNITY)
Admission: RE | Admit: 2017-07-01 | Discharge: 2017-07-01 | Disposition: A | Discharge status: Home / Self Care | Payer: Medicare Other | Source: Ambulatory Visit | Attending: Orthopedic Surgery | Admitting: Orthopedic Surgery

## 2017-07-01 ENCOUNTER — Encounter (HOSPITAL_COMMUNITY): Payer: Self-pay

## 2017-07-01 ENCOUNTER — Other Ambulatory Visit: Payer: Self-pay

## 2017-07-01 DIAGNOSIS — M1611 Unilateral primary osteoarthritis, right hip: Secondary | ICD-10-CM | POA: Insufficient documentation

## 2017-07-01 DIAGNOSIS — Z01818 Encounter for other preprocedural examination: Secondary | ICD-10-CM | POA: Diagnosis not present

## 2017-07-01 HISTORY — DX: Family history of other specified conditions: Z84.89

## 2017-07-01 HISTORY — DX: Anemia, unspecified: D64.9

## 2017-07-01 LAB — COMPREHENSIVE METABOLIC PANEL
ALT: 17 U/L (ref 14–54)
ANION GAP: 6 (ref 5–15)
AST: 24 U/L (ref 15–41)
Albumin: 4.2 g/dL (ref 3.5–5.0)
Alkaline Phosphatase: 49 U/L (ref 38–126)
BILIRUBIN TOTAL: 0.9 mg/dL (ref 0.3–1.2)
BUN: 16 mg/dL (ref 6–20)
CO2: 26 mmol/L (ref 22–32)
Calcium: 8.8 mg/dL — ABNORMAL LOW (ref 8.9–10.3)
Chloride: 105 mmol/L (ref 101–111)
Creatinine, Ser: 0.64 mg/dL (ref 0.44–1.00)
GFR calc Af Amer: >60 mL/min (ref >60)
GFR calc non Af Amer: >60 mL/min (ref >60)
Glucose, Bld: 101 mg/dL — ABNORMAL HIGH (ref 65–99)
POTASSIUM: 4.2 mmol/L (ref 3.5–5.1)
Sodium: 137 mmol/L (ref 135–145)
TOTAL PROTEIN: 6.5 g/dL (ref 6.5–8.1)

## 2017-07-01 LAB — PROTIME-INR
INR: 1.04
Prothrombin Time: 13.5 seconds (ref 11.4–15.2)

## 2017-07-01 LAB — CBC
HEMATOCRIT: 40.8 % (ref 36.0–46.0)
Hemoglobin: 13.3 g/dL (ref 12.0–15.0)
MCH: 32.7 pg (ref 26.0–34.0)
MCHC: 32.6 g/dL (ref 30.0–36.0)
MCV: 100.2 fL — AB (ref 78.0–100.0)
Platelets: 138 10*3/uL — ABNORMAL LOW (ref 150–400)
RBC: 4.07 MIL/uL (ref 3.87–5.11)
RDW: 13.5 % (ref 11.5–15.5)
WBC: 4.8 10*3/uL (ref 4.0–10.5)

## 2017-07-01 LAB — SURGICAL PCR SCREEN
MRSA, PCR: NEGATIVE
STAPHYLOCOCCUS AUREUS: NEGATIVE

## 2017-07-01 LAB — APTT: aPTT: 32 seconds (ref 24–36)

## 2017-07-01 LAB — ABO/RH: ABO/RH(D): A POS

## 2017-07-03 ENCOUNTER — Ambulatory Visit: Payer: Self-pay | Admitting: Orthopedic Surgery

## 2017-07-03 NOTE — H&P (Signed)
[name], [name] [phone], F) DOB May 01, 1941   Chief Complaint Right hip pain H&P right THA 07/06/17  Patient's Care Team Primary Care Provider: Dario Guardian MD: [address] [place], [place], [place] [address], Ph [phone], Fax [phone] NPI: [id number] Patient's Pharmacies [id number] [facility]): [address] [place] [address], Ph [phone], Fax [phone]   Vitals 06/21/2017 02:06 pm Ht: 5 ft 5 in  Wt: 128 lbs  BMI: 21.3 Pain Scale: 1 Pain Scale Type: Numeric BP - 132/68 Pulse - 76  Allergies Reviewed Allergies NKDA   Medications Reviewed Medications Climara 0.1 mg/24 hr transdermal patch 06/21/17   entered [employer] Crestor 5 mg tablet 06/21/17   entered [name] Plaquenil 06/21/17   entered [name] proGESTerone micronized 100 mg capsule 06/21/17   entered [name] sulfaSALAzine 06/21/17   entered Kieth Brightly Synthroid 50 mcg tablet 06/21/17   entered [name] testosterone propionate 2 % transdermal cream 06/21/17   entered Kieth Brightly            Problems Reviewed Problems Osteoarthritis of right hip joint - Onset: 06/21/2017   Family History Reviewed Family History Father - Heart disease   - Hypertensive disorder   Social History Reviewed Social History Smoking Status: Never smoker Chewing tobacco: none Alcohol intake: Occasional Hand Dominance: Right Work related injury?: N Advance directive: N Medical Power of Attorney: Y   Surgical History Reviewed Surgical History Rotator Cuff Surgery - 05/25/2015 Cataract - 05/24/2012 Colonoscopy - 05/24/2008 Hand Surgery - 05/24/2004 LASIK - 05/24/1998 Cholecystectomy - 05/24/1989 Appendectomy - 05/24/1976 Hysterectomy - 05/24/1976 Tonsillectomy - 05/25/1947   Past Medical History Reviewed Past Medical History Joint Pain: Y Osteoarthritis: Y Previous Cortisone Injection(s): Y Previous Oral Steroid(s): Y Psoriasis:  Y    HPI Patient is a 77 year old female scheduled for a right total hip - anterior approach by Dr. Lequita Halt on 07/06/2017 at [facility]. The patient is a 77 year old female who has been seen for their hip. The patient is being followed for their right hip pain and osteoarthritis. Symptoms reported include: pain (intermittent still) and pain at night. The patient reports their pain level to be mild to moderate. The following medication has been used for pain control: antiinflammatory medication (ibuprofen 800mg , prn. She is also on Plaquenil and Sulfasalazine per rheumatologist.). She has been able to manage her flare ups with OTC meds and she was also placed on Plaquenil and Sulfasalazine per her rheumatologist which has helped her overall pain and discomfort for her autoimmune condition. She does not have a lot of pain with walking, it more with transferring to a standing position after sitting and night pain and discomfort when moving around in the bed. She tries to remain active and exercises about 4 days a week. Radiographs-AP pelvis and lateral of the RIGHT hip show that she is nearly completely bone-on-bone at this time. She has subchondral cysts and osteophyte formation. The arthritis has progressed since her radiographs 6 months ago. She has advanced arthritic change of the RIGHT hip with progressively worsening pain and dysfunction. The dysfunction is the main thing is getting her now. She says the pain is bad but she can tolerate that. She really wants to be more active. At this point the most predictable means of getting her better would be total hip arthroplasty. We discussed the procedure risks and potential complication and rehab course in detail.   ROS Constitutional: Constitutional: no fever, chills, night sweats,  or significant weight loss.  Cardiovascular: Cardiovascular: no palpitations or chest pain.  Respiratory: Respiratory: no cough or shortness of breath and No  COPD.  Gastrointestinal: Gastrointestinal: no vomiting or nausea.  Musculoskeletal: Musculoskeletal: no swelling in Joints and Joint Pain.  Neurologic: Neurologic: no numbness, tingling, or difficulty with balance.    Physical Exam Patient is a 77 year old female.  General Mental Status - Alert, cooperative and good historian. General Appearance - pleasant, Not in acute distress. Orientation - Oriented X3. Build & Nutrition - Well nourished and Well developed.  Head and Neck Head - normocephalic, atraumatic . Neck Global Assessment - supple, no bruit auscultated on the right, no bruit auscultated on the left.  Eye Pupil - Bilateral - PERR Motion - Bilateral - EOMI.  Chest and Lung Exam Auscultation Breath sounds - clear at anterior chest wall and clear at posterior chest wall. Adventitious sounds - No Adventitious sounds.  Cardiovascular Auscultation Rhythm - Regular rate and rhythm. Heart Sounds - S1 WNL and S2 WNL. Murmurs & Other Heart Sounds - Auscultation of the heart reveals - No Murmurs.  Abdomen Palpation/Percussion Tenderness - Abdomen is non-tender to palpation. Abdomen is soft. Auscultation Auscultation of the abdomen reveals - Bowel sounds normal.  Female Genitourinary Note: Not done, not pertinent to present illness  Musculoskeletal Her RIGHT hip can be flexed to 100 with minimal internal rotation about 10 of external rotation and 20 of abduction. Her LEFT hip has normal range of motion without discomfort. She has an antalgic gait pattern on the RIGHT. Her knee exam is unremarkable on the RIGHT.  Radiographs-AP pelvis and lateral of the RIGHT hip show that she is nearly completely bone-on-bone at this time. She has subchondral cysts and osteophyte formation.    Assessment / Plan 1. Osteoarthritis of right hip joint M16.11: Unilateral primary osteoarthritis, right hip  Patient Instructions Surgical Plans: Right Total Hip Replacement - Anterior  Approach Disposition: Home with HHPT PCP: Dr. Erby Pian. Smith IV TXA Anesthesia Issues: None Patient was instructed on what medications to stop prior to surgery. - Follow up visit in 2 weeks with Dr. Lequita HaltAluisio - Begin physical therapy following surgery - Pre-operative lab work as pre Pre-Surgical Testing - Prescriptions will be provided in hospital at time of discharge  Return to Office Ollen GrossFrank Aluisio, MD for Post[facility]Op at [facility] on 07/19/2017 at 03:30 PM  Encounter signed-off by Patrica DuelALEXZANDREW Nickalos Petersen, PA-C

## 2017-07-03 NOTE — H&P (View-Only) (Signed)
[name], [name] [phone], F) DOB May 01, 1941   Chief Complaint Right hip pain H&P right THA 07/06/17  Patient's Care Team Primary Care Provider: Dario Guardian MD: [address] [place], [place], [place] [address], Ph [phone], Fax [phone] NPI: [id number] Patient's Pharmacies [id number] [facility]): [address] [place] [address], Ph [phone], Fax [phone]   Vitals 06/21/2017 02:06 pm Ht: 5 ft 5 in  Wt: 128 lbs  BMI: 21.3 Pain Scale: 1 Pain Scale Type: Numeric BP - 132/68 Pulse - 76  Allergies Reviewed Allergies NKDA   Medications Reviewed Medications Climara 0.1 mg/24 hr transdermal patch 06/21/17   entered [employer] Crestor 5 mg tablet 06/21/17   entered [name] Plaquenil 06/21/17   entered [name] proGESTerone micronized 100 mg capsule 06/21/17   entered [name] sulfaSALAzine 06/21/17   entered Kieth Brightly Synthroid 50 mcg tablet 06/21/17   entered [name] testosterone propionate 2 % transdermal cream 06/21/17   entered Kieth Brightly            Problems Reviewed Problems Osteoarthritis of right hip joint - Onset: 06/21/2017   Family History Reviewed Family History Father - Heart disease   - Hypertensive disorder   Social History Reviewed Social History Smoking Status: Never smoker Chewing tobacco: none Alcohol intake: Occasional Hand Dominance: Right Work related injury?: N Advance directive: N Medical Power of Attorney: Y   Surgical History Reviewed Surgical History Rotator Cuff Surgery - 05/25/2015 Cataract - 05/24/2012 Colonoscopy - 05/24/2008 Hand Surgery - 05/24/2004 LASIK - 05/24/1998 Cholecystectomy - 05/24/1989 Appendectomy - 05/24/1976 Hysterectomy - 05/24/1976 Tonsillectomy - 05/25/1947   Past Medical History Reviewed Past Medical History Joint Pain: Y Osteoarthritis: Y Previous Cortisone Injection(s): Y Previous Oral Steroid(s): Y Psoriasis:  Y    HPI Patient is a 77 year old female scheduled for a right total hip - anterior approach by Dr. Lequita Halt on 07/06/2017 at [facility]. The patient is a 77 year old female who has been seen for their hip. The patient is being followed for their right hip pain and osteoarthritis. Symptoms reported include: pain (intermittent still) and pain at night. The patient reports their pain level to be mild to moderate. The following medication has been used for pain control: antiinflammatory medication (ibuprofen 800mg , prn. She is also on Plaquenil and Sulfasalazine per rheumatologist.). She has been able to manage her flare ups with OTC meds and she was also placed on Plaquenil and Sulfasalazine per her rheumatologist which has helped her overall pain and discomfort for her autoimmune condition. She does not have a lot of pain with walking, it more with transferring to a standing position after sitting and night pain and discomfort when moving around in the bed. She tries to remain active and exercises about 4 days a week. Radiographs-AP pelvis and lateral of the RIGHT hip show that she is nearly completely bone-on-bone at this time. She has subchondral cysts and osteophyte formation. The arthritis has progressed since her radiographs 6 months ago. She has advanced arthritic change of the RIGHT hip with progressively worsening pain and dysfunction. The dysfunction is the main thing is getting her now. She says the pain is bad but she can tolerate that. She really wants to be more active. At this point the most predictable means of getting her better would be total hip arthroplasty. We discussed the procedure risks and potential complication and rehab course in detail.   ROS Constitutional: Constitutional: no fever, chills, night sweats,  or significant weight loss.  Cardiovascular: Cardiovascular: no palpitations or chest pain.  Respiratory: Respiratory: no cough or shortness of breath and No  COPD.  Gastrointestinal: Gastrointestinal: no vomiting or nausea.  Musculoskeletal: Musculoskeletal: no swelling in Joints and Joint Pain.  Neurologic: Neurologic: no numbness, tingling, or difficulty with balance.    Physical Exam Patient is a 77 year old female.  General Mental Status - Alert, cooperative and good historian. General Appearance - pleasant, Not in acute distress. Orientation - Oriented X3. Build & Nutrition - Well nourished and Well developed.  Head and Neck Head - normocephalic, atraumatic . Neck Global Assessment - supple, no bruit auscultated on the right, no bruit auscultated on the left.  Eye Pupil - Bilateral - PERR Motion - Bilateral - EOMI.  Chest and Lung Exam Auscultation Breath sounds - clear at anterior chest wall and clear at posterior chest wall. Adventitious sounds - No Adventitious sounds.  Cardiovascular Auscultation Rhythm - Regular rate and rhythm. Heart Sounds - S1 WNL and S2 WNL. Murmurs & Other Heart Sounds - Auscultation of the heart reveals - No Murmurs.  Abdomen Palpation/Percussion Tenderness - Abdomen is non-tender to palpation. Abdomen is soft. Auscultation Auscultation of the abdomen reveals - Bowel sounds normal.  Female Genitourinary Note: Not done, not pertinent to present illness  Musculoskeletal Her RIGHT hip can be flexed to 100 with minimal internal rotation about 10 of external rotation and 20 of abduction. Her LEFT hip has normal range of motion without discomfort. She has an antalgic gait pattern on the RIGHT. Her knee exam is unremarkable on the RIGHT.  Radiographs-AP pelvis and lateral of the RIGHT hip show that she is nearly completely bone-on-bone at this time. She has subchondral cysts and osteophyte formation.    Assessment / Plan 1. Osteoarthritis of right hip joint M16.11: Unilateral primary osteoarthritis, right hip  Patient Instructions Surgical Plans: Right Total Hip Replacement - Anterior  Approach Disposition: Home with HHPT PCP: Dr. Erby Pian. Smith IV TXA Anesthesia Issues: None Patient was instructed on what medications to stop prior to surgery. - Follow up visit in 2 weeks with Dr. Lequita HaltAluisio - Begin physical therapy following surgery - Pre-operative lab work as pre Pre-Surgical Testing - Prescriptions will be provided in hospital at time of discharge  Return to Office Ollen GrossFrank Aluisio, MD for [facility] on 07/19/2017 at 03:30 PM  Encounter signed-off by Patrica DuelALEXZANDREW PERKINS, PA-C

## 2017-07-04 NOTE — Progress Notes (Signed)
EKG 05-20-17 [name] ON CHART

## 2017-07-05 ENCOUNTER — Encounter (HOSPITAL_COMMUNITY): Payer: Self-pay | Admitting: Anesthesiology

## 2017-07-05 MED ORDER — BUPIVACAINE LIPOSOME 1.3 % IJ SUSP
20.0000 mL | Freq: Once | INTRAMUSCULAR | Status: DC
  Filled 2017-07-05: qty 20

## 2017-07-05 MED ORDER — TRANEXAMIC ACID 1000 MG/10ML IV SOLN
1000.0000 mg | INTRAVENOUS | Status: AC
  Administered 2017-07-06: 1000 mg via INTRAVENOUS
  Filled 2017-07-05: qty 1100

## 2017-07-05 NOTE — Anesthesia Preprocedure Evaluation (Addendum)
Anesthesia Evaluation  Patient identified by MRN, date of birth, ID band Patient awake    Reviewed: Allergy & Precautions, NPO status , Patient's Chart, lab work & pertinent test results  History of Anesthesia Complications (+) PONV, Family history of anesthesia reaction and history of anesthetic complications  Airway Mallampati: II  TM Distance: >3 FB Neck ROM: Full    Dental no notable dental hx. (+) Teeth Intact   Pulmonary pneumonia, resolved,    Pulmonary exam normal breath sounds clear to auscultation       Cardiovascular Normal cardiovascular exam+ dysrhythmias  Rhythm:Regular Rate:Normal     Neuro/Psych  Headaches, Mild cognitive impairment Memory impairment  Neuromuscular disease negative psych ROS   GI/Hepatic GERD  Controlled and Medicated,  Endo/Other  Hypothyroidism   Renal/GU Renal diseaseHx/o chronic uti  negative genitourinary   Musculoskeletal  (+) Arthritis , Osteoarthritis,  Fibromyalgia -OA Right hip Hx/ Psoriatic arthritis   Abdominal   Peds  Hematology  (+) anemia ,   Anesthesia Other Findings   Reproductive/Obstetrics                            Anesthesia Physical Anesthesia Plan  ASA: III  Anesthesia Plan: Spinal   Post-op Pain Management:    Induction:   PONV Risk Score and Plan: Propofol infusion and Treatment may vary due to age or medical condition  Airway Management Planned: Natural Airway and Simple Face Mask  Additional Equipment:   Intra-op Plan:   Post-operative Plan:   Informed Consent: I have reviewed the patients History and Physical, chart, labs and discussed the procedure including the risks, benefits and alternatives for the proposed anesthesia with the patient or authorized representative who has indicated his/her understanding and acceptance.     Plan Discussed with: CRNA, Anesthesiologist and Surgeon  Anesthesia Plan Comments:  (No versed. Propofol only for sedation.)       Anesthesia Quick Evaluation

## 2017-07-06 ENCOUNTER — Encounter (HOSPITAL_COMMUNITY)
Admission: RE | Disposition: A | Discharge status: Home Health | Payer: Self-pay | Source: Ambulatory Visit | Attending: Orthopedic Surgery

## 2017-07-06 ENCOUNTER — Inpatient Hospital Stay (HOSPITAL_COMMUNITY): Payer: Medicare Other

## 2017-07-06 ENCOUNTER — Inpatient Hospital Stay (HOSPITAL_COMMUNITY)
Admission: RE | Admit: 2017-07-06 | Discharge: 2017-07-07 | DRG: 470 | Disposition: A | Discharge status: Home Health | Payer: Medicare Other | Source: Ambulatory Visit | Attending: Orthopedic Surgery | Admitting: Orthopedic Surgery

## 2017-07-06 ENCOUNTER — Inpatient Hospital Stay (HOSPITAL_COMMUNITY): Payer: Medicare Other | Admitting: Anesthesiology

## 2017-07-06 ENCOUNTER — Other Ambulatory Visit: Payer: Self-pay

## 2017-07-06 ENCOUNTER — Encounter (HOSPITAL_COMMUNITY): Payer: Self-pay | Admitting: Certified Registered Nurse Anesthetist

## 2017-07-06 DIAGNOSIS — L409 Psoriasis, unspecified: Secondary | ICD-10-CM | POA: Diagnosis present

## 2017-07-06 DIAGNOSIS — G43909 Migraine, unspecified, not intractable, without status migrainosus: Secondary | ICD-10-CM | POA: Diagnosis present

## 2017-07-06 DIAGNOSIS — E785 Hyperlipidemia, unspecified: Secondary | ICD-10-CM | POA: Diagnosis present

## 2017-07-06 DIAGNOSIS — Z90710 Acquired absence of both cervix and uterus: Secondary | ICD-10-CM

## 2017-07-06 DIAGNOSIS — Z8249 Family history of ischemic heart disease and other diseases of the circulatory system: Secondary | ICD-10-CM

## 2017-07-06 DIAGNOSIS — E039 Hypothyroidism, unspecified: Secondary | ICD-10-CM | POA: Diagnosis present

## 2017-07-06 DIAGNOSIS — M169 Osteoarthritis of hip, unspecified: Secondary | ICD-10-CM | POA: Diagnosis present

## 2017-07-06 DIAGNOSIS — M797 Fibromyalgia: Secondary | ICD-10-CM | POA: Diagnosis present

## 2017-07-06 DIAGNOSIS — Z86010 Personal history of colonic polyps: Secondary | ICD-10-CM

## 2017-07-06 DIAGNOSIS — Z79890 Hormone replacement therapy (postmenopausal): Secondary | ICD-10-CM

## 2017-07-06 DIAGNOSIS — M1611 Unilateral primary osteoarthritis, right hip: Secondary | ICD-10-CM | POA: Diagnosis present

## 2017-07-06 DIAGNOSIS — Z9049 Acquired absence of other specified parts of digestive tract: Secondary | ICD-10-CM

## 2017-07-06 DIAGNOSIS — K219 Gastro-esophageal reflux disease without esophagitis: Secondary | ICD-10-CM | POA: Diagnosis present

## 2017-07-06 DIAGNOSIS — M25551 Pain in right hip: Secondary | ICD-10-CM | POA: Diagnosis present

## 2017-07-06 DIAGNOSIS — L4050 Arthropathic psoriasis, unspecified: Secondary | ICD-10-CM | POA: Diagnosis present

## 2017-07-06 DIAGNOSIS — Z79899 Other long term (current) drug therapy: Secondary | ICD-10-CM

## 2017-07-06 DIAGNOSIS — Z96649 Presence of unspecified artificial hip joint: Secondary | ICD-10-CM

## 2017-07-06 HISTORY — PX: TOTAL HIP ARTHROPLASTY: SHX124

## 2017-07-06 LAB — TYPE AND SCREEN
ABO/RH(D): A POS
ABO/RH(D): A POS
ANTIBODY SCREEN: NEGATIVE
ANTIBODY SCREEN: NEGATIVE

## 2017-07-06 SURGERY — ARTHROPLASTY, HIP, TOTAL, ANTERIOR APPROACH
Anesthesia: Spinal | Site: Hip | Laterality: Right

## 2017-07-06 MED ORDER — 0.9 % SODIUM CHLORIDE (POUR BTL) OPTIME
TOPICAL | Status: DC | PRN
  Administered 2017-07-06: 1000 mL

## 2017-07-06 MED ORDER — METOCLOPRAMIDE HCL 5 MG/ML IJ SOLN: 10.0000 mg | Freq: Once | INTRAMUSCULAR | Status: DC | PRN

## 2017-07-06 MED ORDER — VANCOMYCIN HCL IN DEXTROSE 1-5 GM/200ML-% IV SOLN
1000.0000 mg | Freq: Two times a day (BID) | INTRAVENOUS | Status: AC
  Administered 2017-07-06: 21:00:00 1000 mg via INTRAVENOUS
  Filled 2017-07-06: qty 200

## 2017-07-06 MED ORDER — CHLORHEXIDINE GLUCONATE 4 % EX LIQD: 60.0000 mL | Freq: Once | CUTANEOUS | Status: DC

## 2017-07-06 MED ORDER — ACETAMINOPHEN 500 MG PO TABS
1000.0000 mg | ORAL_TABLET | Freq: Four times a day (QID) | ORAL | Status: AC
  Administered 2017-07-06 – 2017-07-07 (×4): 1000 mg via ORAL
  Filled 2017-07-06 (×4): qty 2

## 2017-07-06 MED ORDER — METHOCARBAMOL 500 MG PO TABS
500.0000 mg | ORAL_TABLET | Freq: Four times a day (QID) | ORAL | Status: DC | PRN
  Administered 2017-07-07: 01:00:00 500 mg via ORAL
  Filled 2017-07-06: qty 1

## 2017-07-06 MED ORDER — ONDANSETRON HCL 4 MG/2ML IJ SOLN
INTRAMUSCULAR | Status: DC | PRN
  Administered 2017-07-06: 4 mg via INTRAVENOUS

## 2017-07-06 MED ORDER — BISACODYL 10 MG RE SUPP: 10.0000 mg | Freq: Every day | RECTAL | Status: DC | PRN

## 2017-07-06 MED ORDER — DEXAMETHASONE SODIUM PHOSPHATE 10 MG/ML IJ SOLN
10.0000 mg | Freq: Once | INTRAMUSCULAR | Status: AC
  Administered 2017-07-06: 10 mg via INTRAVENOUS

## 2017-07-06 MED ORDER — MAGNESIUM OXIDE 250 MG PO TABS: 500.0000 mg | ORAL_TABLET | Freq: Every day | ORAL | Status: DC

## 2017-07-06 MED ORDER — TESTOSTERONE PROPIONATE POWD: 0.5000 mL | Status: DC

## 2017-07-06 MED ORDER — ONDANSETRON HCL 4 MG/2ML IJ SOLN: 4.0000 mg | Freq: Four times a day (QID) | INTRAMUSCULAR | Status: DC | PRN

## 2017-07-06 MED ORDER — GABAPENTIN 100 MG PO CAPS
100.0000 mg | ORAL_CAPSULE | Freq: Every day | ORAL | Status: DC
  Administered 2017-07-06: 21:00:00 100 mg via ORAL
  Filled 2017-07-06: qty 1

## 2017-07-06 MED ORDER — OXYCODONE HCL 5 MG PO TABS
5.0000 mg | ORAL_TABLET | ORAL | Status: DC | PRN
  Filled 2017-07-06: qty 1

## 2017-07-06 MED ORDER — MAGNESIUM OXIDE 400 (241.3 MG) MG PO TABS
200.0000 mg | ORAL_TABLET | Freq: Every day | ORAL | Status: DC
  Administered 2017-07-07: 10:00:00 200 mg via ORAL
  Filled 2017-07-06: qty 1

## 2017-07-06 MED ORDER — BUPIVACAINE-EPINEPHRINE 0.25% -1:200000 IJ SOLN
INTRAMUSCULAR | Status: AC
  Filled 2017-07-06: qty 1

## 2017-07-06 MED ORDER — PHENYLEPHRINE HCL 10 MG/ML IJ SOLN
INTRAMUSCULAR | Status: DC | PRN
  Administered 2017-07-06: 80 ug via INTRAVENOUS

## 2017-07-06 MED ORDER — SODIUM CHLORIDE 0.9 % IV SOLN
INTRAVENOUS | Status: DC
  Administered 2017-07-06: 12:00:00 via INTRAVENOUS

## 2017-07-06 MED ORDER — ESTRADIOL 0.1 MG/24HR TD PTWK: 0.1000 mg | MEDICATED_PATCH | TRANSDERMAL | Status: DC

## 2017-07-06 MED ORDER — LACTATED RINGERS IV SOLN
INTRAVENOUS | Status: DC
  Administered 2017-07-06 (×2): via INTRAVENOUS

## 2017-07-06 MED ORDER — ONDANSETRON HCL 4 MG PO TABS: 4.0000 mg | ORAL_TABLET | Freq: Four times a day (QID) | ORAL | Status: DC | PRN

## 2017-07-06 MED ORDER — PROPOFOL 500 MG/50ML IV EMUL
INTRAVENOUS | Status: DC | PRN
  Administered 2017-07-06: 30 mg via INTRAVENOUS

## 2017-07-06 MED ORDER — FENTANYL CITRATE (PF) 100 MCG/2ML IJ SOLN
INTRAMUSCULAR | Status: AC
Start: 2017-07-06 — End: 2017-07-06
  Filled 2017-07-06: qty 2

## 2017-07-06 MED ORDER — LEVOTHYROXINE SODIUM 50 MCG PO TABS
50.0000 ug | ORAL_TABLET | Freq: Every day | ORAL | Status: DC
  Administered 2017-07-07: 08:00:00 50 ug via ORAL
  Filled 2017-07-06: qty 1

## 2017-07-06 MED ORDER — RIVAROXABAN 10 MG PO TABS
10.0000 mg | ORAL_TABLET | Freq: Every day | ORAL | Status: DC
  Administered 2017-07-07: 10 mg via ORAL
  Filled 2017-07-06: qty 1

## 2017-07-06 MED ORDER — METOCLOPRAMIDE HCL 5 MG PO TABS: 5.0000 mg | ORAL_TABLET | Freq: Three times a day (TID) | ORAL | Status: DC | PRN

## 2017-07-06 MED ORDER — VANCOMYCIN HCL IN DEXTROSE 1-5 GM/200ML-% IV SOLN
1000.0000 mg | INTRAVENOUS | Status: AC
  Administered 2017-07-06: 1000 mg via INTRAVENOUS
  Filled 2017-07-06: qty 200

## 2017-07-06 MED ORDER — ACETAMINOPHEN 10 MG/ML IV SOLN
1000.0000 mg | Freq: Once | INTRAVENOUS | Status: AC
  Administered 2017-07-06: 1000 mg via INTRAVENOUS
  Filled 2017-07-06: qty 100

## 2017-07-06 MED ORDER — PHENOL 1.4 % MT LIQD: 1.0000 | OROMUCOSAL | Status: DC | PRN

## 2017-07-06 MED ORDER — GABAPENTIN 300 MG PO CAPS
300.0000 mg | ORAL_CAPSULE | Freq: Once | ORAL | Status: AC
  Administered 2017-07-06: 300 mg via ORAL
  Filled 2017-07-06: qty 1

## 2017-07-06 MED ORDER — TRANEXAMIC ACID 1000 MG/10ML IV SOLN
1000.0000 mg | Freq: Once | INTRAVENOUS | Status: AC
  Administered 2017-07-06: 1000 mg via INTRAVENOUS
  Filled 2017-07-06: qty 1100

## 2017-07-06 MED ORDER — PROGESTERONE MICRONIZED 100 MG PO CAPS
100.0000 mg | ORAL_CAPSULE | Freq: Every day | ORAL | Status: DC
  Administered 2017-07-06: 21:00:00 100 mg via ORAL
  Filled 2017-07-06: qty 1

## 2017-07-06 MED ORDER — FENTANYL CITRATE (PF) 100 MCG/2ML IJ SOLN
25.0000 ug | INTRAMUSCULAR | Status: DC | PRN
  Administered 2017-07-06: 50 ug via INTRAVENOUS

## 2017-07-06 MED ORDER — BUPIVACAINE-EPINEPHRINE (PF) 0.25% -1:200000 IJ SOLN
INTRAMUSCULAR | Status: DC | PRN
  Administered 2017-07-06: 30 mL

## 2017-07-06 MED ORDER — DEXAMETHASONE SODIUM PHOSPHATE 10 MG/ML IJ SOLN
10.0000 mg | Freq: Once | INTRAMUSCULAR | Status: AC
  Administered 2017-07-07: 10:00:00 10 mg via INTRAVENOUS
  Filled 2017-07-06: qty 1

## 2017-07-06 MED ORDER — PROPOFOL 500 MG/50ML IV EMUL
INTRAVENOUS | Status: DC | PRN
  Administered 2017-07-06: 75 ug/kg/min via INTRAVENOUS

## 2017-07-06 MED ORDER — TRAMADOL HCL 50 MG PO TABS
50.0000 mg | ORAL_TABLET | Freq: Four times a day (QID) | ORAL | Status: DC | PRN
  Administered 2017-07-06: 12:00:00 50 mg via ORAL
  Administered 2017-07-06: 21:00:00 100 mg via ORAL
  Administered 2017-07-06 – 2017-07-07 (×2): 50 mg via ORAL
  Administered 2017-07-07: 100 mg via ORAL
  Filled 2017-07-06 (×2): qty 1
  Filled 2017-07-06: qty 2
  Filled 2017-07-06: qty 1
  Filled 2017-07-06: qty 2

## 2017-07-06 MED ORDER — ACETAMINOPHEN 325 MG PO TABS: 650.0000 mg | ORAL_TABLET | ORAL | Status: DC | PRN

## 2017-07-06 MED ORDER — FENTANYL CITRATE (PF) 100 MCG/2ML IJ SOLN
INTRAMUSCULAR | Status: AC
  Filled 2017-07-06: qty 2

## 2017-07-06 MED ORDER — PROPOFOL 10 MG/ML IV BOLUS
INTRAVENOUS | Status: AC
  Filled 2017-07-06: qty 60

## 2017-07-06 MED ORDER — ACETAMINOPHEN 650 MG RE SUPP: 650.0000 mg | RECTAL | Status: DC | PRN

## 2017-07-06 MED ORDER — OXYCODONE HCL 5 MG PO TABS: 10.0000 mg | ORAL_TABLET | ORAL | Status: DC | PRN

## 2017-07-06 MED ORDER — TESTOSTERONE 50 MG/5GM (1%) TD GEL: 2.5000 g | TRANSDERMAL | Status: DC

## 2017-07-06 MED ORDER — METHOCARBAMOL 1000 MG/10ML IJ SOLN
500.0000 mg | Freq: Four times a day (QID) | INTRAMUSCULAR | Status: DC | PRN
  Administered 2017-07-06: 500 mg via INTRAVENOUS
  Filled 2017-07-06: qty 550

## 2017-07-06 MED ORDER — BUTALBITAL-APAP-CAFFEINE 50-325-40 MG PO TABS: 1.0000 | ORAL_TABLET | Freq: Four times a day (QID) | ORAL | Status: DC | PRN

## 2017-07-06 MED ORDER — FLEET ENEMA 7-19 GM/118ML RE ENEM: 1.0000 | ENEMA | Freq: Once | RECTAL | Status: DC | PRN

## 2017-07-06 MED ORDER — POLYETHYLENE GLYCOL 3350 17 G PO PACK: 17.0000 g | PACK | Freq: Every day | ORAL | Status: DC | PRN

## 2017-07-06 MED ORDER — DIPHENHYDRAMINE HCL 12.5 MG/5ML PO ELIX: 12.5000 mg | ORAL_SOLUTION | ORAL | Status: DC | PRN

## 2017-07-06 MED ORDER — STERILE WATER FOR IRRIGATION IR SOLN
Status: DC | PRN
  Administered 2017-07-06: 2000 mL

## 2017-07-06 MED ORDER — DOCUSATE SODIUM 100 MG PO CAPS
100.0000 mg | ORAL_CAPSULE | Freq: Two times a day (BID) | ORAL | Status: DC
  Administered 2017-07-06 – 2017-07-07 (×2): 100 mg via ORAL
  Filled 2017-07-06 (×3): qty 1

## 2017-07-06 MED ORDER — MEPERIDINE HCL 50 MG/ML IJ SOLN: 6.2500 mg | INTRAMUSCULAR | Status: DC | PRN

## 2017-07-06 MED ORDER — METOCLOPRAMIDE HCL 5 MG/ML IJ SOLN: 5.0000 mg | Freq: Three times a day (TID) | INTRAMUSCULAR | Status: DC | PRN

## 2017-07-06 MED ORDER — MENTHOL 3 MG MT LOZG: 1.0000 | LOZENGE | OROMUCOSAL | Status: DC | PRN

## 2017-07-06 MED ORDER — ROSUVASTATIN CALCIUM 5 MG PO TABS
5.0000 mg | ORAL_TABLET | Freq: Every day | ORAL | Status: DC
  Administered 2017-07-07: 5 mg via ORAL
  Filled 2017-07-06: qty 1

## 2017-07-06 SURGICAL SUPPLY — 38 items
BAG ZIPLOCK 12X15 (MISCELLANEOUS) ×3 IMPLANT
BLADE SAG 18X100X1.27 (BLADE) ×3 IMPLANT
CAPT HIP TOTAL 2 ×3 IMPLANT
CLOSURE WOUND 1/2 X4 (GAUZE/BANDAGES/DRESSINGS) ×1
CLOTH BEACON ORANGE TIMEOUT ST (SAFETY) ×3 IMPLANT
COVER PERINEAL POST (MISCELLANEOUS) ×3 IMPLANT
COVER SURGICAL LIGHT HANDLE (MISCELLANEOUS) ×3 IMPLANT
DECANTER SPIKE VIAL GLASS SM (MISCELLANEOUS) ×3 IMPLANT
DRAPE STERI IOBAN 125X83 (DRAPES) ×3 IMPLANT
DRAPE U-SHAPE 47X51 STRL (DRAPES) ×6 IMPLANT
DRSG ADAPTIC 3X8 NADH LF (GAUZE/BANDAGES/DRESSINGS) ×3 IMPLANT
DRSG MEPILEX BORDER 4X4 (GAUZE/BANDAGES/DRESSINGS) ×3 IMPLANT
DRSG MEPILEX BORDER 4X8 (GAUZE/BANDAGES/DRESSINGS) ×3 IMPLANT
DURAPREP 26ML APPLICATOR (WOUND CARE) ×3 IMPLANT
ELECT REM PT RETURN 15FT ADLT (MISCELLANEOUS) ×3 IMPLANT
EVACUATOR 1/8 PVC DRAIN (DRAIN) ×3 IMPLANT
GLOVE BIO SURGEON STRL SZ7.5 (GLOVE) ×3 IMPLANT
GLOVE BIO SURGEON STRL SZ8 (GLOVE) ×6 IMPLANT
GLOVE BIOGEL PI IND STRL 7.5 (GLOVE) ×3 IMPLANT
GLOVE BIOGEL PI IND STRL 8 (GLOVE) ×3 IMPLANT
GLOVE BIOGEL PI INDICATOR 7.5 (GLOVE) ×6
GLOVE BIOGEL PI INDICATOR 8 (GLOVE) ×6
GLOVE ECLIPSE 7.5 STRL STRAW (GLOVE) ×3 IMPLANT
GLOVE SURG SS PI 8.0 STRL IVOR (GLOVE) ×3 IMPLANT
GOWN STRL REUS W/ TWL XL LVL3 (GOWN DISPOSABLE) ×1 IMPLANT
GOWN STRL REUS W/TWL LRG LVL3 (GOWN DISPOSABLE) ×6 IMPLANT
GOWN STRL REUS W/TWL XL LVL3 (GOWN DISPOSABLE) ×5 IMPLANT
PACK ANTERIOR HIP CUSTOM (KITS) ×3 IMPLANT
STRIP CLOSURE SKIN 1/2X4 (GAUZE/BANDAGES/DRESSINGS) ×2 IMPLANT
SUT ETHIBOND NAB CT1 #1 30IN (SUTURE) ×3 IMPLANT
SUT MNCRL AB 4-0 PS2 18 (SUTURE) ×3 IMPLANT
SUT STRATAFIX 0 PDS 27 VIOLET (SUTURE) ×3
SUT VIC AB 2-0 CT1 27 (SUTURE) ×4
SUT VIC AB 2-0 CT1 TAPERPNT 27 (SUTURE) ×2 IMPLANT
SUTURE STRATFX 0 PDS 27 VIOLET (SUTURE) ×1 IMPLANT
SYR 50ML LL SCALE MARK (SYRINGE) IMPLANT
TRAY FOLEY CATH SILVER 14FR (SET/KITS/TRAYS/PACK) ×3 IMPLANT
YANKAUER SUCT BULB TIP 10FT TU (MISCELLANEOUS) ×3 IMPLANT

## 2017-07-06 NOTE — Interval H&P Note (Signed)
History and Physical Interval Note:  07/06/2017 7:35 AM  [name]  has presented today for surgery, with the diagnosis of Osteoarthritis Right Hip  The various methods of treatment have been discussed with the patient and family. After consideration of risks, benefits and other options for treatment, the patient has consented to  Procedure(s): RIGHT TOTAL HIP ARTHROPLASTY ANTERIOR APPROACH (Right) as a surgical intervention .  The patient's history has been reviewed, patient examined, no change in status, stable for surgery.  I have reviewed the patient's chart and labs.  Questions were answered to the patient's satisfaction.     Homero FellersFrank Kashari [name]

## 2017-07-06 NOTE — Discharge Instructions (Addendum)
° °Dr. Frank Aluisio °Total Joint Specialist °[facility] °[address] °[place], [place] [address] °[phone] ° °ANTERIOR APPROACH TOTAL HIP REPLACEMENT POSTOPERATIVE DIRECTIONS ° ° °Hip Rehabilitation, Guidelines Following Surgery  °The results of a hip operation are greatly improved after range of motion and muscle strengthening exercises. Follow all safety measures which are given to protect your hip. If any of these exercises cause increased pain or swelling in your joint, decrease the amount until you are comfortable again. Then slowly increase the exercises. Call your caregiver if you have problems or questions.  ° °HOME CARE INSTRUCTIONS  °Remove items at home which could result in a fall. This includes throw rugs or furniture in walking pathways.  °· ICE to the affected hip every three hours for 30 minutes at a time and then as needed for pain and swelling.  Continue to use ice on the hip for pain and swelling from surgery. You may notice swelling that will progress down to the foot and ankle.  This is normal after surgery.  Elevate the leg when you are not up walking on it.   °· Continue to use the breathing machine which will help keep your temperature down.  It is common for your temperature to cycle up and down following surgery, especially at night when you are not up moving around and exerting yourself.  The breathing machine keeps your lungs expanded and your temperature down. ° ° °DIET °You may resume your previous home diet once your are discharged from the hospital. ° °DRESSING / WOUND CARE / SHOWERING °You may shower 3 days after surgery, but keep the wounds dry during showering.  You may use an occlusive plastic wrap (Press'n Seal for example), NO SOAKING/SUBMERGING IN THE BATHTUB.  If the bandage gets wet, change with a clean dry gauze.  If the incision gets wet, pat the wound dry with a clean towel. °You may start showering once you are discharged home but do not submerge  the incision under water. Just pat the incision dry and apply a dry gauze dressing on daily. °Change the surgical dressing daily and reapply a dry dressing each time. ° °ACTIVITY °Walk with your walker as instructed. °Use walker as long as suggested by your caregivers. °Avoid periods of inactivity such as sitting longer than an hour when not asleep. This helps prevent blood clots.  °You may resume a sexual relationship in one month or when given the OK by your doctor.  °You may return to work once you are cleared by your doctor.  °Do not drive a car for 6 weeks or until released by you surgeon.  °Do not drive while taking narcotics. ° °WEIGHT BEARING °Weight bearing as tolerated with assist device (walker, cane, etc) as directed, use it as long as suggested by your surgeon or therapist, typically at least 4-6 weeks. ° °POSTOPERATIVE CONSTIPATION PROTOCOL °Constipation - defined medically as fewer than three stools per week and severe constipation as less than one stool per week. ° °One of the most common issues patients have following surgery is constipation.  Even if you have a regular bowel pattern at home, your normal regimen is likely to be disrupted due to multiple reasons following surgery.  Combination of anesthesia, postoperative narcotics, change in appetite and fluid intake all can affect your bowels.  In order to avoid complications following surgery, here are some recommendations in order to help you during your recovery period. ° °Colace (docusate) - Pick up an over-the-counter   form of Colace or another stool softener and take twice a day as long as you are requiring postoperative pain medications.  Take with a full glass of water daily.  If you experience loose stools or diarrhea, hold the colace until you stool forms back up.  If your symptoms do not get better within 1 week or if they get worse, check with your doctor. ° °Dulcolax (bisacodyl) - Pick up over-the-counter and take as directed by the  product packaging as needed to assist with the movement of your bowels.  Take with a full glass of water.  Use this product as needed if not relieved by Colace only.  ° °MiraLax (polyethylene glycol) - Pick up over-the-counter to have on hand.  MiraLax is a solution that will increase the amount of water in your bowels to assist with bowel movements.  Take as directed and can mix with a glass of water, juice, soda, coffee, or tea.  Take if you go more than two days without a movement. °Do not use MiraLax more than once per day. Call your doctor if you are still constipated or irregular after using this medication for 7 days in a row. ° °If you continue to have problems with postoperative constipation, please contact the office for further assistance and recommendations.  If you experience "the worst abdominal pain ever" or develop nausea or vomiting, please contact the office immediatly for further recommendations for treatment. ° °ITCHING ° If you experience itching with your medications, try taking only a single pain pill, or even half a pain pill at a time.  You can also use Benadryl over the counter for itching or also to help with sleep.  ° °TED HOSE STOCKINGS °Wear the elastic stockings on both legs for three weeks following surgery during the day but you may remove then at night for sleeping. ° °MEDICATIONS °See your medication summary on the “After Visit Summary” that the nursing staff will review with you prior to discharge.  You may have some home medications which will be placed on hold until you complete the course of blood thinner medication.  It is important for you to complete the blood thinner medication as prescribed by your surgeon.  Continue your approved medications as instructed at time of discharge. ° °PRECAUTIONS °If you experience chest pain or shortness of breath - call 911 immediately for transfer to the hospital emergency department.  °If you develop a fever greater that 101 F, purulent  drainage from wound, increased redness or drainage from wound, foul odor from the wound/dressing, or calf pain - CONTACT YOUR SURGEON.   °                                                °FOLLOW-UP APPOINTMENTS °Make sure you keep all of your appointments after your operation with your surgeon and caregivers. You should call the office at the above phone number and make an appointment for approximately two weeks after the date of your surgery or on the date instructed by your surgeon outlined in the "After Visit Summary". ° °RANGE OF MOTION AND STRENGTHENING EXERCISES  °These exercises are designed to help you keep full movement of your hip joint. Follow your caregiver's or physical therapist's instructions. Perform all exercises about fifteen times, three times per day or as directed. Exercise both hips, even if you   have had only one joint replacement. These exercises can be done on a training (exercise) mat, on the floor, on a table or on a bed. Use whatever works the best and is most comfortable for you. Use music or television while you are exercising so that the exercises are a pleasant break in your day. This will make your life better with the exercises acting as a break in routine you can look forward to.  °Lying on your back, slowly slide your foot toward your buttocks, raising your knee up off the floor. Then slowly slide your foot back down until your leg is straight again.  °Lying on your back spread your legs as far apart as you can without causing discomfort.  °Lying on your side, raise your upper leg and foot straight up from the floor as far as is comfortable. Slowly lower the leg and repeat.  °Lying on your back, tighten up the muscle in the front of your thigh (quadriceps muscles). You can do this by keeping your leg straight and trying to raise your heel off the floor. This helps strengthen the largest muscle supporting your knee.  °Lying on your back, tighten up the muscles of your buttocks both  with the legs straight and with the knee bent at a comfortable angle while keeping your heel on the floor.  ° °IF YOU ARE TRANSFERRED TO A SKILLED REHAB FACILITY °If the patient is transferred to a skilled rehab facility following release from the hospital, a list of the current medications will be sent to the facility for the patient to continue.  When discharged from the skilled rehab facility, please have the facility set up the patient's Home Health Physical Therapy prior to being released. Also, the skilled facility will be responsible for providing the patient with their medications at time of release from the facility to include their pain medication, the muscle relaxants, and their blood thinner medication. If the patient is still at the rehab facility at time of the two week follow up appointment, the skilled rehab facility will also need to assist the patient in arranging follow up appointment in our office and any transportation needs. ° °MAKE SURE YOU:  °Understand these instructions.  °Get help right away if you are not doing well or get worse.  ° ° °Pick up stool softner and laxative for home use following surgery while on pain medications. °Do not submerge incision under water. °Please use good hand washing techniques while changing dressing each day. °May shower starting three days after surgery. °Please use a clean towel to pat the incision dry following showers. °Continue to use ice for pain and swelling after surgery. °Do not use any lotions or creams on the incision until instructed by your surgeon. ° °Take Xarelto for two and a half more weeks following discharge from the hospital, then discontinue Xarelto. °Once the patient has completed the blood thinner regimen, then take a Baby 81 mg Aspirin daily for three more weeks. ° ° °Information on my medicine - XARELTO® (Rivaroxaban) ° °This medication education was reviewed with me or my healthcare representative as part of my discharge preparation.   The pharmacist that spoke with me during my hospital stay was:   ° °Why was Xarelto® prescribed for you? °Xarelto® was prescribed for you to reduce the risk of blood clots forming after orthopedic surgery. The medical term for these abnormal blood clots is venous thromboembolism (VTE). ° °What do you need to know about xarelto® ? °Take   your Xarelto® ONCE DAILY at the same time every day. °You may take it either with or without food. ° °If you have difficulty swallowing the tablet whole, you may crush it and mix in applesauce just prior to taking your dose. ° °Take Xarelto® exactly as prescribed by your doctor and DO NOT stop taking Xarelto® without talking to the doctor who prescribed the medication.  Stopping without other VTE prevention medication to take the place of Xarelto® may increase your risk of developing a clot. ° °After discharge, you should have regular check-up appointments with your healthcare provider that is prescribing your Xarelto®.   ° °What do you do if you miss a dose? °If you miss a dose, take it as soon as you remember on the same day then continue your regularly scheduled once daily regimen the next day. Do not take two doses of Xarelto® on the same day.  ° °Important Safety Information °A possible side effect of Xarelto® is bleeding. You should call your healthcare provider right away if you experience any of the following: °? Bleeding from an injury or your nose that does not stop. °? Unusual colored urine (red or dark brown) or unusual colored stools (red or black). °? Unusual bruising for unknown reasons. °? A serious fall or if you hit your head (even if there is no bleeding). ° °Some medicines may interact with Xarelto® and might increase your risk of bleeding while on Xarelto®. To help avoid this, consult your healthcare provider or pharmacist prior to using any new prescription or non-prescription medications, including herbals, vitamins, non-steroidal anti-inflammatory drugs  (NSAIDs) and supplements. ° °This website has more information on Xarelto®: www.xarelto.com. ° ° °

## 2017-07-06 NOTE — Op Note (Signed)
OPERATIVE REPORT- TOTAL HIP ARTHROPLASTY   PREOPERATIVE DIAGNOSIS: Osteoarthritis of the Right hip.   POSTOPERATIVE DIAGNOSIS: Osteoarthritis of the Right  hip.   PROCEDURE: Right total hip arthroplasty, anterior approach.   SURGEON: Ollen GrossFrank Zinnia Tindall, MD   ASSISTANT: Avel Peacerew Perkins, PA-C  ANESTHESIA:  Spinal  ESTIMATED BLOOD LOSS:-450 mL    DRAINS: Hemovac x1.   COMPLICATIONS: None   CONDITION: PACU - hemodynamically stable.   BRIEF CLINICAL NOTE: [name] is a 77 y.o. female who has advanced end-  stage arthritis of their Right  hip with progressively worsening pain and  dysfunction.The patient has failed nonoperative management and presents for  total hip arthroplasty.   PROCEDURE IN DETAIL: After successful administration of spinal  anesthetic, the traction boots [facility] were placed on both  feet and the patient [facility], boots placed into the leg  holders. The Right hip was then isolated from the perineum with plastic  drapes and prepped and draped in the usual sterile fashion. ASIS and  greater trochanter were marked and a oblique incision was made, starting  at about 1 cm lateral and 2 cm distal to the ASIS and coursing towards  the anterior cortex of the femur. The skin was cut with a 10 blade  through subcutaneous tissue to the level of the fascia overlying the  tensor fascia lata muscle. The fascia was then incised in line with the  incision at the junction of the anterior third and posterior 2/3rd. The  muscle was teased off the fascia and then the interval between the TFL  and the rectus was developed. The Hohmann retractor was then placed at  the top of the femoral neck over the capsule. The vessels overlying the  capsule were cauterized and the fat on top of the capsule was removed.  A Hohmann retractor was then placed anterior underneath the rectus  femoris to give exposure to the entire anterior capsule. A T-shaped   capsulotomy was performed. The edges were tagged and the femoral head  was identified.       Osteophytes are removed off the superior acetabulum.  The femoral neck was then cut in situ with an oscillating saw. Traction  was then applied to the left lower extremity utilizing the [facility]  traction. The femoral head was then removed. Retractors were placed  around the acetabulum and then circumferential removal of the labrum was  performed. Osteophytes were also removed. Reaming starts at 45 mm to  medialize and  Increased in 2 mm increments to 49 mm. We reamed in  approximately 40 degrees of abduction, 20 degrees anteversion. A 50 mm  pinnacle acetabular shell was then impacted in anatomic position under  fluoroscopic guidance with excellent purchase. We did not need to place  any additional dome screws. A 32 mm neutral + 4 marathon liner was then  placed into the acetabular shell.       The femoral lift was then placed along the lateral aspect of the femur  just distal to the vastus ridge. The leg was  externally rotated and capsule  was stripped off the inferior aspect of the femoral neck down to the  level of the lesser trochanter, this was done with electrocautery. The femur was lifted after this was performed. The  leg was then placed in an extended and adducted position essentially delivering the femur. We also removed the capsule superiorly and the piriformis from the piriformis  fossa to gain excellent exposure of the  proximal femur. Rongeur was used to remove some cancellous bone to get  into the lateral portion of the proximal femur for placement of the  initial starter reamer. The starter broaches was placed  the starter broach  and was shown to go down the center of the canal. Broaching  with the  Corail system was then performed starting at size 8, coursing  Up to size 11. A size 11 had excellent torsional and rotational  and axial stability. The trial high offset neck was then  placed  with a 32 + 1 trial head. The hip was then reduced. We confirmed that  the stem was in the canal both on AP and lateral x-rays. It also has excellent sizing. The hip was reduced with outstanding stability through full extension and full external rotation.. AP pelvis was taken and the leg lengths were measured and found to be equal. Hip was then dislocated again and the femoral head and neck removed. The  femoral broach was removed. Size 11 Corail stem with a high offset  neck was then impacted into the femur following native anteversion. Has  excellent purchase in the canal. Excellent torsional and rotational and  axial stability. It is confirmed to be in the canal on AP and lateral  fluoroscopic views. The 32 + 1 ceramic head was placed and the hip  reduced with outstanding stability. Again AP pelvis was taken and it  confirmed that the leg lengths were equal. The wound was then copiously  irrigated with saline solution and the capsule reattached and repaired  with Ethibond suture. 30 ml of .25% Bupivicaine was  injected into the capsule and into the edge of the tensor fascia lata as well as subcutaneous tissue. The fascia overlying the tensor fascia lata was then closed with a running #1 V-Loc. Subcu was closed with interrupted 2-0 Vicryl and subcuticular running 4-0 Monocryl. Incision was cleaned  and dried. Steri-Strips and a bulky sterile dressing applied. Hemovac  drain was hooked to suction and then the patient was awakened and transported to  recovery in stable condition.        Please note that a surgical assistant was a medical necessity for this procedure to perform it in a safe and expeditious manner. Assistant was necessary to provide appropriate retraction of vital neurovascular structures and to prevent femoral fracture and allow for anatomic placement of the prosthesis.  [name], M.D.

## 2017-07-06 NOTE — Transfer of Care (Signed)
Immediate Anesthesia Transfer of Care Note  Patient: Cheryl LambertMary Ellen Garverick  Procedure(s) Performed: RIGHT TOTAL HIP ARTHROPLASTY ANTERIOR APPROACH (Right Hip)  Patient Location: PACU  Anesthesia Type:Spinal  Level of Consciousness: awake, alert  and oriented  Airway & Oxygen Therapy: Patient Spontanous Breathing and Patient connected to face mask oxygen  Post-op Assessment: Report given to RN and Post -op Vital signs reviewed and stable  Post vital signs: Reviewed and stable  Last Vitals:  Vitals:   07/06/17 0706  BP: 127/61  Pulse: 66  Resp: 16  Temp: 36.8 C  SpO2: 99%    Last Pain:  Vitals:   07/06/17 0713  TempSrc:   PainSc: 0-No pain      Patients Stated Pain Goal: 4 (07/06/17 0713)  Complications: No apparent anesthesia complications

## 2017-07-06 NOTE — Anesthesia Postprocedure Evaluation (Signed)
Anesthesia Post Note  Patient: Cheryl LambertMary Ellen [name]  Procedure(s) Performed: RIGHT TOTAL HIP ARTHROPLASTY ANTERIOR APPROACH (Right Hip)     Patient location during evaluation: PACU Anesthesia Type: Spinal Level of consciousness: oriented and awake and alert Pain management: pain level controlled Vital Signs Assessment: post-procedure vital signs reviewed and stable Respiratory status: spontaneous breathing, respiratory function stable, patient connected to nasal cannula oxygen and nonlabored ventilation Cardiovascular status: blood pressure returned to baseline and stable Postop Assessment: no headache, no backache, no apparent nausea or vomiting, patient able to bend at knees and spinal receding Anesthetic complications: no    Last Vitals:  Vitals:   07/06/17 1045 07/06/17 1112  BP: (!) 104/58 (!) 103/46  Pulse: 61 (!) 55  Resp: 19 14  Temp: (!) 36.3 C 36.6 C  SpO2: 100% 100%    Last Pain:  Vitals:   07/06/17 1045  TempSrc:   PainSc: 3                  Metztli Sachdev A.

## 2017-07-06 NOTE — Evaluation (Signed)
Physical Therapy Evaluation Patient Details Name: [name] MRN: [id number] DOB: 1940-06-04 Today's Date: 07/06/2017   History of Present Illness  RDATHA  Clinical Impression  The patient reports slight numbness of the right thigh. Assisted to recliner  Using RW. Pt admitted with above diagnosis. Pt currently with functional limitations due to the deficits listed below (see PT Problem List).  Pt will benefit from skilled PT to increase their independence and safety with mobility to allow discharge to the venue listed below.       Follow Up Recommendations Follow surgeon's recommendation for DC plan and follow-up therapies    Equipment Recommendations  Rolling walker with 5" wheels    Recommendations for Other Services       Precautions / Restrictions Precautions Precautions: Fall      Mobility  Bed Mobility Overal bed mobility: Needs Assistance Bed Mobility: Supine to Sit     Supine to sit: Min assist;HOB elevated     General bed mobility comments: support right leg  Transfers Overall transfer level: Needs assistance Equipment used: Rolling walker (2 wheeled) Transfers: Sit to/from Stand Sit to Stand: Min assist         General transfer comment: cues for hand and right leg position  Ambulation/Gait Ambulation/Gait assistance: Min assist Ambulation Distance (Feet): 8 Feet Assistive device: Rolling walker (2 wheeled) Gait Pattern/deviations: Step-to pattern     General Gait Details: right LE appears slightly uncontrolled so asisted to Best boy    Modified Rankin (Stroke Patients Only)       Balance                                             Pertinent Vitals/Pain Pain Assessment: 0-10 Pain Score: 2  Pain Location: right buttock Pain Intervention(s): Monitored during session;Premedicated before session;Ice applied    Home Living Family/patient expects to be discharged to::  Private residence Living Arrangements: Children;Non-relatives/Friends daughter coming for 2 weeks Available Help at Discharge: Family Type of Home: House Home Access: Stairs to enter   Secretary/administrator of Steps: 1 Home Layout: Two level Home Equipment: None      Prior Function Level of Independence: Independent               Hand Dominance        Extremity/Trunk Assessment   Upper Extremity Assessment Upper Extremity Assessment: Defer to OT evaluation    Lower Extremity Assessment Lower Extremity Assessment: RLE deficits/detail RLE Deficits / Details: reports some numbness posterior leg    Cervical / Trunk Assessment Cervical / Trunk Assessment: Normal  Communication   Communication: No difficulties  Cognition Arousal/Alertness: Awake/alert Behavior During Therapy: WFL for tasks assessed/performed Overall Cognitive Status: Within Functional Limits for tasks assessed                                        General Comments      Exercises     Assessment/Plan    PT Assessment Patient needs continued PT services  PT Problem List Decreased strength;Decreased range of motion;Decreased knowledge of use of DME;Decreased activity tolerance;Decreased safety awareness;Decreased mobility;Decreased knowledge of precautions;Pain       PT Treatment Interventions DME instruction;Therapeutic exercise;Gait training;Stair  training;Functional mobility training;Therapeutic activities    PT Goals (Current goals can be found in the Care Plan section)  Acute Rehab PT Goals Patient Stated Goal: to get up, exercise PT Goal Formulation: With patient Time For Goal Achievement: 07/09/17 Potential to Achieve Goals: Good    Frequency 7X/week   Barriers to discharge        Co-evaluation               AM-PAC PT "6 Clicks" Daily Activity  Outcome Measure Difficulty turning over in bed (including adjusting bedclothes, sheets and blankets)?: A  Little Difficulty moving from lying on back to sitting on the side of the bed? : A Little Difficulty sitting down on and standing up from a chair with arms (e.g., wheelchair, bedside commode, etc,.)?: A Lot Help needed moving to and from a bed to chair (including a wheelchair)?: A Lot Help needed walking in hospital room?: A Lot Help needed climbing 3-5 steps with a railing? : A Lot 6 Click Score: 14    End of Session   Activity Tolerance: Patient tolerated treatment well Patient left: in chair;with call bell/phone within reach;with chair alarm set Nurse Communication: Mobility status PT Visit Diagnosis: Unsteadiness on feet (R26.81);Pain Pain - Right/Left: Right Pain - part of body: Hip    Time: [id number] PT Time Calculation (min) (ACUTE ONLY): 12 min   Charges:   PT Evaluation $PT Eval Low Complexity: 1 Low     PT G CodesBlanchard Kelch:        Renad Jenniges PT [phone]  Rada HayHill, Kiyah Demartini Elizabeth 07/06/2017, 4:43 PM

## 2017-07-06 NOTE — Plan of Care (Signed)
Plan of care discussed.   

## 2017-07-06 NOTE — Progress Notes (Signed)
No blood bank bracelet in pre-op chart this morning, new type and screen drawn and sent to lab.

## 2017-07-06 NOTE — Anesthesia Procedure Notes (Addendum)
Spinal  Patient location during procedure: OR Start time: 07/06/2017 8:22 AM End time: 07/06/2017 8:26 AM Staffing Resident/CRNA: Kizzie Fantasiaarver, Keiyana Stehr J, CRNA Performed: resident/CRNA  Preanesthetic Checklist Completed: patient identified, site marked, surgical consent, pre-op evaluation, timeout performed, IV checked, risks and benefits discussed and monitors and equipment checked Spinal Block Patient position: sitting Prep: Betadine Patient monitoring: heart rate, continuous pulse ox and blood pressure Approach: midline Location: L4-5 Injection technique: single-shot Needle Needle type: Quincke  Needle gauge: 22 G Needle length: 9 cm Needle insertion depth: 6 cm Additional Notes Pt sitting position, sterile prep and drape, negative paresthesia/heme.

## 2017-07-07 ENCOUNTER — Encounter (HOSPITAL_COMMUNITY): Payer: Self-pay | Admitting: Orthopedic Surgery

## 2017-07-07 LAB — BASIC METABOLIC PANEL
Anion gap: 8 (ref 5–15)
BUN: 11 mg/dL (ref 6–20)
CHLORIDE: 107 mmol/L (ref 101–111)
CO2: 26 mmol/L (ref 22–32)
Calcium: 8.5 mg/dL — ABNORMAL LOW (ref 8.9–10.3)
Creatinine, Ser: 0.6 mg/dL (ref 0.44–1.00)
GFR calc Af Amer: >60 mL/min (ref >60)
GFR calc non Af Amer: >60 mL/min (ref >60)
Glucose, Bld: 110 mg/dL — ABNORMAL HIGH (ref 65–99)
Potassium: 4.3 mmol/L (ref 3.5–5.1)
Sodium: 141 mmol/L (ref 135–145)

## 2017-07-07 LAB — CBC
HCT: 32.5 % — ABNORMAL LOW (ref 36.0–46.0)
HEMOGLOBIN: 10.8 g/dL — AB (ref 12.0–15.0)
MCH: 33.4 pg (ref 26.0–34.0)
MCHC: 33.2 g/dL (ref 30.0–36.0)
MCV: 100.6 fL — ABNORMAL HIGH (ref 78.0–100.0)
Platelets: 126 10*3/uL — ABNORMAL LOW (ref 150–400)
RBC: 3.23 MIL/uL — ABNORMAL LOW (ref 3.87–5.11)
RDW: 13.5 % (ref 11.5–15.5)
WBC: 10.2 10*3/uL (ref 4.0–10.5)

## 2017-07-07 MED ORDER — SODIUM CHLORIDE 0.9 % IV BOLUS (SEPSIS): 250.0000 mL | Freq: Once | INTRAVENOUS | Status: AC

## 2017-07-07 MED ORDER — TRAMADOL HCL 50 MG PO TABS: 50.0000 mg | ORAL_TABLET | Freq: Four times a day (QID) | ORAL | 0 refills | Status: DC | PRN

## 2017-07-07 MED ORDER — METHOCARBAMOL 500 MG PO TABS: 500.0000 mg | ORAL_TABLET | Freq: Four times a day (QID) | ORAL | 0 refills | Status: DC | PRN

## 2017-07-07 MED ORDER — RIVAROXABAN 10 MG PO TABS: 10.0000 mg | ORAL_TABLET | Freq: Every day | ORAL | 0 refills | Status: DC

## 2017-07-07 MED ORDER — SODIUM CHLORIDE 0.9 % IV BOLUS (SEPSIS)
250.0000 mL | Freq: Once | INTRAVENOUS | Status: AC
  Administered 2017-07-07: 250 mL via INTRAVENOUS

## 2017-07-07 MED ORDER — OXYCODONE HCL 5 MG PO TABS: 5.0000 mg | ORAL_TABLET | ORAL | 0 refills | Status: DC | PRN

## 2017-07-07 NOTE — Progress Notes (Signed)
Subjective: 1 Day Post-Op Procedure(s) (LRB): RIGHT TOTAL HIP ARTHROPLASTY ANTERIOR APPROACH (Right) Patient reports pain as mild.   Patient seen in rounds by Dr. Lequita Halt. Patient is well, but has had some minor complaints of pain in the hip, requiring pain medications We will resume therapy today. She took a few steps yesterday. If they do well with therapy and meets all goals, then will allow home later this afternoon following therapy. Plan is to go Home after hospital stay.  Objective: Vital signs in last 24 hours: Temp:  [97.2 F (36.2 C)-98.4 F (36.9 C)] 97.6 F (36.4 C) (02/14 0452) Pulse Rate:  [50-70] 52 (02/14 0452) Resp:  [13-19] 13 (02/14 0452) BP: (99-126)/(40-59) 101/41 (02/14 0452) SpO2:  [98 %-100 %] 98 % (02/14 0452) Weight:  [56.2 kg (124 lb)] 56.2 kg (124 lb) (02/13 1120)  Intake/Output from previous day:  Intake/Output Summary (Last 24 hours) at 07/07/2017 0833 Last data filed at 07/07/2017 0749 Gross per 24 hour  Intake 3457.5 ml  Output 3425 ml  Net 32.5 ml    Intake/Output this shift: Total I/O In: -  Out: 500 [Urine:500]  Labs: Recent Labs    07/07/17 0600  HGB 10.8*   Recent Labs    07/07/17 0600  WBC 10.2  RBC 3.23*  HCT 32.5*  PLT 126*   Recent Labs    07/07/17 0600  NA 141  K 4.3  CL 107  CO2 26  BUN 11  CREATININE 0.60  GLUCOSE 110*  CALCIUM 8.5*   No results for input(s): LABPT, INR in the last 72 hours.  EXAM General - Patient is Alert, Appropriate and Oriented Extremity - Neurovascular intact Sensation intact distally Intact pulses distally Dressing - dressing C/D/I Motor Function - intact, moving foot and toes well on exam.  Hemovac pulled without difficulty.  Past Medical History:  Diagnosis Date  . Adrenal gland dysfunction (HCC)     dr Vincente Poli, coricare B now resolved  . Anemia    iron  . Arthritis    psoriatic  in hips oa in hands  . Blood transfusion without reported diagnosis 1978  . Cataract     . chronic headaches    histamine migraines, rarely occur  . Chronic kidney disease    hx bladder infections yrs ago  . Colon polyp yrs ago  . Dysrhythmia 1996 to 1999   wore holter monitor result of accidental fall, occasional irregular pulse  . Family history of adverse reaction to anesthesia    father had aspiration after anesthesia one time  . Gastric polyps   . GERD (gastroesophageal reflux disease)   . History of migraine headaches   . Hyperlipidemia   . Hypothyroidism   . Neuromuscular disease (HCC)    fibromyalgia  . Pneumonia 2005aspiration pneumonia   stopped when got pneumonia shot, also 2015 regular pneumonia  . PONV (postoperative nausea and vomiting)    patient wishes to have no anesthetics that could affect memory due to past memory issues, no cause found  . Psoriatic arthritis (HCC)     Assessment/Plan: 1 Day Post-Op Procedure(s) (LRB): RIGHT TOTAL HIP ARTHROPLASTY ANTERIOR APPROACH (Right) Principal Problem:   OA (osteoarthritis) of hip  Estimated body mass index is 20.63 kg/m as calculated from the following:   Height as of this encounter: 5\' 5"  (1.651 m).   Weight as of this encounter: 56.2 kg (124 lb). Up with therapy Discharge home with home health  DVT Prophylaxis - Xarelto Weight Bearing As Tolerated  right Leg Hemovac Pulled Begin Therapy  If meets goals and able to go home: Discharge home with home health Diet - Cardiac diet and Renal diet Follow up - in 2 weeks Activity - WBAT Disposition - Home Condition Upon Discharge - pending therapy D/C Meds - See DC Summary DVT Prophylaxis - Xarelto  Avel Peacerew Perkins, PA-C Orthopaedic Surgery 07/07/2017, 8:33 AM

## 2017-07-07 NOTE — Progress Notes (Signed)
Physical Therapy Treatment Patient Details Name: [name] MRN: [id number] DOB: 10/18/1940 Today's Date: 07/07/2017    History of Present Illness R DA THA    PT Comments    Will practice steps with family next visit, then DC to home.   Follow Up Recommendations  Follow surgeon's recommendation for DC plan and follow-up therapies     Equipment Recommendations  Rolling walker with 5" wheels    Recommendations for Other Services       Precautions / Restrictions Precautions Precautions: Fall    Mobility  Bed Mobility Overal bed mobility: Needs Assistance Bed Mobility: Sit to Sidelying     Supine to sit: Modified independent (Device/Increase time)        Transfers Overall transfer level: Needs assistance Equipment used: Rolling walker (2 wheeled) Transfers: Sit to/from Stand Sit to Stand: Supervision         General transfer comment: cues for UE placement  Ambulation/Gait Ambulation/Gait assistance: Supervision Ambulation Distance (Feet): 200 Feet Assistive device: Rolling walker (2 wheeled) Gait Pattern/deviations: Step-through pattern     General Gait Details: good sequence noted   Stairs Stairs: Yes   Stair Management: One rail Left;Forwards;With crutches Number of Stairs: 2    Wheelchair Mobility    Modified Rankin (Stroke Patients Only)       Balance                                            Cognition Arousal/Alertness: Awake/alert                                            Exercises    General Comments        Pertinent Vitals/Pain Pain Score: 1  Pain Location: R thigh Pain Descriptors / Indicators: Discomfort;Sore Pain Intervention(s): Monitored during session    Home Living                      Prior Function            PT Goals (current goals can now be found in the care plan section) Progress towards PT goals: Progressing toward goals    Frequency    7X/week      PT Plan Current plan remains appropriate    Co-evaluation              AM-PAC PT "6 Clicks" Daily Activity  Outcome Measure  Difficulty turning over in bed (including adjusting bedclothes, sheets and blankets)?: None Difficulty moving from lying on back to sitting on the side of the bed? : None Difficulty sitting down on and standing up from a chair with arms (e.g., wheelchair, bedside commode, etc,.)?: None Help needed moving to and from a bed to chair (including a wheelchair)?: A Little Help needed walking in hospital room?: A Little Help needed climbing 3-5 steps with a railing? : A Lot 6 Click Score: 20    End of Session   Activity Tolerance: Patient tolerated treatment well Patient left: in bed Nurse Communication: Mobility status PT Visit Diagnosis: Unsteadiness on feet (R26.81);Pain Pain - Right/Left: Right Pain - part of body: Hip     Time: [id number] PT Time Calculation (min) (ACUTE ONLY): 42 min  Charges:  $Gait Training: 8-22 mins $Therapeutic  Exercise: 8-22 mins $Self Care/Home Management: 23-37                    G CodesBlanchard Kelch PT [phone]    Rada Hay 07/07/2017, 4:09 PM

## 2017-07-07 NOTE — Progress Notes (Addendum)
Physical Therapy Treatment Patient Details Name: [name] MRN: [id number] DOB: [birth year] Today's Date: 07/07/2017    History of Present Illness R DA THA    PT Comments    Progressing well. Plan to practice steps next visit.    Follow Up Recommendations  Follow surgeon's recommendation for DC plan and follow-up therapies     Equipment Recommendations  Rolling walker with 5" wheels    Recommendations for Other Services       Precautions / Restrictions Precautions Precautions: Fall    Mobility  Bed Mobility   Bed Mobility: Supine to Sit     Supine to sit: Modified independent (Device/Increase time)        Transfers   Equipment used: Rolling walker (2 wheeled) Transfers: Sit to/from Stand Sit to Stand: Supervision  Practiced using a step stool to get onto and off of a high bed with supervision,         General transfer comment: cues for UE placement  Ambulation/Gait Ambulation/Gait assistance: Supervision Ambulation Distance (Feet): 20 Feet Assistive device: Rolling walker (2 wheeled) Gait Pattern/deviations: Step-to pattern     General Gait Details: good sequence noted   Stairs            Wheelchair Mobility    Modified Rankin (Stroke Patients Only)       Balance                                            Cognition Arousal/Alertness: Awake/alert                                            Exercises Total Joint Exercises Ankle Circles/Pumps: AROM;Both;10 reps Quad Sets: AROM;Both;10 reps Short Arc Quad: AROM;Right;10 reps Heel Slides: AAROM;Right;10 reps Hip ABduction/ADduction: AAROM;Right;10 reps Long Arc Quad: AAROM;Right;10 reps    General Comments        Pertinent Vitals/Pain Pain Score: 1  Pain Location: R thigh Pain Descriptors / Indicators: Discomfort;Sore Pain Intervention(s): Monitored during session;Premedicated before session    Home Living                       Prior Function            PT Goals (current goals can now be found in the care plan section) Progress towards PT goals: Progressing toward goals    Frequency    7X/week      PT Plan      Co-evaluation              AM-PAC PT "6 Clicks" Daily Activity  Outcome Measure  Difficulty turning over in bed (including adjusting bedclothes, sheets and blankets)?: None Difficulty moving from lying on back to sitting on the side of the bed? : None Difficulty sitting down on and standing up from a chair with arms (e.g., wheelchair, bedside commode, etc,.)?: A Little Help needed moving to and from a bed to chair (including a wheelchair)?: A Little Help needed walking in hospital room?: A Little Help needed climbing 3-5 steps with a railing? : A Lot 6 Click Score: 19    End of Session   Activity Tolerance: Patient tolerated treatment well Patient left: in chair;with call bell/phone within reach Nurse Communication: Mobility status PT  Visit Diagnosis: Unsteadiness on feet (R26.81);Pain Pain - Right/Left: Right Pain - part of body: Hip     Time: [id number] PT Time Calculation (min) (ACUTE ONLY): 35 min  Charges:  $Gait Training: 8-22 mins $Therapeutic Exercise: 8-22 mins                    G CodesBlanchard [name]:       [name] PT [phone]    Rada HayHill, Lamone Ferrelli Elizabeth 07/07/2017, 4:04 PM

## 2017-07-07 NOTE — Progress Notes (Addendum)
Discharge planning, spoke with patient at bedside. [facility] Contacted [facility] for referral. Needs RW, declines 3n1, contacted [facility] to deliver to the room. [phone]

## 2017-07-07 NOTE — Progress Notes (Addendum)
Physical Therapy Treatment Patient Details Name: [name] MRN: [id number] DOB: 1940-11-02 Today's Date: 07/07/2017    History of Present Illness R DA THA    PT Comments    The patient practiced steps with family. Ready for DC to home.   Follow Up Recommendations  Follow surgeon's recommendation for DC plan and follow-up therapies     Equipment Recommendations  Rolling walker with 5" wheels    Recommendations for Other Services       Precautions / Restrictions Precautions Precautions: Fall    Mobility  Bed Mobility Overal bed mobility: Modified Independent Bed Mobility: Sit to Sidelying     Supine to sit: Modified independent (Device/Increase time)     General bed mobility comments: showed use of left foot to self assit the right.  Transfers Overall transfer level: Needs assistance Equipment used: Rolling walker (2 wheeled) Transfers: Sit to/from Stand Sit to Stand: Supervision         General transfer comment: cues for UE placement  Ambulation/Gait Ambulation/Gait assistance: Supervision Ambulation Distance (Feet): 400 Feet Assistive device: Rolling walker (2 wheeled) Gait Pattern/deviations: Step-to pattern     General Gait Details: good sequence noted   Stairs Stairs: Yes   Stair Management: One rail Left;Forwards;With crutches Number of Stairs: 8 General stair comments: family present to assistis with steps.  Wheelchair Mobility    Modified Rankin (Stroke Patients Only)       Balance                                            Cognition Arousal/Alertness: Awake/alert                                            Exercises   General Comments        Pertinent Vitals/Pain Pain Score: 1  Pain Location: R thigh Pain Descriptors / Indicators: Discomfort;Sore Pain Intervention(s): Monitored during session    Home Living                      Prior Function            PT  Goals (current goals can now be found in the care plan section) Progress towards PT goals: Progressing toward goals    Frequency    7X/week      PT Plan Current plan remains appropriate    Co-evaluation              AM-PAC PT "6 Clicks" Daily Activity  Outcome Measure  Difficulty turning over in bed (including adjusting bedclothes, sheets and blankets)?: None Difficulty moving from lying on back to sitting on the side of the bed? : None Difficulty sitting down on and standing up from a chair with arms (e.g., wheelchair, bedside commode, etc,.)?: None Help needed moving to and from a bed to chair (including a wheelchair)?: A Little Help needed walking in hospital room?: A Little Help needed climbing 3-5 steps with a railing? : A Lot 6 Click Score: 20    End of Session   Activity Tolerance: Patient tolerated treatment well Patient left: in bed Nurse Communication: Mobility status PT Visit Diagnosis: Unsteadiness on feet (R26.81);Pain Pain - Right/Left: Right Pain - part of body: Hip  Time: [id number] PT Time Calculation (min) (ACUTE ONLY): 13 min  Charges:  $Gait Training: 8-22 mins                     G Codes:          Rada HayHill, Destany Severns Elizabeth 07/07/2017, 4:19 PM

## 2017-07-07 NOTE — Discharge Summary (Addendum)
Physician Discharge Summary   Patient ID: [name] MRN: [id number] DOB/AGE: Aug 12, 1940 77 y.o.  Admit date: 07/06/2017 Discharge date: 07-07-2017  Primary Diagnosis:  Osteoarthritis of the Right  hip   Admission Diagnoses:  Past Medical History:  Diagnosis Date  . Adrenal gland dysfunction ([place])     dr Helane Rima, coricare B now resolved  . Anemia    iron  . Arthritis    psoriatic  in hips oa in hands  . Blood transfusion without reported diagnosis 1978  . Cataract   . chronic headaches    histamine migraines, rarely occur  . Chronic kidney disease    hx bladder infections yrs ago  . Colon polyp yrs ago  . Dysrhythmia 1996 to 1999   wore holter monitor result of accidental fall, occasional irregular pulse  . Family history of adverse reaction to anesthesia    father had aspiration after anesthesia one time  . Gastric polyps   . GERD (gastroesophageal reflux disease)   . History of migraine headaches   . Hyperlipidemia   . Hypothyroidism   . Neuromuscular disease ([facility])    fibromyalgia  . Pneumonia 2005aspiration pneumonia   stopped when got pneumonia shot, also 2015 regular pneumonia  . PONV (postoperative nausea and vomiting)    patient wishes to have no anesthetics that could affect memory due to past memory issues, no cause found  . Psoriatic arthritis ([place])    Discharge Diagnoses:   Principal Problem:   OA (osteoarthritis) of hip  Estimated body mass index is 20.63 kg/m as calculated from the following:   Height as of this encounter: '5\' 5"'$  (1.651 m).   Weight as of this encounter: 56.2 kg (124 lb).  Procedure(s) (LRB): RIGHT TOTAL HIP ARTHROPLASTY ANTERIOR APPROACH (Right)   Consults: None  HPI: [name] is a 77 y.o. female who has advanced end-  stage arthritis of their Right  hip with progressively worsening pain and  dysfunction.The patient has failed nonoperative management and presents for  total hip arthroplasty.    Laboratory  Data: Admission on 07/06/2017  Component Date Value Ref Range Status  . ABO/RH(D) 07/06/2017 A POS   Final  . Antibody Screen 07/06/2017 NEG   Final  . Sample Expiration 07/06/2017    Final                   Value:07/09/2017 Performed at [facility], [place] [address], [place], [place] [address]   . WBC 07/07/2017 10.2  4.0 - 10.5 K/uL Final  . RBC 07/07/2017 3.23* 3.87 - 5.11 MIL/uL Final  . Hemoglobin 07/07/2017 10.8* 12.0 - 15.0 g/dL Final  . HCT 07/07/2017 32.5* 36.0 - 46.0 % Final  . MCV 07/07/2017 100.6* 78.0 - 100.0 fL Final  . MCH 07/07/2017 33.4  26.0 - 34.0 pg Final  . MCHC 07/07/2017 33.2  30.0 - 36.0 g/dL Final  . RDW 07/07/2017 13.5  11.5 - 15.5 % Final  . Platelets 07/07/2017 126* 150 - 400 K/uL Final   Performed at [facility], [place] [address], [place], [place] [address]  . Sodium 07/07/2017 141  135 - 145 mmol/L Final  . Potassium 07/07/2017 4.3  3.5 - 5.1 mmol/L Final  . Chloride 07/07/2017 107  101 - 111 mmol/L Final  . CO2 07/07/2017 26  22 - 32 mmol/L Final  . Glucose, Bld 07/07/2017 110* 65 - 99 mg/dL Final  . BUN 07/07/2017 11  6 - 20 mg/dL Final  .  Creatinine, Ser 07/07/2017 0.60  0.44 - 1.00 mg/dL Final  . Calcium 07/07/2017 8.5* 8.9 - 10.3 mg/dL Final  . GFR calc non Af Amer 07/07/2017 >60  >60 mL/min Final  . GFR calc Af Amer 07/07/2017 >60  >60 mL/min Final   Comment: (NOTE) The eGFR has been calculated using the CKD EPI equation. This calculation has not been validated in all clinical situations. eGFR's persistently <60 mL/min signify possible Chronic Kidney Disease.   Georgiann Hahn gap 07/07/2017 8  5 - 15 Final   Performed at [facility], [place] [address] [place], [place] [address]  Hospital Outpatient Visit on 07/01/2017  Component Date Value Ref Range Status  . aPTT 07/01/2017 32  24 - 36 seconds Final   Performed at [facility], [place] [address] [place], [place] [address]  .  WBC 07/01/2017 4.8  4.0 - 10.5 K/uL Final  . RBC 07/01/2017 4.07  3.87 - 5.11 MIL/uL Final  . Hemoglobin 07/01/2017 13.3  12.0 - 15.0 g/dL Final  . HCT 07/01/2017 40.8  36.0 - 46.0 % Final  . MCV 07/01/2017 100.2* 78.0 - 100.0 fL Final  . MCH 07/01/2017 32.7  26.0 - 34.0 pg Final  . MCHC 07/01/2017 32.6  30.0 - 36.0 g/dL Final  . RDW 07/01/2017 13.5  11.5 - 15.5 % Final  . Platelets 07/01/2017 138* 150 - 400 K/uL Final   Performed at [facility], [place] [address] [place], [place] [address]  . Sodium 07/01/2017 137  135 - 145 mmol/L Final  . Potassium 07/01/2017 4.2  3.5 - 5.1 mmol/L Final  . Chloride 07/01/2017 105  101 - 111 mmol/L Final  . CO2 07/01/2017 26  22 - 32 mmol/L Final  . Glucose, Bld 07/01/2017 101* 65 - 99 mg/dL Final  . BUN 07/01/2017 16  6 - 20 mg/dL Final  . Creatinine, Ser 07/01/2017 0.64  0.44 - 1.00 mg/dL Final  . Calcium 07/01/2017 8.8* 8.9 - 10.3 mg/dL Final  . Total Protein 07/01/2017 6.5  6.5 - 8.1 g/dL Final  . Albumin 07/01/2017 4.2  3.5 - 5.0 g/dL Final  . AST 07/01/2017 24  15 - 41 U/L Final  . ALT 07/01/2017 17  14 - 54 U/L Final  . Alkaline Phosphatase 07/01/2017 49  38 - 126 U/L Final  . Total Bilirubin 07/01/2017 0.9  0.3 - 1.2 mg/dL Final  . GFR calc non Af Amer 07/01/2017 >60  >60 mL/min Final  . GFR calc Af Amer 07/01/2017 >60  >60 mL/min Final   Comment: (NOTE) The eGFR has been calculated using the CKD EPI equation. This calculation has not been validated in all clinical situations. eGFR's persistently <60 mL/min signify possible Chronic Kidney Disease.   Georgiann Hahn gap 07/01/2017 6  5 - 15 Final   Performed at [facility], [place] [address] [place], [place] [address]  . Prothrombin Time 07/01/2017 13.5  11.4 - 15.2 seconds Final  . INR 07/01/2017 1.04   Final   Performed at [facility], [place] [address], [place], [place] [address]  . ABO/RH(D) 07/01/2017 A POS   Final  . Antibody Screen  07/01/2017 NEG   Final  . Sample Expiration 07/01/2017 07/05/2017   Final  . Extend sample reason 07/01/2017    Final                   Value:NO TRANSFUSIONS OR PREGNANCY IN THE PAST 3 MONTHS Performed  at [facility], [place] [address], [place], [place] [address]   . MRSA, PCR 07/01/2017 NEGATIVE  NEGATIVE Final  . Staphylococcus aureus 07/01/2017 NEGATIVE  NEGATIVE Final   Comment: (NOTE) The Xpert SA Assay (FDA approved for NASAL specimens in patients 19 years of age and older), is one component of a comprehensive surveillance program. It is not intended to diagnose infection nor to guide or monitor treatment. Performed at [facility], [place] [address] [place], [place] [address]   . ABO/RH(D) 07/01/2017    Final                   Value:A POS Performed at [facility], [place] [address], [place], [place] [address]      X-Rays:Dg Pelvis Portable  Result Date: 07/06/2017 CLINICAL DATA:  Right total hip replacement. EXAM: PORTABLE PELVIS 1-2 VIEWS COMPARISON:  07/06/2017. FINDINGS: Right total hip arthroplasty. Surgical drain is in place. Subcutaneous and joint air are noted. Left hip is unremarkable. IMPRESSION: Right total hip arthroplasty with expected postoperative findings. Electronically Signed   By: Lorin Picket M.D.   On: 07/06/2017 10:38   Dg C-arm 1-60 Min-no Report  Result Date: 07/06/2017 Fluoroscopy was utilized by the requesting physician.  No radiographic interpretation.    EKG: Orders placed or performed in visit on 06/13/15  . EKG 12-Lead     Hospital Course: Patient was admitted to [facility] and taken to the OR and underwent the above state procedure without complications.  Patient tolerated the procedure well and was later transferred to the recovery room and then to the orthopaedic floor for postoperative care.  They were given PO and IV analgesics for pain control following their surgery.  They  were given 24 hours of postoperative antibiotics of  Anti-infectives (From admission, onward)   Start     Dose/Rate Route Frequency Ordered Stop   07/06/17 2030  vancomycin (VANCOCIN) IVPB 1000 mg/200 mL premix     1,000 mg 200 mL/hr over 60 Minutes Intravenous Every 12 hours 07/06/17 1137 07/06/17 2130   07/06/17 0700  vancomycin (VANCOCIN) IVPB 1000 mg/200 mL premix     1,000 mg 200 mL/hr over 60 Minutes Intravenous On call to O.R. 07/06/17 0700 07/06/17 0857     and started on DVT prophylaxis in the form of Xarelto.   PT and OT were ordered for total hip protocol.  The patient was allowed to be WBAT with therapy. Discharge planning was consulted to help with postop disposition and equipment needs.  Patient had a decent night on the evening of surgery.  They started to get up OOB with therapy on day one.  Hemovac drain was pulled without difficulty.  Dressing was checked and was clean and dry.  Patient was seen in rounds by Dr. Wynelle Link and was ready to go home following therapy goals.  Diet - Cardiac diet and Renal diet Follow up - in 2 weeks Activity - WBAT Disposition - Home Condition Upon Discharge - stable D/C Meds - See DC Summary DVT Prophylaxis - Xarelto     Discharge Instructions    Call MD / Call 911   Complete by:  As directed    If you experience chest pain or shortness of breath, CALL 911 and be transported to the hospital emergency room.  If you develope a fever above 101 F, pus (white drainage) or increased drainage or redness at the wound, or calf pain, call your surgeon's office.  Change dressing   Complete by:  As directed    You may change your dressing dressing daily with sterile 4 x 4 inch gauze dressing and paper tape.  Do not submerge the incision under water.   Constipation Prevention   Complete by:  As directed    Drink plenty of fluids.  Prune juice may be helpful.  You may use a stool softener, such as Colace (over the counter) 100 mg twice a day.  Use  MiraLax (over the counter) for constipation as needed.   Diet - low sodium heart healthy   Complete by:  As directed    Discharge instructions   Complete by:  As directed    Take Xarelto for two and a half more weeks, then discontinue Xarelto. Once the patient has completed the blood thinner regimen, then take a Baby 81 mg Aspirin daily for three more weeks.   Pick up stool softner and laxative for home use following surgery while on pain medications. Do not submerge incision under water. Please use good hand washing techniques while changing dressing each day. May shower starting three days after surgery. Please use a clean towel to pat the incision dry following showers. Continue to use ice for pain and swelling after surgery. Do not use any lotions or creams on the incision until instructed by your surgeon.  Wear both TED hose on both legs during the day every day for three weeks, but may remove the TED hose at night at home.  Postoperative Constipation Protocol  Constipation - defined medically as fewer than three stools per week and severe constipation as less than one stool per week.  One of the most common issues patients have following surgery is constipation.  Even if you have a regular bowel pattern at home, your normal regimen is likely to be disrupted due to multiple reasons following surgery.  Combination of anesthesia, postoperative narcotics, change in appetite and fluid intake all can affect your bowels.  In order to avoid complications following surgery, here are some recommendations in order to help you during your recovery period.  Colace (docusate) - Pick up an over-the-counter form of Colace or another stool softener and take twice a day as long as you are requiring postoperative pain medications.  Take with a full glass of water daily.  If you experience loose stools or diarrhea, hold the colace until you stool forms back up.  If your symptoms do not get better within 1  week or if they get worse, check with your doctor.  Dulcolax (bisacodyl) - Pick up over-the-counter and take as directed by the product packaging as needed to assist with the movement of your bowels.  Take with a full glass of water.  Use this product as needed if not relieved by Colace only.   MiraLax (polyethylene glycol) - Pick up over-the-counter to have on hand.  MiraLax is a solution that will increase the amount of water in your bowels to assist with bowel movements.  Take as directed and can mix with a glass of water, juice, soda, coffee, or tea.  Take if you go more than two days without a movement. Do not use MiraLax more than once per day. Call your doctor if you are still constipated or irregular after using this medication for 7 days in a row.  If you continue to have problems with postoperative constipation, please contact the office for further assistance and recommendations.  If you experience "the worst abdominal pain ever"  or develop nausea or vomiting, please contact the office immediatly for further recommendations for treatment.   Do not sit on low chairs, stoools or toilet seats, as it may be difficult to get up from low surfaces   Complete by:  As directed    Driving restrictions   Complete by:  As directed    No driving until released by the physician.   Increase activity slowly as tolerated   Complete by:  As directed    Lifting restrictions   Complete by:  As directed    No lifting until released by the physician.   Patient may shower   Complete by:  As directed    You may shower without a dressing once there is no drainage.  Do not wash over the wound.  If drainage remains, do not shower until drainage stops.   TED hose   Complete by:  As directed    Use stockings (TED hose) for 3 weeks on both leg(s).  You may remove them at night for sleeping.   Weight bearing as tolerated   Complete by:  As directed    Laterality:  right   Extremity:  Lower     Allergies as  of 07/07/2017      Reactions   Imitrex [sumatriptan] Nausea And Vomiting, Hypertension   Nsaids Other (See Comments)   Gerd as result taking.   Keflex [cephalexin] Nausea And Vomiting      Medication List    STOP taking these medications   butalbital-aspirin-caffeine 50-325-40 MG capsule Commonly known as:  FIORINAL   CLIMARA 0.1 mg/24hr patch Generic drug:  estradiol   hydroxychloroquine 200 MG tablet Commonly known as:  PLAQUENIL   progesterone 100 MG capsule Commonly known as:  PROMETRIUM   sulfaSALAzine 500 MG EC tablet Commonly known as:  AZULFIDINE   Testosterone Propionate Powd   Vitamin D3 1000 units Caps     TAKE these medications   clobetasol 0.05 % external solution Commonly known as:  TEMOVATE Apply 1 application topically 2 (two) times daily as needed (psorasis).   CRESTOR 5 MG tablet Generic drug:  rosuvastatin Take 5 mg by mouth daily.   fluocinonide 0.05 % external solution Commonly known as:  LIDEX Apply 1 application topically 2 (two) times daily as needed (for psorasis.).   gabapentin 100 MG capsule Commonly known as:  NEURONTIN Take 2 capsules (200 mg total) by mouth at bedtime. What changed:  how much to take   Magnesium Oxide 250 MG Tabs Take 500 mg by mouth daily.   methocarbamol 500 MG tablet Commonly known as:  ROBAXIN Take 1 tablet (500 mg total) by mouth every 6 (six) hours as needed for muscle spasms.   oxyCODONE 5 MG immediate release tablet Commonly known as:  Oxy IR/ROXICODONE Take 1-2 tablets (5-10 mg total) by mouth every 4 (four) hours as needed for moderate pain or severe pain.   rivaroxaban 10 MG Tabs tablet Commonly known as:  XARELTO Take 1 tablet (10 mg total) by mouth daily with breakfast. Take Xarelto for two and a half more weeks following discharge from the hospital, then discontinue Xarelto. Once the patient has completed the blood thinner regimen, then take a Baby 81 mg Aspirin daily for three more  weeks. Start taking on:  07/08/2017   SYNTHROID 50 MCG tablet Generic drug:  levothyroxine Take 50 mcg by mouth every morning.   traMADol 50 MG tablet Commonly known as:  ULTRAM Take 1-2 tablets (50-100 mg total)  by mouth every 6 (six) hours as needed (mild pain).            Discharge Care Instructions  (From admission, onward)        Start     Ordered   07/07/17 0000  Weight bearing as tolerated    Question Answer Comment  Laterality right   Extremity Lower      07/07/17 0848   07/07/17 0000  Change dressing    Comments:  You may change your dressing dressing daily with sterile 4 x 4 inch gauze dressing and paper tape.  Do not submerge the incision under water.   07/07/17 0848     Follow-up Information    Gaynelle Arabian, MD. Schedule an appointment as soon as possible for a visit on 07/19/2017.   Specialty:  Orthopedic Surgery Contact information: [address] [place] [address] [phone]           Signed: Arlee Muslim, PA-C Orthopaedic Surgery 07/07/2017, 8:49 AM

## 2017-07-07 NOTE — Evaluation (Addendum)
Occupational Therapy Evaluation Patient Details Name: [name] MRN: [id number] DOB: 09/21/1940 Today's Date: 07/07/2017    History of Present Illness R DA THA   Clinical Impression   This 77 year old female was admitted for the above.  All education was completed. No further OT is needed at this time     Follow Up Recommendations  Supervision/Assistance - 24 hour    Equipment Recommendations  None recommended by OT(pt wants to try standard commode at home)    Recommendations for Other Services       Precautions / Restrictions Precautions Precautions: Fall Restrictions Weight Bearing Restrictions: No      Mobility Bed Mobility         Supine to sit: Min assist     General bed mobility comments: from flat bed.  Min support for RLE due to pain  Transfers   Equipment used: Rolling walker (2 wheeled)   Sit to Stand: Min guard         General transfer comment: cues for UE placement    Balance                                           ADL either performed or assessed with clinical judgement   ADL Overall ADL's : Needs assistance/impaired Eating/Feeding: Independent   Grooming: Oral care;Wash/dry hands;Wash/dry face;Min guard;Standing(and makeup)   Upper Body Bathing: Set up;Sitting   Lower Body Bathing: Minimal assistance;Sit to/from stand   Upper Body Dressing : Set up;Sitting   Lower Body Dressing: Moderate assistance;Sit to/from stand   Toilet Transfer: Min guard;Ambulation;Comfort height toilet;RW   Toileting- ArchitectClothing Manipulation and Hygiene: Min guard;Sit to/from stand         General ADL Comments: ambulated to bathroom with min guard for safety. Pt has a standard commode at home, with nothing to push from. She feels she will be able to manage and wants to try this.  Tried both 3:1 then comfort height commode with cues to reach back for and push up from commode seat. Pt is flexible sitting and needs assistance  for socks/shoes. Pt has a sock aide, but will have family assist her. Reminded her that sock aide will not work for ted hose.  Demonstrated sidestepping into tub when ready. She has grab bars at entry and far wall.     Vision         Perception     Praxis      Pertinent Vitals/Pain Pain Score: 2  Pain Location: R thigh Pain Intervention(s): Limited activity within patient's tolerance;Monitored during session;Premedicated before session;Repositioned     Hand Dominance     Extremity/Trunk Assessment Upper Extremity Assessment Upper Extremity Assessment: Overall WFL for tasks assessed           Communication Communication Communication: No difficulties   Cognition Arousal/Alertness: Awake/alert Behavior During Therapy: WFL for tasks assessed/performed Overall Cognitive Status: Within Functional Limits for tasks assessed                                     General Comments       Exercises     Shoulder Instructions      Home Living Family/patient expects to be discharged to:: Private residence Living Arrangements: Children;Non-relatives/Friends Available Help at Discharge: Family  Bathroom Shower/Tub: Chief Strategy Officer: Standard     Home Equipment: Grab bars - tub/shower          Prior Functioning/Environment Level of Independence: Independent                 OT Problem List:        OT Treatment/Interventions:      OT Goals(Current goals can be found in the care plan section) Acute Rehab OT Goals Patient Stated Goal: to get up, exercise OT Goal Formulation: All assessment and education complete, DC therapy  OT Frequency:     Barriers to D/C:            Co-evaluation              AM-PAC PT "6 Clicks" Daily Activity     Outcome Measure Help from another person eating meals?: None Help from another person taking care of personal grooming?: A Little Help from another person  toileting, which includes using toliet, bedpan, or urinal?: A Little Help from another person bathing (including washing, rinsing, drying)?: A Little Help from another person to put on and taking off regular upper body clothing?: A Little Help from another person to put on and taking off regular lower body clothing?: A Lot 6 Click Score: 18   End of Session    Activity Tolerance: Patient tolerated treatment well Patient left: in chair;with call bell/phone within reach  OT Visit Diagnosis: Pain Pain - Right/Left: Right Pain - part of body: Hip                Time: 1610-9604 OT Time Calculation (min): 43 min Charges:  OT General Charges $OT Visit: 1 Visit OT Evaluation $OT Eval Low Complexity: 1 Low OT Treatments $Self Care/Home Management : 23-37 mins G-Codes:     [place], OTR/L [phone] 07/07/2017  Rahcel Shutes,Samauri 07/07/2017, 10:18 AM

## 2017-09-07 ENCOUNTER — Other Ambulatory Visit: Payer: Self-pay | Admitting: Physical Medicine & Rehabilitation

## 2017-09-22 ENCOUNTER — Emergency Department (HOSPITAL_COMMUNITY): Payer: Medicare Other

## 2017-09-22 ENCOUNTER — Other Ambulatory Visit: Payer: Self-pay

## 2017-09-22 ENCOUNTER — Emergency Department (HOSPITAL_COMMUNITY)
Admission: EM | Admit: 2017-09-22 | Discharge: 2017-09-22 | Disposition: A | Discharge status: Home / Self Care | Payer: Medicare Other | Attending: Emergency Medicine | Admitting: Emergency Medicine

## 2017-09-22 ENCOUNTER — Encounter (HOSPITAL_COMMUNITY): Payer: Self-pay | Admitting: Emergency Medicine

## 2017-09-22 DIAGNOSIS — J189 Pneumonia, unspecified organism: Secondary | ICD-10-CM | POA: Diagnosis not present

## 2017-09-22 DIAGNOSIS — E039 Hypothyroidism, unspecified: Secondary | ICD-10-CM | POA: Insufficient documentation

## 2017-09-22 DIAGNOSIS — Z79899 Other long term (current) drug therapy: Secondary | ICD-10-CM | POA: Insufficient documentation

## 2017-09-22 DIAGNOSIS — Z96641 Presence of right artificial hip joint: Secondary | ICD-10-CM | POA: Diagnosis not present

## 2017-09-22 DIAGNOSIS — N189 Chronic kidney disease, unspecified: Secondary | ICD-10-CM | POA: Diagnosis not present

## 2017-09-22 DIAGNOSIS — J181 Lobar pneumonia, unspecified organism: Secondary | ICD-10-CM

## 2017-09-22 DIAGNOSIS — Z7901 Long term (current) use of anticoagulants: Secondary | ICD-10-CM | POA: Insufficient documentation

## 2017-09-22 DIAGNOSIS — R05 Cough: Secondary | ICD-10-CM | POA: Diagnosis present

## 2017-09-22 LAB — CBC WITH DIFFERENTIAL/PLATELET
BASOS PCT: 0 %
Basophils Absolute: 0 10*3/uL (ref 0.0–0.1)
EOS ABS: 0 10*3/uL (ref 0.0–0.7)
EOS PCT: 0 %
HCT: 40.2 % (ref 36.0–46.0)
HEMOGLOBIN: 13.3 g/dL (ref 12.0–15.0)
Lymphocytes Relative: 5 %
Lymphs Abs: 1 10*3/uL (ref 0.7–4.0)
MCH: 32.8 pg (ref 26.0–34.0)
MCHC: 33.1 g/dL (ref 30.0–36.0)
MCV: 99 fL (ref 78.0–100.0)
Monocytes Absolute: 1.1 10*3/uL — ABNORMAL HIGH (ref 0.1–1.0)
Monocytes Relative: 5 %
NEUTROS PCT: 90 %
Neutro Abs: 19 10*3/uL — ABNORMAL HIGH (ref 1.7–7.7)
PLATELETS: 159 10*3/uL (ref 150–400)
RBC: 4.06 MIL/uL (ref 3.87–5.11)
RDW: 14.5 % (ref 11.5–15.5)
WBC: 21.1 10*3/uL — AB (ref 4.0–10.5)

## 2017-09-22 LAB — COMPREHENSIVE METABOLIC PANEL
ALBUMIN: 4 g/dL (ref 3.5–5.0)
ALK PHOS: 72 U/L (ref 38–126)
ALT: 15 U/L (ref 14–54)
ANION GAP: 10 (ref 5–15)
AST: 22 U/L (ref 15–41)
BUN: 13 mg/dL (ref 6–20)
CALCIUM: 8.9 mg/dL (ref 8.9–10.3)
CHLORIDE: 104 mmol/L (ref 101–111)
CO2: 22 mmol/L (ref 22–32)
Creatinine, Ser: 0.69 mg/dL (ref 0.44–1.00)
GFR calc non Af Amer: >60 mL/min (ref >60)
GLUCOSE: 116 mg/dL — AB (ref 65–99)
Potassium: 3.6 mmol/L (ref 3.5–5.1)
SODIUM: 136 mmol/L (ref 135–145)
Total Bilirubin: 1.5 mg/dL — ABNORMAL HIGH (ref 0.3–1.2)
Total Protein: 6.6 g/dL (ref 6.5–8.1)

## 2017-09-22 LAB — I-STAT CG4 LACTIC ACID, ED: Lactic Acid, Venous: 1.78 mmol/L (ref 0.5–1.9)

## 2017-09-22 MED ORDER — SODIUM CHLORIDE 0.9 % IV SOLN
500.0000 mg | Freq: Once | INTRAVENOUS | Status: AC
  Administered 2017-09-22: 500 mg via INTRAVENOUS
  Filled 2017-09-22: qty 500

## 2017-09-22 MED ORDER — ONDANSETRON HCL 4 MG/2ML IJ SOLN
4.0000 mg | Freq: Once | INTRAMUSCULAR | Status: AC
  Administered 2017-09-22: 4 mg via INTRAVENOUS
  Filled 2017-09-22: qty 2

## 2017-09-22 MED ORDER — AMOXICILLIN-POT CLAVULANATE 875-125 MG PO TABS: 1.0000 | ORAL_TABLET | Freq: Two times a day (BID) | ORAL | 0 refills | Status: AC

## 2017-09-22 MED ORDER — SODIUM CHLORIDE 0.9 % IV BOLUS
1000.0000 mL | Freq: Once | INTRAVENOUS | Status: AC
  Administered 2017-09-22: 1000 mL via INTRAVENOUS

## 2017-09-22 MED ORDER — CEFTRIAXONE SODIUM 2 G IJ SOLR
2.0000 g | Freq: Once | INTRAMUSCULAR | Status: AC
  Administered 2017-09-22: 2 g via INTRAVENOUS
  Filled 2017-09-22: qty 20

## 2017-09-22 MED ORDER — ONDANSETRON 4 MG PO TBDP: 4.0000 mg | ORAL_TABLET | Freq: Three times a day (TID) | ORAL | 0 refills | Status: AC | PRN

## 2017-09-22 NOTE — ED Notes (Signed)
Coming from PCP-states she spiked a fever of 102.3 last night-states hypotensive, elevated HR-states she had a hip replacement 6 weeks ago-sending here for septic work up

## 2017-09-22 NOTE — ED Provider Notes (Signed)
[facility]-EMERGENCY DEPT Provider Note  CSN: [id number] Arrival date & time: 09/22/17 1305  Chief Complaint(s) Cough and Generalized Body Aches  HPI [name] is a 77 y.o. female   The history is provided by the patient.  Cough  This is a new problem. The current episode started yesterday. The problem occurs every few minutes. The problem has not changed since onset.The cough is productive of sputum. Maximum temperature: subjective. Associated symptoms include chills, myalgias and shortness of breath. She is not a smoker. Her past medical history is significant for pneumonia.    Past Medical History Past Medical History:  Diagnosis Date  . Adrenal gland dysfunction (HCC)     dr Vincente Poli, coricare B now resolved  . Anemia    iron  . Arthritis    psoriatic  in hips oa in hands  . Blood transfusion without reported diagnosis 1978  . Cataract   . chronic headaches    histamine migraines, rarely occur  . Chronic kidney disease    hx bladder infections yrs ago  . Colon polyp yrs ago  . Dysrhythmia 1996 to 1999   wore holter monitor result of accidental fall, occasional irregular pulse  . Family history of adverse reaction to anesthesia    father had aspiration after anesthesia one time  . Gastric polyps   . GERD (gastroesophageal reflux disease)   . History of migraine headaches   . Hyperlipidemia   . Hypothyroidism   . Neuromuscular disease (HCC)    fibromyalgia  . Pneumonia 2005aspiration pneumonia   stopped when got pneumonia shot, also 2015 regular pneumonia  . PONV (postoperative nausea and vomiting)    patient wishes to have no anesthetics that could affect memory due to past memory issues, no cause found  . Psoriatic arthritis [facility])    Patient Active Problem List   Diagnosis Date Noted  . OA (osteoarthritis) of hip 07/06/2017  . Cervical myofascial pain syndrome 07/27/2016  . Fibromyalgia syndrome 07/27/2016  . Cervical disc disorder  with radiculopathy of cervical region 03/16/2016  . Cervical spondylosis without myelopathy 03/16/2016  . Contracture of joint of right shoulder region 03/16/2016  . DDD (degenerative disc disease), cervical 12/01/2015  . MCI (mild cognitive impairment) 12/01/2015  . Polyarthralgia 11/08/2013  . Memory difficulties 11/08/2013  . Associative aphasia 11/08/2013  . Adrenal gland dysfunction (HCC)   . Memory disturbance 09/12/2013  . DYSPEPSIA 06/10/2009   Home Medication(s) Prior to Admission medications   Medication Sig Start Date End Date Taking? Authorizing Provider  butalbital-aspirin-caffeine [facility]) 50-325-40 MG capsule Take 1 tablet by mouth every 4 (four) hours as needed for headache.   Yes [provider]  CRESTOR 5 MG tablet Take 5 mg by mouth daily.  03/02/12  Yes [provider]  estradiol (CLIMARA) 0.1 mg/24hr patch Climara 0.1 mg/24 hr transdermal patch   Yes [provider]  fluocinonide (LIDEX) 0.05 % external solution Apply 1 application topically 2 (two) times daily as needed (for psorasis.).    Yes [provider]  gabapentin (NEURONTIN) 100 MG capsule Take 2 capsules (200 mg total) by mouth at bedtime. 01/18/17  Yes Kirsteins, Victorino Sparrow, MD  hydroxychloroquine (PLAQUENIL) 200 MG tablet Take 300 mg by mouth daily. 08/17/17 11/15/17 Yes [provider]  Magnesium Oxide 250 MG TABS Take 250-500 mg by mouth daily. 250 mg in the am and 500 mg in the pm   Yes [provider]  progesterone (PROMETRIUM) 100 MG capsule Take 100  mg by mouth at bedtime.   Yes [provider]  sulfaSALAzine (AZULFIDINE) 500 MG tablet Take 1,000-1,500 mg by mouth 2 (two) times daily. 1000 mg in the am and 1500 mg at night   Yes [provider]  SYNTHROID 50 MCG tablet Take 50 mcg by mouth every morning.  02/22/12  Yes [provider]  TESTOSTERONE PROPIONATE IJ Place 1 application onto the skin 3 (three) times a week.   Yes  [provider]  amoxicillin-clavulanate (AUGMENTIN) 875-125 MG tablet Take 1 tablet by mouth every 12 (twelve) hours for 10 days. 09/22/17 10/02/17  Nira Conn, MD  methocarbamol (ROBAXIN) 500 MG tablet Take 1 tablet (500 mg total) by mouth every 6 (six) hours as needed for muscle spasms. Patient not taking: Reported on 09/22/2017 07/07/17   Julien Girt, Alexzandrew L, PA-C  ondansetron (ZOFRAN ODT) 4 MG disintegrating tablet Take 1 tablet (4 mg total) by mouth every 8 (eight) hours as needed for up to 3 days for nausea or vomiting. 09/22/17 09/25/17  Nira Conn, MD  oxyCODONE (OXY IR/ROXICODONE) 5 MG immediate release tablet Take 1-2 tablets (5-10 mg total) by mouth every 4 (four) hours as needed for moderate pain or severe pain. Patient not taking: Reported on 09/22/2017 07/07/17   Julien Girt, Alexzandrew L, PA-C  rivaroxaban (XARELTO) 10 MG TABS tablet Take 1 tablet (10 mg total) by mouth daily with breakfast. Take Xarelto for two and a half more weeks following discharge from the hospital, then discontinue Xarelto. Once the patient has completed the blood thinner regimen, then take a Baby 81 mg Aspirin daily for three more weeks. Patient not taking: Reported on 09/22/2017 07/08/17   Julien Girt, Alexzandrew L, PA-C  traMADol (ULTRAM) 50 MG tablet Take 1-2 tablets (50-100 mg total) by mouth every 6 (six) hours as needed (mild pain). Patient not taking: Reported on 09/22/2017 07/07/17   Lauraine Rinne, PA-C                                                                                                                                    Past Surgical History Past Surgical History:  Procedure Laterality Date  . ABDOMINAL HYSTERECTOMY     left ovary left and tube left  . APPENDECTOMY     with hysterectomy  . CARDIAC CATHETERIZATION    . CATARACT EXTRACTION, BILATERAL    . CHOLECYSTECTOMY    . COLONOSCOPY    . FINGER SURGERY     middle finger right hand  . INCONTINENCE SURGERY       bladder sling  . RECTOCELE REPAIR    . RHINOPLASTY    . SHOULDER ARTHROSCOPY WITH ROTATOR CUFF REPAIR AND SUBACROMIAL DECOMPRESSION Right 06/17/2015   Procedure: SHOULDER DIAGNOSTIC OPERATIVE ARTHROSCOPY WITH MINI-OPEN ROTATOR CUFF REPAIR AND SUBACROMIAL DECOMPRESSION;  Surgeon: Cammy Copa, MD;  Location: [facility] OR;  Service: Orthopedics;  Laterality: Right;  . TONSILLECTOMY  age 10  adenoids also  . TOTAL HIP ARTHROPLASTY Right 07/06/2017   Procedure: RIGHT TOTAL HIP ARTHROPLASTY ANTERIOR APPROACH;  Surgeon: Ollen Gross, MD;  Location: [facility] ORS;  Service: Orthopedics;  Laterality: Right;   Family History Family History  Problem Relation Age of Onset  . Esophageal cancer Father   . Stomach cancer Father   . Hypertension Father   . Colon cancer Mother   . Breast cancer Mother   . Depression Mother   . Anxiety disorder Mother   . Heart attack Paternal Grandfather   . Diabetes Paternal Grandmother   . Liver cancer Maternal Grandmother   . Rectal cancer Neg Hx     Social History Social History   Tobacco Use  . Smoking status: Never Smoker  . Smokeless tobacco: Never Used  Substance Use Topics  . Alcohol use: Yes    Alcohol/week: 0.0 oz    Comment: 2 ounce red wine occ  . Drug use: No   Allergies Imitrex [sumatriptan]; Nsaids; Ciprofloxacin hcl; and Keflex [cephalexin]  Review of Systems Review of Systems  Constitutional: Positive for chills.  Respiratory: Positive for cough and shortness of breath.   Musculoskeletal: Positive for myalgias.   All other systems are reviewed and are negative for acute change except as noted in the HPI  Physical Exam Vital Signs  I have reviewed the triage vital signs BP (!) 107/41   Pulse 98   Temp 98.4 F (36.9 C) (Oral)   Resp 18   Ht  (1.676 m)   Wt 57.2 kg (126 lb)   SpO2 93%   BMI 20.34 kg/m   Physical Exam  Constitutional: She is oriented to person, place, and time. She appears well-developed and  well-nourished. No distress.  HENT:  Head: Normocephalic and atraumatic.  Nose: Nose normal.  Eyes: Pupils are equal, round, and reactive to light. Conjunctivae and EOM are normal. Right eye exhibits no discharge. Left eye exhibits no discharge. No scleral icterus.  Neck: Normal range of motion. Neck supple.  Cardiovascular: Normal rate and regular rhythm. Exam reveals no gallop and no friction rub.  No murmur heard. Pulmonary/Chest: Effort normal. No stridor. No respiratory distress. She has rales in the right middle field, the right lower field and the left lower field.  Abdominal: Soft. She exhibits no distension. There is no tenderness.  Musculoskeletal: She exhibits no edema or tenderness.  Neurological: She is alert and oriented to person, place, and time.  Skin: Skin is warm and dry. No rash noted. She is not diaphoretic. No erythema.  Psychiatric: She has a normal mood and affect.  Vitals reviewed.   ED Results and Treatments Labs (all labs ordered are listed, but only abnormal results are displayed) Labs Reviewed  CBC WITH DIFFERENTIAL/PLATELET - Abnormal; Notable for the following components:      Result Value   WBC 21.1 (*)    Neutro Abs 19.0 (*)    Monocytes Absolute 1.1 (*)    All other components within normal limits  COMPREHENSIVE METABOLIC PANEL - Abnormal; Notable for the following components:   Glucose, Bld 116 (*)    Total Bilirubin 1.5 (*)    All other components within normal limits  CULTURE, BLOOD (ROUTINE X 2)  CULTURE, BLOOD (ROUTINE X 2)  I-STAT CG4 LACTIC ACID, ED  EKG  EKG Interpretation  Date/Time:  Thursday Sep 22 2017 16:13:16 EDT Ventricular Rate:  91 PR Interval:    QRS Duration: 102 QT Interval:  432 QTC Calculation: 532 R Axis:   82 Text Interpretation:  Sinus rhythm Borderline right axis deviation RSR' in V1 or V2,  probably normal variant Minimal ST depression, diffuse leads Prolonged QT interval NO STEMI Confirmed by Drema Pry [phone]) on 09/22/2017 4:16:44 PM      Radiology Dg Chest 2 View  Result Date: 09/22/2017 CLINICAL DATA:  77 y/o F; fever, cough, production, shortness of breath. EXAM: CHEST - 2 VIEW COMPARISON:  10/12/2014 chest radiograph FINDINGS: Stable normal cardiac silhouette given projection and technique. Aortic atherosclerosis with calcification. Small patchy consolidation within the dependent lower lobes. No pleural effusion or pneumothorax. No acute osseous abnormality is evident. IMPRESSION: Small patchy consolidation within dependent lower lobes, probably pneumonia. Electronically Signed   By: Mitzi Hansen M.D.   On: 09/22/2017 14:34   Pertinent labs & imaging results that were available during my care of the patient were reviewed by me and considered in my medical decision making (see chart for details).  Medications Ordered in ED Medications  cefTRIAXone (ROCEPHIN) 2 g in sodium chloride 0.9 % 100 mL IVPB (0 g Intravenous Stopped 09/22/17 1630)  azithromycin (ZITHROMAX) 500 mg in sodium chloride 0.9 % 250 mL IVPB (0 mg Intravenous Stopped 09/22/17 1704)  sodium chloride 0.9 % bolus 1,000 mL (0 mLs Intravenous Stopped 09/22/17 1704)  ondansetron (ZOFRAN) injection 4 mg (4 mg Intravenous Given 09/22/17 1547)                                                                                                                                    Procedures Procedures  (including critical care time)  Medical Decision Making / ED Course I have reviewed the nursing notes for this encounter and the patient's prior records (if available in EHR or on provided paperwork).    Work-up consistent with pneumonia.  Patient is well-appearing, afebrile with stable vital signs.  She does have significant leukocytosis without elevated lactic acid.  Other labs grossly reassuring without significant  electrolyte derangements or renal insufficiency.  Given age and immunosuppression drug use, patient will be treated with initial doses of IV antibiotics in the emergency department.  She also be given IV fluids and reassess.  After treatment patient reports significant improvement and her fatigue and myalgias.  She feels that she is stable for discharge home. The patient is safe for discharge with strict return precautions.  The patient appears reasonably screened and/or stabilized for discharge and I doubt any other medical condition or other [facility] requiring further screening, evaluation, or treatment in the ED at this time prior to discharge.   Final Clinical Impression(s) / ED Diagnoses Final diagnoses:  Pneumonia of both lower lobes due to infectious organism [facility])    Disposition: Discharge  Condition: Good  I have discussed the results, Dx and Tx plan with the patient who expressed understanding and agree(s) with the plan. Discharge instructions discussed at great length. The patient was given strict return precautions who verbalized understanding of the instructions. No further questions at time of discharge.    ED Discharge Orders        Ordered    amoxicillin-clavulanate (AUGMENTIN) 875-125 MG tablet  Every 12 hours     09/22/17 1823    ondansetron (ZOFRAN ODT) 4 MG disintegrating tablet  Every 8 hours PRN     09/22/17 1823       Follow Up: Merri Brunette, MD [address] [place] [address] [phone]  Schedule an appointment as soon as possible for a visit  in 3-5 days, If symptoms do not improve or  worsen     This chart was dictated using voice recognition software.  Despite best efforts to proofread,  errors can occur which can change the documentation meaning.   Nira Conn, MD 09/22/17 [phone]

## 2017-09-22 NOTE — ED Triage Notes (Signed)
Pt reports last night around 1130pm when friend left her house felt bad all of sudden, had fever 102 and productive cough. Called EMS who came out and told her she had erratic heart rate. Pt denies going for medical treatment or taking anything for fever. Went to her PCP who sent her to ED for further testing. Hx PNA

## 2017-09-27 LAB — CULTURE, BLOOD (ROUTINE X 2)
CULTURE: NO GROWTH
CULTURE: NO GROWTH
SPECIAL REQUESTS: ADEQUATE
SPECIAL REQUESTS: ADEQUATE

## 2018-02-16 ENCOUNTER — Ambulatory Visit: Payer: Medicare Other | Admitting: Neurology

## 2018-02-16 ENCOUNTER — Encounter: Payer: Self-pay | Admitting: Neurology

## 2018-02-16 VITALS — BP 109/64 | HR 81 | Ht 65.5 in | Wt 132.0 lb

## 2018-02-16 DIAGNOSIS — E279 Disorder of adrenal gland, unspecified: Secondary | ICD-10-CM | POA: Diagnosis not present

## 2018-02-16 DIAGNOSIS — L4050 Arthropathic psoriasis, unspecified: Secondary | ICD-10-CM

## 2018-02-16 DIAGNOSIS — G4701 Insomnia due to medical condition: Secondary | ICD-10-CM

## 2018-02-16 DIAGNOSIS — G8929 Other chronic pain: Secondary | ICD-10-CM

## 2018-02-16 DIAGNOSIS — G5721 Lesion of femoral nerve, right lower limb: Secondary | ICD-10-CM | POA: Diagnosis not present

## 2018-02-16 NOTE — Progress Notes (Signed)
[facility]  Provider:  Melvyn Novas, M D  Referring Provider: Merri Brunette, MD Primary Care Physician:  Merri Brunette, MD  Chief Complaint  Patient presents with  . Follow-up    pt alone, rm 10. pt states that her pain has started to flare up in neck and right shoulder. in [place] she had a hip replacement which has caused nerve pain that runs down the right leg. wants to discuss new medication trazadone and gabapentin.     HPI:  02-16-2018, Rv for this established caucasian right handed female patient with a longstanding history of subjective memory concerns. Her serial MOCA test shows no deficits. She has pain from her hip replacement. She has chronic neck -shoulder pain- Dr Doroteo Bradford . This affects her sleep, and trazodone and gabapentin were prescribed to help. Amitriptyline left her irritable. I had asked her to use Gapapentin at night, helping a little not enough. She has sciatica and femoral nerve pain on the right leg.  Shoulder on the right is tense. She wakes once in the middle of the night after 3-4 hours of sleep, with a severe pain all over. If she sleeps again and then wakes in AM there is much less pain. Trazodone prescribed at 50 mg -she as not picked it up yet.      Geriatric depression score 0/ 15, Epworth score not obtained.    Montreal Cognitive Assessment  02/16/2018 02/15/2017 07/07/2016 12/01/2015 03/12/2014  Visuospatial/ Executive (0/5) 5 5 3 5 5   Naming (0/3) 3 3 3 3 3   Attention: Read list of digits (0/2) 2 2 2 2 2   Attention: Read list of letters (0/1) 1 1 1 1 1   Attention: Serial 7 subtraction starting at 100 (0/3) 3 3 3 3 2   Language: Repeat phrase (0/2) 2 2 2 2 2   Language : Fluency (0/1) 1 1 1 1 1   Abstraction (0/2) 2 2 2 2 2   Delayed Recall (0/5) 4 5 5 5 5   Orientation (0/6) 6 6 6 6 6   Total 29 30 28 30 29   Adjusted Score (based on education) - - - 30 -         2015, CD     Evella Kasal Gillyard is a caucasian, married and  meanwhile retired female . She is seen here as a referral from Dr. Katrinka Blazing for memory evaluation, characterized by verbal communication difficulties, subjective word finding difficulties. [name] has noted neologisms as well as paraphasic errors with increasing frequency over the last 6-7 years. I obtained an MRI of the brain ( 09-19-13 )  with and without contrast to evaluate her for possible vascular injuries or focal brain atrophies which returned entirely normal. Actually her brain looked much younger than her numeric age would normally suggest. There were no normal lesions,  all ventricles were normal in size,  nor atrophy no mass effect and no significant white matter disease.The normal MRI was followed by a PET scan,  Fluorbetapir uptake did not indicate Alzheimer's Tangles, these news were a great relief (Dr. Amil Amen , 11-05-13).The patient was placed on a higher dosed taper of Prednisone for the treatment of joint pain by her [facility] Rheumatologist , and she noted a mental clarity that she craved during therapy. Trochanteric and knee arthralgias and bursitis cleared temporarily and she felt the reduced pain allowed her to function mentally better as well as physically. Her sleep was better , 7.5 hours on the prednisone.  She has exchanged words again after  weaning off, told her daughter" to switch [name]'s "doctor" instead of "diaper ". She noted that she writes fluently and when reading the product , sees neologims and paraphrasic errors even than - word substitution . Could this be a primary progressive aphasia ? Could this be pain related, insomnia? She sleeps from 11 PM  to 5.30 AM-  right now with pain.   Interval history, 12-01-2015 Mrs. Kinnear's life has drastically changed with the unexpected passing of her Husband, [name], May 27 th 2016, of small cell lung cancer, identified only 3 month earlier. Her memory concerns are not dominating her life at this time.   I have the pleasure to see Mrs.  [name] today on 07/07/2016, we performed a Montral cognitive assessment test today which remains in normal range for this very educated and highly intelligent individual.She has found new joy and comfort in her life. She is involved in her granddaughter's upbringing and still supports her son., who is deaf and lives in her home. She has been treated for cervical degeneration related pain by Dr. Doroteo Bradford, and he started her on gabapentin, 300 mg.  She reports spells of confusion, she placed her eye glases in a ziplock box, started cleaning and left in mid procedure, surprised when she re- entered the room that all utensils were there.   If she took 100 mg tid, it may be possible to take 300 mg at night only? CYMBALTA and LYRICA were poorly tolerated.   02-15-2017, 77 year old patient just returned from her home state of [place], stayed with her daughter after a surgery, took care of the 2 years old granddaughter. In June her son moved finally out of her home, and in July she meet a man whom she likes a lot. She is reflecting a new optimism. Her cognitive function is still intact by [facility], but she recalled that Dr. Leonides Cave had found a mild impairment years ago. She feels better on gabapentin , since June has been more energetic. She reports hand and foot pain. She has degenerative joint disease- probably psoriatic arthritis - seronegative , her fingers are deformed. Arch and toe pain. Dr Frederik Schmidt at [facility] follows her.      Montreal Cognitive Assessment  02/16/2018 02/15/2017 07/07/2016 12/01/2015 03/12/2014  Visuospatial/ Executive (0/5) 5 5 3 5 5   Naming (0/3) 3 3 3 3 3   Attention: Read list of digits (0/2) 2 2 2 2 2   Attention: Read list of letters (0/1) 1 1 1 1 1   Attention: Serial 7 subtraction starting at 100 (0/3) 3 3 3 3 2   Language: Repeat phrase (0/2) 2 2 2 2 2   Language : Fluency (0/1) 1 1 1 1 1   Abstraction (0/2) 2 2 2 2 2   Delayed Recall (0/5) 4 5 5 5 5   Orientation (0/6) 6 6 6 6 6   Total 29  30 28 30 29   Adjusted Score (based on education) - - - 30 -     COMPLAINT: [name] describes how she has had difficulties recalling details from conversations, articles she may have read, perhaps even radio- broadcasts or information from books.  12-01-2015  CD She stated last year that to be effective in her conversations, she would need to recall and be able to quote the author and edition of a certain publication, as she used to.  She was uncomfortable going to [employer]  on a mission for [employer], as she could not get herself to retain the detail needed  to discuss.  She is painfully aware of this deficit , as her command of language and details used to be excellent.  Her husband became more concerned about her tendency to change words or the ending of words- especially when using longer words.  She gave two examples: In a cool room , she told her husband she was "happy to wear her purse". She has used the word "perspection " instead of "perspective". Using neologism and parapharasias.  Her daughter noted her to be quiet in conversations that she usually would contribute to. She is afraid to get stuck. She is unaware that she changed the words, until she notices the reaction of the listener.   Her rheumatologist/ gynecologist  diagnosed her with sero-negative psoriatic arthritis and adrenal insufficiency. 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 This was in 2007-8.   This has become a concern - she states this is not delayed recall , it's :" A word behind a wall and no way to get to it ".  Her paternal aunts had dementia, her mother had no dementia in advanced age (63) .  Her sonhas meanwhile moved to [place] and she spent the summer with her now 41-9 year old granddaughter.   01-05-2016 I am meeting with [name] today to discuss the results of her recent nerve conduction studies  and electromyography. Dr. Joycelyn Schmid  discovered abnormal spontaneous activity was 3+ positive sharp waves and fibrillations between the right C6 and C7 vertebrae as well as those muscles that are innervated between the right C4 and C5 exiting nerves. This involves the deltoid, the triceps, biceps and the flexor carpi radialis. The PT also explained to her that her trapezius is involved . We discussed today a referral to a physical medicine and rehab MD> I recommended Dr. Doroteo Bradford.   Review of Systems: Out of a complete 14 system review, the patient complains of only the following symptoms, and all other reviewed systems are negative.  Joint pain, post nasal drip, hoarseness, no laryngitis.   Social History   Socioeconomic History  . Marital status: Married    Spouse name: [name]  . Number of children: 2  . Years of education: Masters  . Highest education level: Not on file  Occupational History  . 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 Financial resource strain: Not on file  . Food insecurity:    Worry: Not on file    Inability: Not on file  . Transportation needs:    Medical: Not on file    Non-medical: Not on file  Tobacco Use  . Smoking status: Never Smoker  . Smokeless tobacco: Never Used  Substance and Sexual Activity  . Alcohol use: Yes    Alcohol/week: 0.0 standard drinks    Comment: 2 ounce red wine occ  . Drug use: No  . Sexual activity: Not on file  Lifestyle  . Physical activity:    Days per week: Not on file    Minutes per session: Not on file  . Stress: Not on file  Relationships  . Social connections:    Talks on phone: Not on file    Gets together: Not on file    Attends religious service: Not on file    Active member of club or organization: Not on file    Attends meetings of clubs or organizations: Not  on file    Relationship status: Not on file  . Intimate partner violence:    Fear of current or ex  partner: Not on file    Emotionally abused: Not on file    Physically abused: Not on file    Forced sexual activity: Not on file  Other Topics Concern  . Not on file  Social History Narrative   Patient is married [name]) and lives at home with her husband.   Patient has two children.   Patient is retired.   Patient has a Master's degree.   Patient drinks two cups of coffee daily.   Patient is right-handed.          Family History  Problem Relation Age of Onset  . Esophageal cancer Father   . Stomach cancer Father   . Hypertension Father   . Colon cancer Mother   . Breast cancer Mother   . Depression Mother   . Anxiety disorder Mother   . Heart attack Paternal Grandfather   . Diabetes Paternal Grandmother   . Liver cancer Maternal Grandmother   . Rectal cancer Neg Hx     Past Medical History:  Diagnosis Date  . Adrenal gland dysfunction (HCC)     dr Vincente Poli, coricare B now resolved  . Anemia    iron  . Arthritis    psoriatic  in hips oa in hands  . Blood transfusion without reported diagnosis 1978  . Cataract   . chronic headaches    histamine migraines, rarely occur  . Chronic kidney disease    hx bladder infections yrs ago  . Colon polyp yrs ago  . Dysrhythmia 1996 to 1999   wore holter monitor result of accidental fall, occasional irregular pulse  . Family history of adverse reaction to anesthesia    father had aspiration after anesthesia one time  . Gastric polyps   . GERD (gastroesophageal reflux disease)   . History of migraine headaches   . Hyperlipidemia   . Hypothyroidism   . Neuromuscular disease (HCC)    fibromyalgia  . Pneumonia 2005aspiration pneumonia   stopped when got pneumonia shot, also 2015 regular pneumonia  . PONV (postoperative nausea and vomiting)    patient wishes to have no anesthetics that could affect memory due to past memory issues, no cause found  . Psoriatic arthritis [facility])     Past Surgical History:  Procedure Laterality  Date  . ABDOMINAL HYSTERECTOMY     left ovary left and tube left  . APPENDECTOMY     with hysterectomy  . CARDIAC CATHETERIZATION    . CATARACT EXTRACTION, BILATERAL    . CHOLECYSTECTOMY    . COLONOSCOPY    . FINGER SURGERY     middle finger right hand  . INCONTINENCE SURGERY     bladder sling  . RECTOCELE REPAIR    . RHINOPLASTY    . SHOULDER ARTHROSCOPY WITH ROTATOR CUFF REPAIR AND SUBACROMIAL DECOMPRESSION Right 06/17/2015   Procedure: SHOULDER DIAGNOSTIC OPERATIVE ARTHROSCOPY WITH MINI-OPEN ROTATOR CUFF REPAIR AND SUBACROMIAL DECOMPRESSION;  Surgeon: Cammy Copa, MD;  Location: [facility] OR;  Service: Orthopedics;  Laterality: Right;  . TONSILLECTOMY  age 25   adenoids also  . TOTAL HIP ARTHROPLASTY Right 07/06/2017   Procedure: RIGHT TOTAL HIP ARTHROPLASTY ANTERIOR APPROACH;  Surgeon: Ollen Gross, MD;  Location: [facility] ORS;  Service: Orthopedics;  Laterality: Right;    Current Outpatient Medications  Medication Sig Dispense Refill  . butalbital-aspirin-caffeine (FIORINAL) 50-325-40 MG capsule Take 1  tablet by mouth every 4 (four) hours as needed for headache.    . CRESTOR 5 MG tablet Take 5 mg by mouth daily.     Marland Kitchen estradiol (CLIMARA) 0.1 mg/24hr patch Climara 0.1 mg/24 hr transdermal patch    . fluocinonide (LIDEX) 0.05 % external solution Apply 1 application topically 2 (two) times daily as needed (for psorasis.).     Marland Kitchen gabapentin (NEURONTIN) 100 MG capsule Take 2 capsules (200 mg total) by mouth at bedtime. 60 capsule 5  . hydroxychloroquine (PLAQUENIL) 200 MG tablet Take 150 mg by mouth daily.    . Magnesium Oxide 250 MG TABS Take 250-500 mg by mouth daily. 250 mg in the am and 500 mg in the pm    . progesterone (PROMETRIUM) 100 MG capsule Take 100 mg by mouth at bedtime.    . sulfaSALAzine (AZULFIDINE) 500 MG tablet Take 1,000-1,500 mg by mouth 2 (two) times daily. 1000 mg in the am and 1500 mg at night    . SYNTHROID 50 MCG tablet Take 50 mcg by mouth every morning.     .  TESTOSTERONE PROPIONATE IJ Place 1 application onto the skin 3 (three) times a week.    . traZODone (DESYREL) 50 MG tablet Take 50 mg by mouth at bedtime. 1/4 tablet at bedtime     No current facility-administered medications for this visit.     Allergies as of 02/16/2018 - Review Complete 02/16/2018  Allergen Reaction Noted  . Imitrex [sumatriptan] Nausea And Vomiting and Hypertension 03/10/2012  . Nsaids Other (See Comments) 03/12/2014  . Ciprofloxacin hcl  09/22/2017  . Keflex [cephalexin] Nausea And Vomiting 03/12/2014    Vitals: BP 109/64   Pulse 81   Ht 5' 5.5" (1.664 m)   Wt 132 lb (59.9 kg)   BMI 21.63 kg/m  Last Weight:  Wt Readings from Last 1 Encounters:  02/16/18 132 lb (59.9 kg)   Last Height:   Ht Readings from Last 1 Encounters:  02/16/18 5' 5.5" (1.664 m)    Physical exam:  General: The patient is awake, alert and appears not in acute distress. The patient is well groomed. Head: Normocephalic, atraumatic. Neck is supple. Mallampati 2 , neck circumference: 14, no TMJ, no nasal congestion.  Cardiovascular:  Regular rate and rhythm, without  murmurs or carotid bruit, and without distended neck veins. Respiratory: Lungs are clear to auscultation. Skin:  Without evidence of edema, or rash Trunk: this  patient has a normal posture.  Neurologic exam : The patient is awake and alert, oriented to place and time.   She has mild hand tremor. Swollen interphalangeal joints.    Family and patient subjective concerned about her memory :   [facility] Cognitive Assessment  02/16/2018 02/15/2017 07/07/2016 12/01/2015 03/12/2014  Visuospatial/ Executive (0/5) 5 5 3 5 5   Naming (0/3) 3 3 3 3 3   Attention: Read list of digits (0/2) 2 2 2 2 2   Attention: Read list of letters (0/1) 1 1 1 1 1   Attention: Serial 7 subtraction starting at 100 (0/3) 3 3 3 3 2   Language: Repeat phrase (0/2) 2 2 2 2 2   Language : Fluency (0/1) 1 1 1 1 1   Abstraction (0/2) 2 2 2 2 2   Delayed  Recall (0/5) 4 5 5 5 5   Orientation (0/6) 6 6 6 6 6   Total 29 30 28 30 29   Adjusted Score (based on education) - - - 30 -    No alexia, but not sure  about agraphia- she is needing to check her writing and prefers the compute over handwriting for this reason.   Notebook user, list maker to have memory crutches.  There is a normal attention span & concentration ability.  Eloquent Speech is fluent without dysarthria, dysphonia or aphasia. She subjectively feels still impaired in immediate recall and memory of spoken information.   Mood and affect are appropriate.  Pupils remain  equal and briskly reactive to light. No nystagmus. Normal smooth pursuit , frontal release signs not present. No rooting , Visual fields by finger perimetry are intact. Hearing to finger rub intact.  Facial sensation intact to fine touch. Facial motor strength is symmetric and tongue and uvula move midline.  Sensory deficits with numbness of the outer toes outer 3 toes bilaterally both feet occurring simultaneously.  This may be related to a lumbar degeneration.  Pain in lateral and frontal thigh and sciatica. Right leg . Lateral Cutaneus femoral nerve entrapment by surgery.   I was able to review a report for a cervical spine MRI.   Assessment:  After physical and neurologic examination, review of laboratory studies, imaging, neurophysiology testing and pre-existing records, assessment is  Not a semantic aphasia but word finding difficulties.  Cervical DDD, rheumatic psoriatric arthritis.  generalized pain- treated with gabapentin. 300 mg a day. Sometimes takes 2 -   Not experiencing drowsiness but confused while taking it - cognitive decline very concerning. Dr. Kirtland Bouchard reduced the gabapentin to 100 mg daily, pain returned and brain still 'foggy'.   Plan:  Treatment plan and additional workup :  Dr. August Saucer felt her neck degeneration is cause for her pain.  Dr Frederik Schmidt at [facility] will follow her rheumatological condition. I  encouraged her to try trazodone.     Melvyn Novas, MD  02-16-2018  CC: Dr  Garnette Scheuermann,

## 2018-02-22 ENCOUNTER — Telehealth: Payer: Self-pay | Admitting: Neurology

## 2018-02-22 NOTE — Telephone Encounter (Signed)
Pt scheduled a [facility] on 04/25/18 at 1:15pm with Dr. Arlington Calix

## 2018-02-22 NOTE — Telephone Encounter (Signed)
Noted  

## 2019-02-19 ENCOUNTER — Other Ambulatory Visit: Payer: Self-pay

## 2019-02-19 ENCOUNTER — Encounter: Payer: Self-pay | Admitting: Neurology

## 2019-02-19 ENCOUNTER — Ambulatory Visit (INDEPENDENT_AMBULATORY_CARE_PROVIDER_SITE_OTHER): Payer: Medicare Other | Admitting: Neurology

## 2019-02-19 VITALS — BP 119/61 | HR 64 | Temp 98.2°F | Ht 65.5 in | Wt 133.0 lb

## 2019-02-19 DIAGNOSIS — G8929 Other chronic pain: Secondary | ICD-10-CM

## 2019-02-19 DIAGNOSIS — G5721 Lesion of femoral nerve, right lower limb: Secondary | ICD-10-CM

## 2019-02-19 DIAGNOSIS — G4701 Insomnia due to medical condition: Secondary | ICD-10-CM

## 2019-02-19 MED ORDER — TRAZODONE HCL 50 MG PO TABS: 50.0000 mg | ORAL_TABLET | Freq: Every day | ORAL | 11 refills | Status: DC

## 2019-02-19 MED ORDER — GABAPENTIN 100 MG PO CAPS: 300.0000 mg | ORAL_CAPSULE | Freq: Every day | ORAL | 5 refills | Status: DC

## 2019-02-19 NOTE — Progress Notes (Signed)
[facility]  Provider:  Melvyn Novas, M D  Referring Provider: Merri Brunette, MD Primary Care Physician:  Merri Brunette, MD     02-19-2019, Rv with Gaylyn Lambert Shifflett, scheduled revisit for memory follow up. See MOA below.  Epworth score 7 points, FSS at 15/ 63 points.      02-16-2018, Rv for this established caucasian right handed female patient with a longstanding history of subjective memory concerns. Her serial MOCA test shows no deficits. She has pain from her hip replacement. She has chronic neck -shoulder pain- Dr Doroteo Bradford . This affects her sleep, and trazodone and gabapentin were prescribed to help. Amitriptyline left her irritable. I had asked her to use Gapapentin at night, helping a little not enough. She has sciatica and femoral nerve pain on the right leg.  Shoulder on the right is tense. She wakes once in the middle of the night after 3-4 hours of sleep, with a severe pain all over. If she sleeps again and then wakes in AM there is much less pain. Trazodone prescribed at 50 mg -she as not picked it up yet.      Geriatric depression score 0/ 15, Epworth score not obtained.    Montreal Cognitive Assessment  02/19/2019 02/16/2018 02/15/2017 07/07/2016 12/01/2015  Visuospatial/ Executive (0/5) Naming (0/3) Attention: Read list of digits (0/2) Attention: Read list of letters (0/1) Attention: Serial 7 subtraction starting at 100 (0/3) Language: Repeat phrase (0/2) Language : Fluency (0/1) Abstraction (0/2) Delayed Recall (0/5) Orientation (0/6) Total Adjusted Score (based on education) - - - - 30         2015, CD  History of Present Illness:     Seanne Chirico Tumblin is a caucasian, married and meanwhile retired female . She is seen here as a referral from Dr. Katrinka Blazing for memory evaluation, characterized by verbal  communication difficulties, subjective word finding difficulties. [name] has noted neologisms as well as paraphasic errors with increasing frequency over the last 6-7 years. I obtained an MRI of the brain ( 09-19-13 )  with and without contrast to evaluate her for possible vascular injuries or focal brain atrophies which returned entirely normal. Actually her brain looked much younger than her numeric age would normally suggest. There were no normal lesions,  all ventricles were normal in size,  nor atrophy no mass effect and no significant white matter disease.The normal MRI was followed by a PET scan,  Fluorbetapir uptake did not indicate Alzheimer's Tangles, these news were a great relief (Dr. Amil Amen , 11-05-13).The patient was placed on a higher dosed taper of Prednisone for the treatment of joint pain by her [facility] Rheumatologist , and she noted a mental clarity that she craved during therapy. Trochanteric and knee arthralgias and bursitis cleared temporarily and she felt the reduced pain allowed her to function mentally better as well as physically. Her sleep was better , 7.5 hours on the prednisone.  She has exchanged words again after weaning off, told her daughter" to switch [name]'s "doctor" instead of "diaper ". She noted that she writes fluently and when reading the product ,  sees neologims and paraphrasic errors even than - word substitution . Could this be a primary progressive aphasia ? Could this be pain related, insomnia? She sleeps from 11 PM  to 5.30 AM-  right now with pain.   Interval history, 12-01-2015 Mrs. Ambler's life has drastically changed with the unexpected passing of her Husband, [name], May 27 th 2016, of small cell lung cancer, identified only 3 month earlier. Her memory concerns are not dominating her life at this time.   I have the pleasure to see [name] today on 07/07/2016, we performed a Montral cognitive assessment test today which remains in normal range for  this very educated and highly intelligent individual.She has found new joy and comfort in her life. She is involved in her granddaughter's upbringing and still supports her son., who is deaf and lives in her home. She has been treated for cervical degeneration related pain by Dr. Doroteo Bradford, and he started her on gabapentin, 300 mg.  She reports spells of confusion, she placed her eye glases in a ziplock box, started cleaning and left in mid procedure, surprised when she re- entered the room that all utensils were there.   If she took 100 mg tid, it may be possible to take 300 mg at night only? CYMBALTA and LYRICA were poorly tolerated.   02-15-2017, 78 year old patient just returned from her home state of [place], stayed with her daughter after a surgery, took care of the 65 years old granddaughter. In June her son moved finally out of her home, and in July she meet a man whom she likes a lot. She is reflecting a new optimism. Her cognitive function is still intact by [facility], but she recalled that Dr. Leonides Cave had found a mild impairment years ago. She feels better on gabapentin , since June has been more energetic. She reports hand and foot pain. She has degenerative joint disease- probably psoriatic arthritis - seronegative , her fingers are deformed. Arch and toe pain. Dr Frederik Schmidt at [facility] follows her.      Montreal Cognitive Assessment  02/19/2019 02/16/2018 02/15/2017 07/07/2016 12/01/2015  Visuospatial/ Executive (0/5) Naming (0/3) Attention: Read list of digits (0/2) Attention: Read list of letters (0/1) Attention: Serial 7 subtraction starting at 100 (0/3) Language: Repeat phrase (0/2) Language : Fluency (0/1) Abstraction (0/2) Delayed Recall (0/5) Orientation (0/6) Total Adjusted Score (based on education) - - - - 30     COMPLAINT: [name] describes how she has had  difficulties recalling details from conversations, articles she may have read, perhaps even radio- broadcasts or information from books.  12-01-2015  CD She stated last year that to be effective in her conversations, she would need to recall and be able to quote the author and edition of a certain publication, as she used to.  She was uncomfortable going to [employer]  on a mission for [employer], as she could not get herself to retain the detail needed to discuss.  She is painfully aware of this deficit , as her command of language and details used to be excellent.  Her husband  became more concerned about her tendency to change words or the ending of words- especially when using longer words.  She gave two examples: In a cool room , she told her husband she was "happy to wear her purse". She has used the word "perspection " instead of "perspective". Using neologism and parapharasias.  Her daughter noted her to be quiet in conversations that she usually would contribute to. She is afraid to get stuck. She is unaware that she changed the words, until she notices the reaction of the listener.   Her rheumatologist/ gynecologist  diagnosed her with sero-negative psoriatic arthritis and adrenal insufficiency. 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 This was in 2007-8.   This has become a concern - she states this is not delayed recall , it's :" A word behind a wall and no way to get to it ".  Her paternal aunts had dementia, her mother had no dementia in advanced age (22) .  Her sonhas meanwhile moved to [place] and she spent the summer with her now 48-80 year old granddaughter.   01-05-2016 I am meeting with [name] today to discuss the results of her recent nerve conduction studies and electromyography. Dr. Joycelyn Schmid  discovered abnormal spontaneous activity was 3+ positive sharp waves and  fibrillations between the right C6 and C7 vertebrae as well as those muscles that are innervated between the right C4 and C5 exiting nerves. This involves the deltoid, the triceps, biceps and the flexor carpi radialis. The PT also explained to her that her trapezius is involved . We discussed today a referral to a physical medicine and rehab MD> I recommended Dr. Doroteo Bradford.   Review of Systems: Out of a complete 14 system review, the patient complains of only the following symptoms, and all other reviewed systems are negative.  Joint pain, post nasal drip, hoarseness, no laryngitis.   She has rare spells of not being able to move her legs. , ot a feeling of magnetic gait, more not feeling stiff, but paralyzed. Helped by gabapentin, plaquenil. .   Social History   Socioeconomic History  . Marital status: Married    Spouse name: [name]  . Number of children: 2  . Years of education: Masters  . Highest education level: Not on file  Occupational History  . 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 Financial resource strain: Not on file  . Food insecurity    Worry: Not on file    Inability: Not on file  . Transportation needs    Medical: Not on file    Non-medical: Not on file  Tobacco Use  . Smoking status: Never Smoker  . Smokeless tobacco: Never Used  Substance and Sexual Activity  . Alcohol use: Yes    Alcohol/week: 0.0 standard drinks    Comment: 2 ounce red wine occ  . Drug use: No  . Sexual activity: Not on file  Lifestyle  . Physical activity    Days per week: Not on file    Minutes per session: Not on file  . Stress: Not on file  Relationships  . Social Musician on phone: Not on file    Gets together: Not on file    Attends religious service: Not on file    Active member of club or organization: Not on file  Attends meetings of clubs or organizations: Not on file    Relationship status: Not on file  . Intimate  partner violence    Fear of current or ex partner: Not on file    Emotionally abused: Not on file    Physically abused: Not on file    Forced sexual activity: Not on file  Other Topics Concern  . 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 .    Patient has two adult children from her first marriage with Frann Rider , [place].   Patient is retired.   Patient has a Master's degree.   Patient drinks two cups of coffee daily.   Patient is right-handed.   Patient is attending church, [employer].       Family History  Problem Relation Age of Onset  . Esophageal cancer Father   . Stomach cancer Father   . Hypertension Father   . Colon cancer Mother   . Breast cancer Mother   . Depression Mother   . Anxiety disorder Mother   . Heart attack Paternal Grandfather   . Diabetes Paternal Grandmother   . Liver cancer Maternal Grandmother   . Rectal cancer Neg Hx     Past Medical History:  Diagnosis Date  . Adrenal gland dysfunction ([place])     dr Helane Rima, coricare B now resolved  . Anemia    iron  . Arthritis    psoriatic  in hips oa in hands  . Blood transfusion without reported diagnosis 1978  . Cataract   . chronic headaches    histamine migraines, rarely occur  . Chronic kidney disease    hx bladder infections yrs ago  . Colon polyp yrs ago  . Dysrhythmia 1996 to 1999   wore holter monitor result of accidental fall, occasional irregular pulse  . Family history of adverse reaction to anesthesia    father had aspiration after anesthesia one time  . Gastric polyps   . GERD (gastroesophageal reflux disease)   . History of migraine headaches   . Hyperlipidemia   . Hypothyroidism   . Neuromuscular disease (HCC)    fibromyalgia  . Pneumonia 2005aspiration pneumonia   stopped when got pneumonia shot, also 2013-09-16 regular pneumonia  . PONV  (postoperative nausea and vomiting)    patient wishes to have no anesthetics that could affect memory due to past memory issues, no cause found  . Psoriatic arthritis [facility])     Past Surgical History:  Procedure Laterality Date  . ABDOMINAL HYSTERECTOMY     left ovary left and tube left  . APPENDECTOMY     with hysterectomy  . CARDIAC CATHETERIZATION    . CATARACT EXTRACTION, BILATERAL    . CHOLECYSTECTOMY    . COLONOSCOPY    . FINGER SURGERY     middle finger right hand  . INCONTINENCE SURGERY     bladder sling  . RECTOCELE REPAIR    . RHINOPLASTY    . SHOULDER ARTHROSCOPY WITH ROTATOR CUFF REPAIR AND SUBACROMIAL DECOMPRESSION Right 06/17/2015   Procedure: SHOULDER DIAGNOSTIC OPERATIVE ARTHROSCOPY WITH MINI-OPEN ROTATOR CUFF REPAIR AND SUBACROMIAL DECOMPRESSION;  Surgeon: Meredith Pel, MD;  Location: [place];  Service: Orthopedics;  Laterality: Right;  . TONSILLECTOMY  age 52   adenoids also  . TOTAL HIP ARTHROPLASTY Right 07/06/2017   Procedure: RIGHT TOTAL HIP ARTHROPLASTY  ANTERIOR APPROACH;  Surgeon: Ollen GrossAluisio, Frank, MD;  Location: [facility];  Service: Orthopedics;  Laterality: Right;    Current Outpatient Medications  Medication Sig Dispense Refill  . butalbital-aspirin-caffeine (FIORINAL) 50-325-40 MG capsule Take 1 tablet by mouth every 4 (four) hours as needed for headache.    . CRESTOR 5 MG tablet Take 5 mg by mouth daily.     Marland Kitchen. estradiol (CLIMARA) 0.1 mg/24hr patch Climara 0.1 mg/24 hr transdermal patch    . fluocinonide (LIDEX) 0.05 % external solution Apply 1 application topically 2 (two) times daily as needed (for psorasis.).     Marland Kitchen. gabapentin (NEURONTIN) 100 MG capsule Take 2 capsules (200 mg total) by mouth at bedtime. (Patient taking differently: Take 300 mg by mouth at bedtime. ) 60 capsule 5  . Magnesium Oxide 250 MG TABS Take 250-500 mg by mouth daily. 250 mg in the am and 500 mg in the pm    . Omega-3 Fatty Acids (FISH OIL PO) Take 2 capsules by mouth daily.    .  progesterone (PROMETRIUM) 100 MG capsule Take 100 mg by mouth at bedtime.    . sulfaSALAzine (AZULFIDINE) 500 MG tablet Take 1,000-1,500 mg by mouth 2 (two) times daily. 1000 mg in the am and 1500 mg at night    . SYNTHROID 50 MCG tablet Take 50 mcg by mouth every morning.     . TESTOSTERONE PROPIONATE IJ Place 1 application onto the skin 3 (three) times a week.    Marland Kitchen. VITAMIN D PO Take 1,000 mg by mouth daily.    . traZODone (DESYREL) 50 MG tablet Take 50 mg by mouth at bedtime. 1/4 tablet at bedtime     No current facility-administered medications for this visit.     Allergies as of 02/19/2019 - Review Complete 02/19/2019  Allergen Reaction Noted  . Imitrex [sumatriptan] Nausea And Vomiting and Hypertension 03/10/2012  . Nsaids Other (See Comments) 03/12/2014  . Ciprofloxacin hcl  09/22/2017  . Keflex [cephalexin] Nausea And Vomiting 03/12/2014    Vitals: BP 119/61   Pulse 64   Temp 98.2 F (36.8 C)   Ht 5' 5.5" (1.664 m)   Wt 133 lb (60.3 kg)   BMI 21.80 kg/m  Last Weight:  Wt Readings from Last 1 Encounters:  02/19/19 133 lb (60.3 kg)   Last Height:   Ht Readings from Last 1 Encounters:  02/19/19 5' 5.5" (1.664 m)    Physical exam:  General: The patient is awake, alert and appears not in acute distress. The patient is well groomed. Head: Normocephalic, atraumatic. Neck is supple. Mallampati 2 , neck circumference: 14, no TMJ, no nasal congestion.  Cardiovascular:  Regular rate and rhythm, without murmurs or carotid bruit, and without distended neck veins. Respiratory: Lungs are clear to auscultation. Skin:  Without evidence of edema, or rash Trunk: this  patient has a normal posture.  Neurologic exam : The patient is awake and alert, oriented to place and time.   She has mild hand tremor. Swollen interphalangeal joints.   Montreal Cognitive Assessment  02/19/2019 02/16/2018 02/15/2017 07/07/2016 12/01/2015  Visuospatial/ Executive (0/5) 5 5 5 3 5   Naming (0/3) 3 3 3 3  3   Attention: Read list of digits (0/2) 2 2 2 2 2   Attention: Read list of letters (0/1) 1 1 1 1 1   Attention: Serial 7 subtraction starting at 100 (0/3) 3 3 3 3 3   Language: Repeat phrase (0/2) 2 2 2 2 2   Language : Fluency (0/1)  1 1 1 1 1   Abstraction (0/2) 2 2 2 2 2   Delayed Recall (0/5) 5 4 5 5 5   Orientation (0/6) 6 6 6 6 6   Total 30 29 30 28 30   Adjusted Score (based on education) - - - - 30    No alexia, but not sure about agraphia- she is needing to check her writing and prefers the compute over handwriting for this reason.   Notebook user, list maker to have memory crutches.  There is a normal attention span & concentration ability.  Eloquent Speech is fluent without dysarthria, dysphonia or aphasia. She subjectively feels still impaired in immediate recall and memory of spoken information.  Mood and affect are appropriate.  Pupils remain  equal and briskly reactive to light. No nystagmus. Normal smooth pursuit , frontal release signs not present.  No rooting.  Visual fields by finger perimetry are intact. Hearing to finger rub intact.   Facial sensation intact to fine touch. Facial motor strength is symmetric and her tongue and uvula move midline.  Sensory deficits  That began with numbness of the  outer 3 toes bilaterally both feet occurring simultaneously.- now involving the whole planta pedis.  Assessment:  After physical and neurologic examination, review of laboratory studies, imaging, neurophysiology testing and pre-existing records, assessment is   Not a semantic aphasia but subjective word finding difficulties.  Cervical DDD, rheumatic psoriatric arthritis. Generalized pain- treated with gabapentin. 300 mg a day. Sometimes takes 2 -   Not experiencing drowsiness . No objective memory loss.   Plan:  Treatment plan and additional workup :  Dr. felt her neck degeneration is cause for her pain.  Dr at [facility] will continue follow her rheumatological  condition.  I encouraged her to try trazodone, melatonin at night.  [facility] records have been incongruent with her own experience-  I offered an explanation- her dream sleep may be interpreted as awake with faster heart rate.    , MD  02-16-2018  CC: Dr  August Saucer,  and Frederik Schmidt, MD

## 2019-02-20 ENCOUNTER — Encounter: Payer: Self-pay | Admitting: Neurology

## 2019-02-20 ENCOUNTER — Other Ambulatory Visit: Payer: Self-pay | Admitting: Neurology

## 2019-07-01 ENCOUNTER — Ambulatory Visit: Payer: Medicare Other

## 2019-07-16 ENCOUNTER — Ambulatory Visit: Payer: Medicare Other

## 2019-12-26 DIAGNOSIS — N398 Other specified disorders of urinary system: Secondary | ICD-10-CM | POA: Diagnosis not present

## 2019-12-26 DIAGNOSIS — M629 Disorder of muscle, unspecified: Secondary | ICD-10-CM | POA: Diagnosis not present

## 2019-12-26 DIAGNOSIS — N9489 Other specified conditions associated with female genital organs and menstrual cycle: Secondary | ICD-10-CM | POA: Diagnosis not present

## 2020-01-01 DIAGNOSIS — N9489 Other specified conditions associated with female genital organs and menstrual cycle: Secondary | ICD-10-CM | POA: Diagnosis not present

## 2020-01-01 DIAGNOSIS — N398 Other specified disorders of urinary system: Secondary | ICD-10-CM | POA: Diagnosis not present

## 2020-01-01 DIAGNOSIS — N3941 Urge incontinence: Secondary | ICD-10-CM | POA: Diagnosis not present

## 2020-02-18 ENCOUNTER — Encounter: Payer: Self-pay | Admitting: Neurology

## 2020-02-28 DIAGNOSIS — M7918 Myalgia, other site: Secondary | ICD-10-CM | POA: Diagnosis not present

## 2020-02-28 DIAGNOSIS — Z79899 Other long term (current) drug therapy: Secondary | ICD-10-CM | POA: Diagnosis not present

## 2020-02-28 DIAGNOSIS — Z96641 Presence of right artificial hip joint: Secondary | ICD-10-CM | POA: Diagnosis not present

## 2020-02-28 DIAGNOSIS — N3941 Urge incontinence: Secondary | ICD-10-CM | POA: Diagnosis not present

## 2020-02-28 DIAGNOSIS — N8184 Pelvic muscle wasting: Secondary | ICD-10-CM | POA: Diagnosis not present

## 2020-02-28 DIAGNOSIS — M1990 Unspecified osteoarthritis, unspecified site: Secondary | ICD-10-CM | POA: Diagnosis not present

## 2020-02-28 DIAGNOSIS — M8949 Other hypertrophic osteoarthropathy, multiple sites: Secondary | ICD-10-CM | POA: Diagnosis not present

## 2020-02-28 DIAGNOSIS — M1612 Unilateral primary osteoarthritis, left hip: Secondary | ICD-10-CM | POA: Diagnosis not present

## 2020-02-28 DIAGNOSIS — N398 Other specified disorders of urinary system: Secondary | ICD-10-CM | POA: Diagnosis not present

## 2020-02-28 DIAGNOSIS — M47816 Spondylosis without myelopathy or radiculopathy, lumbar region: Secondary | ICD-10-CM | POA: Diagnosis not present

## 2020-03-05 DIAGNOSIS — N398 Other specified disorders of urinary system: Secondary | ICD-10-CM | POA: Diagnosis not present

## 2020-03-05 DIAGNOSIS — N8184 Pelvic muscle wasting: Secondary | ICD-10-CM | POA: Diagnosis not present

## 2020-03-05 DIAGNOSIS — N3941 Urge incontinence: Secondary | ICD-10-CM | POA: Diagnosis not present

## 2020-03-12 DIAGNOSIS — N8184 Pelvic muscle wasting: Secondary | ICD-10-CM | POA: Diagnosis not present

## 2020-03-12 DIAGNOSIS — N398 Other specified disorders of urinary system: Secondary | ICD-10-CM | POA: Diagnosis not present

## 2020-03-12 DIAGNOSIS — N3941 Urge incontinence: Secondary | ICD-10-CM | POA: Diagnosis not present

## 2020-03-19 DIAGNOSIS — N8184 Pelvic muscle wasting: Secondary | ICD-10-CM | POA: Diagnosis not present

## 2020-03-19 DIAGNOSIS — N398 Other specified disorders of urinary system: Secondary | ICD-10-CM | POA: Diagnosis not present

## 2020-03-19 DIAGNOSIS — N3941 Urge incontinence: Secondary | ICD-10-CM | POA: Diagnosis not present

## 2020-03-26 DIAGNOSIS — N8184 Pelvic muscle wasting: Secondary | ICD-10-CM | POA: Diagnosis not present

## 2020-03-26 DIAGNOSIS — N3941 Urge incontinence: Secondary | ICD-10-CM | POA: Diagnosis not present

## 2020-03-26 DIAGNOSIS — N398 Other specified disorders of urinary system: Secondary | ICD-10-CM | POA: Diagnosis not present

## 2020-03-31 DIAGNOSIS — H524 Presbyopia: Secondary | ICD-10-CM | POA: Diagnosis not present

## 2020-03-31 DIAGNOSIS — H04123 Dry eye syndrome of bilateral lacrimal glands: Secondary | ICD-10-CM | POA: Diagnosis not present

## 2020-03-31 DIAGNOSIS — Z961 Presence of intraocular lens: Secondary | ICD-10-CM | POA: Diagnosis not present

## 2020-03-31 DIAGNOSIS — M456 Ankylosing spondylitis lumbar region: Secondary | ICD-10-CM | POA: Diagnosis not present

## 2020-03-31 DIAGNOSIS — Z79899 Other long term (current) drug therapy: Secondary | ICD-10-CM | POA: Diagnosis not present

## 2020-04-10 ENCOUNTER — Ambulatory Visit: Payer: Medicare PPO | Admitting: Neurology

## 2020-04-10 ENCOUNTER — Encounter: Payer: Self-pay | Admitting: Neurology

## 2020-04-10 ENCOUNTER — Other Ambulatory Visit: Payer: Self-pay | Admitting: Neurology

## 2020-04-10 VITALS — BP 138/72 | HR 68 | Ht 65.5 in | Wt 135.0 lb

## 2020-04-10 DIAGNOSIS — G629 Polyneuropathy, unspecified: Secondary | ICD-10-CM

## 2020-04-10 DIAGNOSIS — G5721 Lesion of femoral nerve, right lower limb: Secondary | ICD-10-CM

## 2020-04-10 DIAGNOSIS — M2550 Pain in unspecified joint: Secondary | ICD-10-CM

## 2020-04-10 NOTE — Addendum Note (Signed)
Addended by: Tamera Stands D on: 04/10/2020 03:35 PM   Modules accepted: Orders
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
## 2020-04-10 NOTE — Patient Instructions (Signed)
Neuropata perifrica Peripheral Neuropathy La neuropata perifrica es un tipo de lesin nerviosa. Afecta los nervios que transportan seales entre la mdula espinal y [place], las piernas y el resto del cuerpo (nervios perifricos). No afecta los nervios de la mdula espinal ni el cerebro. En los casos de neuropata perifrica, es posible que se dae un nervio o un grupo de nervios. La neuropata perifrica es una categora amplia que incluye muchos trastornos nerviosos especficos, como la neuropata diabtica, la neuropata hereditaria y el sndrome del tnel carpiano. Cules son las causas? Esta afeccin puede ser causada por lo siguiente:  Diabetes. Esta es la causa ms comn de la neuropata perifrica.  Lesiones en los nervios.  Presin o tensin duraderas en un nervio.  Falta (deficiencia) de vitaminasB. Puede ser a causa del alcoholismo, una mala alimentacin o una restriccin en la dieta.  Infecciones.  Enfermedades autoinmunitarias, como artritis reumatoide y lupus eritematoso sistmico.  Enfermedades de los nervios que se transmiten de padres a hijos (hereditarias).  Algunos medicamentos, por ejemplo, medicamentos contra el cncer (quimioterapia).  Sustancias venenosas (txicas), como el plomo y Manufacturing engineer.  Flujo deficiente de [employer].  Enfermedad renal.  Enfermedad tiroidea. En algunos casos, se desconoce la causa de este trastorno. Cules son los signos o los sntomas? Los sntomas de esta afeccin dependern de cules nervios se hayan daado. Entre los sntomas ms frecuentes, se incluyen los siguientes:  Prdida de la sensibilidad (adormecimiento) en los pies, las manos o ambos.  Hormigueo en los pies, las manos o ambos.  Dolor urente.  Piel muy sensible.  Debilidad.  Imposibilidad para mover partes del cuerpo (parlisis).  Fasciculaciones (temblores musculares).  Torpeza o coordinacin deficiente.  Prdida del  equilibrio.  Dificultad para controlar la vejiga.  Sensacin de [employer].  Problemas sexuales. Cmo se diagnostica? Puede resultar difcil diagnosticar la neuropata perifrica y Editor, commissioning su causa. El mdico revisar sus antecedentes mdicos y le har un examen fsico. Tambin se realizar un examen neurolgico. Esto involucra controlar aquello que se vea afectado por el cerebro, la mdula espinal y los nervios (sistema nervioso). Por ejemplo, el mdico controlar sus reflejos, cmo se mueve y su grado de sensibilidad. Tambin pueden hacerle otros estudios, por ejemplo:  Anlisis de [place].  Electromiografa(EMG) y estudios de conduccin nerviosa. Estos estudios controlan la actividad nerviosa y la eficacia con la que los nervios controlan los msculos.  Estudios de diagnstico por imgenes, como una exploracin por tomografa computarizada (TC) o una resonancia magntica (RM), para descartar otras causas de los sntomas.  Extraccin de una pequea porcin de nervio para examinarla en un laboratorio (biopsia del nervio). Esto es poco frecuente.  Extraccin y evaluacin de una pequea cantidad del lquido que rodea el cerebro y la mdula espinal (puncin lumbar). Esto es poco frecuente. Cmo se trata? El tratamiento de esta afeccin puede incluir lo siguiente:  Tratamiento de la causa preexistente de la neuropata, como la diabetes, una enfermedad renal o la deficiencia de vitaminas.  Interrupcin de los [employer] pueden causar la neuropata, como la quimioterapia.  Medicamentos para Engineer, materials. Entre los medicamentos se [employer] siguientes: ? Analgsicos de venta libre o recetados. ? Medicamentos anticonvulsivos. ? Antidepresivos. ? Parches analgsicos que se colocan en las zonas de la piel que le duelen.  Ciruga para Technical sales engineer presin en un nervio o para eliminar un nervio que est provocando dolor.  Fisioterapia para ayudar a Production manager y el  equilibrio.  Dispositivos para ayudarlo a  movilizarse (dispositivos de ayuda). Siga estas indicaciones en su casa: Medicamentos  Tome los medicamentos de venta libre y los recetados solamente como se lo haya indicado el mdico. No tome otros medicamentos sin consultar con su mdico primero.  No conduzca ni use maquinaria pesada mientras toma analgsicos recetados. Estilo de vida   No consuma ningn producto que contenga nicotina o tabaco, como cigarrillos y cigarrillos electrnicos. Fumar evita que la sangre llegue a los nervios daados. Si necesita ayuda para dejar de fumar, consulte al mdico.  Evite o limite el consumo alcohol. El exceso de alcohol puede causar una deficiencia de vitaminaB, y esta es necesaria para conservar el buen estado de los nervios.  Consuma una dieta saludable. Esto incluye lo siguiente: ? Consuma alimentos ricos en fibra, como frutas y verduras frescas, cereales integrales y frijoles. ? Limite el consumo de alimentos ricos en grasas y azcares procesados, como los alimentos fritos o dulces. Instrucciones generales   Si tiene diabetes, trabaje estrechamente con el mdico para mantener bajo control el nivel de azcar en la sangre.  Si siente adormecimiento en los pies, haga lo siguiente: ? Realice controles todos los das para detectar signos de lesin o infeccin. Observe seales de enrojecimiento, calor e hinchazn. ? Use calcetines acolchados y zapatos cmodos. Estos ayudan a proteger los pies.  Desarrolle un buen sistema de apoyo. Vivir con neuropata perifrica puede ser estresante. Considere la posibilidad de hablar con un especialista en salud mental o de unirse a un grupo de ayuda.  Use los dispositivos de ayuda y asista a fisioterapia como se lo haya indicado el mdico. Esto podra incluir el uso de un andador o un bastn.  Concurra a todas las visitas de control como se lo haya indicado el mdico. Esto es importante. Comunquese con un mdico  si:  Advierte signos o sntomas nuevos de la neuropata perifrica.  Le est costando lidiar con la neuropata perifrica desde el punto de vista emocional.  El dolor no est bajo control. Solicite ayuda de inmediato si:  Tiene una lesin o una infeccin que no se cura con normalidad.  Comienza a tener debilidad en un brazo o una pierna.  Se cae con frecuencia. Resumen  La neuropata perifrica es una afeccin en la que los nervios de los brazos o las piernas se daan, lo que provoca adormecimiento, debilidad o dolor.  Existen muchas causas de la neuropata perifrica; entre ellas, diabetes, compresin de algn nervio, deficiencia de vitaminas, enfermedad autoinmunitaria y afecciones hereditarias.  Puede resultar difcil diagnosticar la neuropata perifrica y hallar su causa. El mdico revisar sus antecedentes mdicos y le realizar un examen fsico y algunos estudios, como anlisis de sangre y estudios de la actividad nerviosa.  El tratamiento implica tratar la causa preexistente de la neuropata y usar medicamentos para controlar el dolor. La fisioterapia y los dispositivos de ayuda tambin podran ser tiles. Esta informacin no tiene como fin reemplazar el consejo del mdico. Asegrese de hacerle al mdico cualquier pregunta que tenga. Document Revised: 02/02/2017 Document Reviewed: 02/02/2017 Elsevier Patient Education  2020 Elsevier Inc.  

## 2020-04-11 DIAGNOSIS — Z0389 Encounter for observation for other suspected diseases and conditions ruled out: Secondary | ICD-10-CM | POA: Diagnosis not present

## 2020-04-11 DIAGNOSIS — Z1231 Encounter for screening mammogram for malignant neoplasm of breast: Secondary | ICD-10-CM | POA: Diagnosis not present

## 2020-04-11 LAB — PAN-ANCA
Atypical pANCA: <1:20 {titer}
C-ANCA: <1:20 {titer}

## 2020-04-11 NOTE — Progress Notes (Signed)
ANCA negative

## 2020-04-11 NOTE — Progress Notes (Signed)
Positive ANA, but differentiation is pending.

## 2020-04-13 LAB — PAN-ANCA: Myeloperoxidase Ab: <9 U/mL (ref 0.0–9.0)

## 2020-04-14 ENCOUNTER — Telehealth: Payer: Self-pay | Admitting: *Deleted

## 2020-04-14 LAB — PAN-ANCA
ANCA Proteinase 3: <3.5 U/mL (ref 0.0–3.5)
P-ANCA: <1:20 {titer}

## 2020-04-14 NOTE — Telephone Encounter (Signed)
I spoke to the patient and she verbalized understanding of the lab results. 

## 2020-04-14 NOTE — Telephone Encounter (Signed)
-----   Message from Melvyn Novas, MD sent at 04/11/2020  5:02 PM EST ----- ANCA negative

## 2020-04-14 NOTE — Telephone Encounter (Signed)
-----   Message from Carmen Dohmeier, MD sent at 04/11/2020  5:02 PM EST ----- °ANCA negative  °

## 2020-04-14 NOTE — Progress Notes (Signed)
ANA positive  with a subtype associated with scleroderma and ENA SSA ab. HLA B 27 still pending.  

## 2020-04-14 NOTE — Telephone Encounter (Signed)
-----   Message from Melvyn Novas, MD sent at 04/11/2020  1:38 PM EST ----- Positive ANA, but differentiation is pending.

## 2020-04-21 LAB — ANA W/REFLEX IF POSITIVE
Anti JO-1: <0.2 AI (ref 0.0–0.9)
Anti Nuclear Antibody (ANA): POSITIVE — AB
Centromere Ab Screen: <0.2 AI (ref 0.0–0.9)
Chromatin Ab SerPl-aCnc: <0.2 AI (ref 0.0–0.9)
ENA RNP Ab: <0.2 AI (ref 0.0–0.9)
ENA SM Ab Ser-aCnc: <0.2 AI (ref 0.0–0.9)
ENA SSA (RO) Ab: <0.2 AI (ref 0.0–0.9)
ENA SSB (LA) Ab: 1 AI — ABNORMAL HIGH (ref 0.0–0.9)
Scleroderma (Scl-70) (ENA) Antibody, IgG: 4.8 AI — ABNORMAL HIGH (ref 0.0–0.9)
dsDNA Ab: 4 IU/mL (ref 0–9)

## 2020-04-21 LAB — PROTEIN ELECTROPHORESIS, SERUM
A/G Ratio: 1.8 — ABNORMAL HIGH (ref 0.7–1.7)
Albumin ELP: 4.3 g/dL (ref 2.9–4.4)
Alpha 1: 0.3 g/dL (ref 0.0–0.4)
Alpha 2: 0.5 g/dL (ref 0.4–1.0)
Beta: 0.8 g/dL (ref 0.7–1.3)
Gamma Globulin: 0.9 g/dL (ref 0.4–1.8)
Globulin, Total: 2.4 g/dL (ref 2.2–3.9)
Total Protein: 6.7 g/dL (ref 6.0–8.5)

## 2020-04-21 LAB — HLA-B27 ANTIGEN: HLA B27: NEGATIVE

## 2020-04-22 NOTE — Progress Notes (Signed)
HLA b 27 is negative. ANCA were negative. Protein e-phoresis - no M protein spike ( good result) , slight disproportion of A/G ratio ( I am not concerned).  Leaves positive ANA.

## 2020-06-04 DIAGNOSIS — R768 Other specified abnormal immunological findings in serum: Secondary | ICD-10-CM | POA: Diagnosis not present

## 2020-06-04 DIAGNOSIS — M1380 Other specified arthritis, unspecified site: Secondary | ICD-10-CM | POA: Diagnosis not present

## 2020-06-04 DIAGNOSIS — Z79899 Other long term (current) drug therapy: Secondary | ICD-10-CM | POA: Diagnosis not present

## 2020-06-04 DIAGNOSIS — M8949 Other hypertrophic osteoarthropathy, multiple sites: Secondary | ICD-10-CM | POA: Diagnosis not present

## 2020-07-05 DIAGNOSIS — R350 Frequency of micturition: Secondary | ICD-10-CM | POA: Diagnosis not present

## 2020-07-05 DIAGNOSIS — N390 Urinary tract infection, site not specified: Secondary | ICD-10-CM | POA: Diagnosis not present

## 2020-09-02 DIAGNOSIS — L720 Epidermal cyst: Secondary | ICD-10-CM | POA: Diagnosis not present

## 2020-09-02 DIAGNOSIS — L821 Other seborrheic keratosis: Secondary | ICD-10-CM | POA: Diagnosis not present

## 2020-09-02 DIAGNOSIS — D225 Melanocytic nevi of trunk: Secondary | ICD-10-CM | POA: Diagnosis not present

## 2020-09-02 DIAGNOSIS — L814 Other melanin hyperpigmentation: Secondary | ICD-10-CM | POA: Diagnosis not present

## 2020-09-02 DIAGNOSIS — D0471 Carcinoma in situ of skin of right lower limb, including hip: Secondary | ICD-10-CM | POA: Diagnosis not present

## 2020-09-02 DIAGNOSIS — D2262 Melanocytic nevi of left upper limb, including shoulder: Secondary | ICD-10-CM | POA: Diagnosis not present

## 2020-09-02 DIAGNOSIS — D485 Neoplasm of uncertain behavior of skin: Secondary | ICD-10-CM | POA: Diagnosis not present

## 2020-09-02 DIAGNOSIS — L820 Inflamed seborrheic keratosis: Secondary | ICD-10-CM | POA: Diagnosis not present

## 2020-09-02 DIAGNOSIS — D2261 Melanocytic nevi of right upper limb, including shoulder: Secondary | ICD-10-CM | POA: Diagnosis not present

## 2020-09-10 DIAGNOSIS — M25552 Pain in left hip: Secondary | ICD-10-CM | POA: Diagnosis not present

## 2020-09-10 DIAGNOSIS — M5459 Other low back pain: Secondary | ICD-10-CM | POA: Diagnosis not present

## 2020-09-29 DIAGNOSIS — Z01419 Encounter for gynecological examination (general) (routine) without abnormal findings: Secondary | ICD-10-CM | POA: Diagnosis not present

## 2020-09-29 DIAGNOSIS — N959 Unspecified menopausal and perimenopausal disorder: Secondary | ICD-10-CM | POA: Diagnosis not present

## 2020-09-29 DIAGNOSIS — R0789 Other chest pain: Secondary | ICD-10-CM | POA: Diagnosis not present

## 2020-10-02 ENCOUNTER — Telehealth: Payer: Self-pay

## 2020-10-02 NOTE — Telephone Encounter (Signed)
NOTES ON FILE FROM MICHELLE GREWAL [phone] SENT REFERRAL TO SCHEDULING

## 2020-10-03 DIAGNOSIS — U071 COVID-19: Secondary | ICD-10-CM | POA: Diagnosis not present

## 2020-10-05 ENCOUNTER — Telehealth: Payer: Self-pay | Admitting: Physician Assistant

## 2020-10-05 DIAGNOSIS — U071 COVID-19: Secondary | ICD-10-CM

## 2020-10-05 MED ORDER — BENZONATATE 100 MG PO CAPS
100.0000 mg | ORAL_CAPSULE | Freq: Three times a day (TID) | ORAL | 0 refills | Status: DC | PRN
Start: 2020-10-05 — End: 2020-10-16

## 2020-10-05 MED ORDER — ALBUTEROL SULFATE HFA 108 (90 BASE) MCG/ACT IN AERS: 2.0000 | INHALATION_SPRAY | Freq: Four times a day (QID) | RESPIRATORY_TRACT | 0 refills | Status: DC | PRN

## 2020-10-05 NOTE — Progress Notes (Signed)
I have spent 5 minutes in review of e-visit questionnaire, review and updating patient chart, medical decision making and response to patient.   Garry Nicolini Cody Storie Heffern, PA-C    

## 2020-10-05 NOTE — Progress Notes (Signed)
E-Visit for Positive Covid Test Result We are sorry you are not feeling well. We are here to help!  You have tested positive for COVID-19, meaning that you were infected with the novel coronavirus and could give the virus to others.  It is vitally important that you stay home so you do not spread it to others.      Please continue isolation at home, for at least 10 days since the start of your symptoms and until you have had 24 hours with no fever (without taking a fever reducer) and with improving of symptoms.  If you have no symptoms but tested positive (or all symptoms resolve after 5 days and you have no fever) you can leave your house but continue to wear a mask around others for an additional 5 days. If you have a fever,continue to stay home until you have had 24 hours of no fever. Most cases improve 5-10 days from onset but we have seen a small number of patients who have gotten worse after the 10 days.  Please be sure to watch for worsening symptoms and remain taking the proper precautions.   Go to the nearest hospital ED for assessment if fever/cough/breathlessness are severe or illness seems like a threat to life.    The following symptoms may appear 2-14 days after exposure: . Fever . Cough . Shortness of breath or difficulty breathing . Chills . Repeated shaking with chills . Muscle pain . Headache . Sore throat . New loss of taste or smell . Fatigue . Congestion or runny nose . Nausea or vomiting . Diarrhea  You have been enrolled in [facility] Monitoring for COVID-19. Daily you will receive a questionnaire within the MyChart website. Our COVID-19 response team will be monitoring your responses daily. I have also sent your information to our COVID treatment team to review your chart and to reach out to you to discuss all the treatment options for you giving your health history and current medications (Antibody infusion, IV antivirals, oral antivirals, etc).  You can use  medication such as prescription cough medication called Tessalon Perles 100 mg. You may take 1-2 capsules every 8 hours as needed for cough and  prescription inhaler called Albuterol MDI 90 mcg /actuation 2 puffs every 4 hours as needed for shortness of breath, wheezing, cough  You may also take acetaminophen (Tylenol) as needed for fever.  HOME CARE: . Only take medications as instructed by your medical team. . Drink plenty of fluids and get plenty of rest. . A steam or ultrasonic humidifier can help if you have congestion.   GET HELP RIGHT AWAY IF YOU HAVE EMERGENCY WARNING SIGNS.  Call 911 or proceed to your closest emergency facility if: . You develop worsening high fever. . Trouble breathing . Bluish lips or face . Persistent pain or pressure in the chest . New confusion . Inability to wake or stay awake . You cough up blood. . Your symptoms become more severe . Inability to hold down food or fluids  This list is not all possible symptoms. Contact your medical provider for any symptoms that are severe or concerning to you.    Your e-visit answers were reviewed by a board certified advanced clinical practitioner to complete your personal care plan.  Depending on the condition, your plan could have included both over the counter or prescription medications.  If there is a problem please reply once you have received a response from your provider.  Your safety  is important to [place].  If you have drug allergies check your prescription carefully.    You can use MyChart to ask questions about today's visit, request a non-urgent call back, or ask for a work or school excuse for 24 hours related to this e-Visit. If it has been greater than 24 hours you will need to follow up with your provider, or enter a new e-Visit to address those concerns. You will get an e-mail in the next two days asking about your experience.  I hope that your e-visit has been valuable and will speed your recovery.  Thank you for using e-visits.

## 2020-10-06 ENCOUNTER — Other Ambulatory Visit: Payer: Self-pay | Admitting: Physician Assistant

## 2020-10-06 ENCOUNTER — Telehealth: Payer: Self-pay

## 2020-10-06 DIAGNOSIS — U071 COVID-19: Secondary | ICD-10-CM

## 2020-10-06 MED ORDER — NIRMATRELVIR/RITONAVIR (PAXLOVID)TABLET: 3.0000 | ORAL_TABLET | Freq: Two times a day (BID) | ORAL | 0 refills | Status: AC

## 2020-10-06 MED ORDER — HYDROXYCHLOROQUINE SULFATE 200 MG PO TABS: 300.0000 mg | ORAL_TABLET | Freq: Every day | ORAL | 2 refills | Status: DC

## 2020-10-06 NOTE — Telephone Encounter (Signed)
Called to discuss with patient about COVID-19 symptoms and the use of one of the available treatments for those with mild to moderate Covid symptoms and at a high risk of hospitalization.  Pt appears to qualify for outpatient treatment due to co-morbid conditions and/or a member of an at-risk group in accordance with the FDA Emergency Use Authorization.    Symptom onset: 10/03/20 Cough,fever,body aches,headache,sore throat Vaccinated: Yes Booster? Yes Immunocompromised?  Qualifiers: Possible psoriatic arthritis NIH Criteria: Tier 3  Pt. Would like to speak with APP.  Esther Hardy

## 2020-10-06 NOTE — Progress Notes (Signed)
Outpatient Oral COVID Treatment Note  I connected with [name] on 10/06/2020/12:22 PM by telephone and verified that I am speaking with the correct person using two identifiers.  I discussed the limitations, risks, security, and privacy concerns of performing an evaluation and management service by telephone and the availability of in person appointments. I also discussed with the patient that there may be a patient responsible charge related to this service. The patient expressed understanding and agreed to proceed.  Patient location: home  Provider location: office   Diagnosis: COVID-19 infection  Purpose of visit: Discussion of potential use of Molnupiravir or Paxlovid, a new treatment for mild to moderate COVID-19 viral infection in non-hospitalized patients.   Subjective: Patient is a 80 y.o. female who has been diagnosed with COVID 19 viral infection.  Their symptoms began on 5/12 with cough and cold and fever.    Past Medical History:  Diagnosis Date  . Adrenal gland dysfunction (HCC)     dr Vincente Poli, coricare B now resolved  . Anemia    iron  . Arthritis    psoriatic  in hips oa in hands  . Blood transfusion without reported diagnosis 1978  . Cataract   . chronic headaches    histamine migraines, rarely occur  . Chronic kidney disease    hx bladder infections yrs ago  . Colon polyp yrs ago  . Dysrhythmia 1996 to 1999   wore holter monitor result of accidental fall, occasional irregular pulse  . Family history of adverse reaction to anesthesia    father had aspiration after anesthesia one time  . Gastric polyps   . GERD (gastroesophageal reflux disease)   . History of migraine headaches   . Hyperlipidemia   . Hypothyroidism   . Neuromuscular disease (HCC)    fibromyalgia  . Pneumonia 2005aspiration pneumonia   stopped when got pneumonia shot, also 2015 regular pneumonia  . PONV (postoperative nausea and vomiting)    patient wishes to have no anesthetics that  could affect memory due to past memory issues, no cause found  . Psoriatic arthritis (HCC)     Allergies  Allergen Reactions  . Imitrex [Sumatriptan] Nausea And Vomiting and Hypertension  . Nsaids Other (See Comments)    Genella Rife as result taking.  . Ciprofloxacin Hcl   . Keflex [Cephalexin] Nausea And Vomiting     Current Outpatient Medications:  .  hydroxychloroquine (PLAQUENIL) 200 MG tablet, Take 1.5 tablets (300 mg total) by mouth daily., Disp: 60 tablet, Rfl: 2 .  albuterol (VENTOLIN HFA) 108 (90 Base) MCG/ACT inhaler, Inhale 2 puffs into the lungs every 6 (six) hours as needed for wheezing or shortness of breath., Disp: 8 g, Rfl: 0 .  benzonatate (TESSALON) 100 MG capsule, Take 1 capsule (100 mg total) by mouth 3 (three) times daily as needed for cough., Disp: 30 capsule, Rfl: 0 .  butalbital-aspirin-caffeine (FIORINAL) 50-325-40 MG capsule, Take 1 tablet by mouth every 4 (four) hours as needed for headache., Disp: , Rfl:  .  CRESTOR 5 MG tablet, Take 5 mg by mouth daily. , Disp: , Rfl:  .  estradiol (CLIMARA) 0.1 mg/24hr patch, Climara 0.1 mg/24 hr transdermal patch, Disp: , Rfl:  .  fluocinonide (LIDEX) 0.05 % external solution, Apply 1 application topically 2 (two) times daily as needed (for psorasis.). , Disp: , Rfl:  .  gabapentin (NEURONTIN) 100 MG capsule, Take 3 capsules (300 mg total) by mouth at bedtime., Disp: 90 capsule, Rfl: 5 .  Magnesium  Oxide 250 MG TABS, Take 250-500 mg by mouth daily. 250 mg in the am and 500 mg in the pm, Disp: , Rfl:  .  Omega-3 Fatty Acids (FISH OIL PO), Take 2 capsules by mouth daily., Disp: , Rfl:  .  progesterone (PROMETRIUM) 100 MG capsule, Take 100 mg by mouth at bedtime., Disp: , Rfl:  .  sulfaSALAzine (AZULFIDINE) 500 MG tablet, Take 1,000-1,500 mg by mouth 2 (two) times daily. 1000 mg in the am and 1500 mg at night, Disp: , Rfl:  .  SYNTHROID 50 MCG tablet, Take 50 mcg by mouth every morning. , Disp: , Rfl:  .  TESTOSTERONE PROPIONATE IJ,  Place 1 application onto the skin 3 (three) times a week., Disp: , Rfl:  .  VITAMIN D PO, Take 1,000 mg by mouth daily., Disp: , Rfl:   Objective: Patient sound okay on the phone.  They are in no apparent distress.  Breathing is non labored.  Mood and behavior are normal.  Laboratory Data:  No results found for this or any previous visit (from the past 2160 hour(s)).   Assessment: 80 y.o. female with mild/moderate COVID 19 viral infection diagnosed on 5/14 at high risk for progression to severe COVID 19.  Plan:  This patient is a 80 y.o. female that meets the following criteria for Emergency Use Authorization of: Paxlovid 1. Age >12 yr AND > 40 kg 2. SARS-COV-2 positive test 3. Symptom onset < 5 days 4. Mild-to-moderate COVID disease with high risk for severe progression to hospitalization or death  I have spoken and communicated the following to the patient or parent/caregiver regarding: 1. Paxlovid is an unapproved drug that is authorized for use under an Emergency Use Authorization.  2. There are no adequate, approved, available products for the treatment of COVID-19 in adults who have mild-to-moderate COVID-19 and are at high risk for progressing to severe COVID-19, including hospitalization or death. 3. Other therapeutics are currently authorized. For additional information on all products authorized for treatment or prevention of COVID-19, please see [email or web address]-[email or web address]  4. There are benefits and risks of taking this treatment as outlined in the "Fact Sheet for Patients and Caregivers."  5. "Fact Sheet for Patients and Caregivers" was reviewed with patient. A hard copy will be provided to patient from pharmacy prior to the patient receiving treatment. 6. Patients should continue to self-isolate and use infection control measures (e.g., wear mask, isolate, social distance, avoid  sharing personal items, clean and disinfect "high touch" surfaces, and frequent handwashing) according to CDC guidelines.  7. The patient or parent/caregiver has the option to accept or refuse treatment. 8. Patient medication history was reviewed for potential drug interactions:Interaction with home meds: Crestor  9. Patient's GFR was calculated to be 84, and they were therefore prescribed Normal dose (GFR>60) - nirmatrelvir 150mg  tab (2 tablet) by mouth twice daily AND ritonavir 100mg  tab (1 tablet) by mouth twice daily. Lab work in from in 05/2020   After reviewing above information with the patient, the patient agrees to receive Paxlovid.  Follow up instructions:    . Take prescription BID x 5 days as directed . Reach out to pharmacist for counseling on medication if desired . For concerns regarding further COVID symptoms please follow up with your PCP or urgent care . For urgent or life-threatening issues, seek care at your local emergency department  The patient was provided an opportunity to ask questions, and all were answered. The patient agreed with the plan  and demonstrated an understanding of the instructions.   Script sent to [facility] and opted to pick up RX.  The patient was advised to call their PCP or seek an in-person evaluation if the symptoms worsen or if the condition fails to improve as anticipated.   I provided 15 minutes of non face-to-face telephone visit time during this encounter, and > 50% was spent counseling as documented under my assessment & plan.  Cline Crock, PA-C 10/06/2020 /12:22 PM

## 2020-10-07 ENCOUNTER — Emergency Department (HOSPITAL_COMMUNITY)
Admission: EM | Admit: 2020-10-07 | Discharge: 2020-10-07 | Disposition: A | Discharge status: Home / Self Care | Payer: Medicare PPO | Attending: Emergency Medicine | Admitting: Emergency Medicine

## 2020-10-07 ENCOUNTER — Telehealth: Payer: Self-pay | Admitting: *Deleted

## 2020-10-07 ENCOUNTER — Emergency Department (HOSPITAL_COMMUNITY): Payer: Medicare PPO

## 2020-10-07 ENCOUNTER — Encounter (INDEPENDENT_AMBULATORY_CARE_PROVIDER_SITE_OTHER): Payer: Self-pay

## 2020-10-07 ENCOUNTER — Other Ambulatory Visit: Payer: Self-pay

## 2020-10-07 ENCOUNTER — Encounter (HOSPITAL_COMMUNITY): Payer: Self-pay

## 2020-10-07 DIAGNOSIS — Z79899 Other long term (current) drug therapy: Secondary | ICD-10-CM | POA: Diagnosis not present

## 2020-10-07 DIAGNOSIS — N189 Chronic kidney disease, unspecified: Secondary | ICD-10-CM | POA: Insufficient documentation

## 2020-10-07 DIAGNOSIS — R0602 Shortness of breath: Secondary | ICD-10-CM | POA: Diagnosis not present

## 2020-10-07 DIAGNOSIS — U071 COVID-19: Secondary | ICD-10-CM | POA: Diagnosis not present

## 2020-10-07 DIAGNOSIS — E039 Hypothyroidism, unspecified: Secondary | ICD-10-CM | POA: Diagnosis not present

## 2020-10-07 DIAGNOSIS — Z96641 Presence of right artificial hip joint: Secondary | ICD-10-CM | POA: Insufficient documentation

## 2020-10-07 DIAGNOSIS — R7612 Nonspecific reaction to cell mediated immunity measurement of gamma interferon antigen response without active tuberculosis: Secondary | ICD-10-CM | POA: Diagnosis not present

## 2020-10-07 LAB — CBC WITH DIFFERENTIAL/PLATELET
Abs Immature Granulocytes: 0.01 10*3/uL (ref 0.00–0.07)
Basophils Absolute: 0 10*3/uL (ref 0.0–0.1)
Basophils Relative: 0 %
Eosinophils Absolute: 0 10*3/uL (ref 0.0–0.5)
Eosinophils Relative: 0 %
HCT: 38.5 % (ref 36.0–46.0)
Hemoglobin: 12.6 g/dL (ref 12.0–15.0)
Immature Granulocytes: 0 %
Lymphocytes Relative: 11 %
Lymphs Abs: 0.6 10*3/uL — ABNORMAL LOW (ref 0.7–4.0)
MCH: 33.5 pg (ref 26.0–34.0)
MCHC: 32.7 g/dL (ref 30.0–36.0)
MCV: 102.4 fL — ABNORMAL HIGH (ref 80.0–100.0)
Monocytes Absolute: 0.4 10*3/uL (ref 0.1–1.0)
Monocytes Relative: 6 %
Neutro Abs: 4.6 10*3/uL (ref 1.7–7.7)
Neutrophils Relative %: 83 %
Platelets: 107 10*3/uL — ABNORMAL LOW (ref 150–400)
RBC: 3.76 MIL/uL — ABNORMAL LOW (ref 3.87–5.11)
RDW: 12.5 % (ref 11.5–15.5)
WBC: 5.6 10*3/uL (ref 4.0–10.5)
nRBC: 0 % (ref 0.0–0.2)

## 2020-10-07 LAB — BASIC METABOLIC PANEL
Anion gap: 6 (ref 5–15)
BUN: 13 mg/dL (ref 8–23)
CO2: 26 mmol/L (ref 22–32)
Calcium: 8.6 mg/dL — ABNORMAL LOW (ref 8.9–10.3)
Chloride: 107 mmol/L (ref 98–111)
Creatinine, Ser: 0.61 mg/dL (ref 0.44–1.00)
GFR, Estimated: >60 mL/min (ref >60)
Glucose, Bld: 126 mg/dL — ABNORMAL HIGH (ref 70–99)
Potassium: 3.5 mmol/L (ref 3.5–5.1)
Sodium: 139 mmol/L (ref 135–145)

## 2020-10-07 LAB — TROPONIN I (HIGH SENSITIVITY): Troponin I (High Sensitivity): 6 ng/L (ref <18)

## 2020-10-07 NOTE — Discharge Instructions (Addendum)
Take Tylenol or Motrin as needed for fevers and pain.  Take Paxil with as previously prescribed.  Follow-up with your primary care doctor or the COVID treatment center.  If you develop chest pain, difficulty breathing or other new concerning symptom, return to ER for reassessment.  Follow isolation precautions per CDC guidelines.

## 2020-10-07 NOTE — ED Triage Notes (Signed)
Pt presents to ED with covid symptoms. Tested positive Fri 5/13. Low grade fever 100.3 Fri/Sat, states baseline temp is around 97. Endorses cough,  SHOB with exertion, HA. [place] called in paxlovid, took dose last night. Hx pneumonia, on sulfasalazine and hydroxychloroquine. [facility] recommended presentation to ED today.

## 2020-10-07 NOTE — ED Notes (Signed)
Per patient engagement RN-states patient with worsening covid symptoms-SOB and cough

## 2020-10-07 NOTE — Telephone Encounter (Signed)
BPA triggered for worsening cough, SOB, change in appetite.  States since yesterday afternoon, increasing SOB with minimal exertion. Reports severe headache, increased cough last night, "Brownish yellow, little better this AM." Also reports temp last night 100.3, this AM 99.5. States "Feels like I have to breath through my mouth to get a good breathe." Directed to ED. States will go to [facility]. Called charge nurse Misty Stanley to alert of arrival.    Pt prescribed Paxlovid, took first dose last night.

## 2020-10-08 ENCOUNTER — Ambulatory Visit: Payer: Medicare PPO | Admitting: Neurology

## 2020-10-08 NOTE — ED Provider Notes (Signed)
[facility]-EMERGENCY DEPT Provider Note   CSN: [id number] Arrival date & time: 10/07/20  6010     History Chief Complaint  Patient presents with  . Shortness of Breath    [name] is a 80 y.o. female.  Presents to ER with concern for COVID symptoms.  Patient reports that she was recently diagnosed with COVID-19.  Initially experiencing generally mild symptoms including cough, low-grade temperature, malaise.  She was prescribed Paxil did and started taking this.  She reports last night that she felt like all of her symptoms significantly increased.  She felt somewhat short of breath with minimal exertion, also had a bad headache.  Had bad coughing spells.  No blood in cough.  Today her symptoms are more mild again.  She does not feel short of breath at present. HA currently mild, frontal. No vision, speech, numbness, tingling, no neck pain. She discussed with the COVID patient treatment nurse this morning who recommended she come to ER for further evaluation due to her reported worsening of symptoms.  No associated chest pain.  HPI     Past Medical History:  Diagnosis Date  . Adrenal gland dysfunction (HCC)     dr Vincente Poli, coricare B now resolved  . Anemia    iron  . Arthritis    psoriatic  in hips oa in hands  . Blood transfusion without reported diagnosis 1978  . Cataract   . chronic headaches    histamine migraines, rarely occur  . Chronic kidney disease    hx bladder infections yrs ago  . Colon polyp yrs ago  . Dysrhythmia 1996 to 1999   wore holter monitor result of accidental fall, occasional irregular pulse  . Family history of adverse reaction to anesthesia    father had aspiration after anesthesia one time  . Gastric polyps   . GERD (gastroesophageal reflux disease)   . History of migraine headaches   . Hyperlipidemia   . Hypothyroidism   . Neuromuscular disease (HCC)    fibromyalgia  . Pneumonia 2005aspiration pneumonia   stopped  when got pneumonia shot, also 2015 regular pneumonia  . PONV (postoperative nausea and vomiting)    patient wishes to have no anesthetics that could affect memory due to past memory issues, no cause found  . Psoriatic arthritis [facility])     Patient Active Problem List   Diagnosis Date Noted  . Femoral nerve lesion, right 02/16/2018  . Insomnia secondary to chronic pain 02/16/2018  . OA (osteoarthritis) of hip 07/06/2017  . Cervical myofascial pain syndrome 07/27/2016  . Fibromyalgia syndrome 07/27/2016  . Cervical disc disorder with radiculopathy of cervical region 03/16/2016  . Cervical spondylosis without myelopathy 03/16/2016  . Contracture of joint of right shoulder region 03/16/2016  . DDD (degenerative disc disease), cervical 12/01/2015  . MCI (mild cognitive impairment) 12/01/2015  . Polyarthralgia 11/08/2013  . Memory difficulties 11/08/2013  . Associative aphasia 11/08/2013  . Adrenal gland dysfunction (HCC)   . Memory disturbance 09/12/2013  . DYSPEPSIA 06/10/2009    Past Surgical History:  Procedure Laterality Date  . ABDOMINAL HYSTERECTOMY     left ovary left and tube left  . APPENDECTOMY     with hysterectomy  . CARDIAC CATHETERIZATION    . CATARACT EXTRACTION, BILATERAL    . CHOLECYSTECTOMY    . COLONOSCOPY    . FINGER SURGERY     middle finger right hand  . INCONTINENCE SURGERY     bladder sling  . RECTOCELE  REPAIR    . RHINOPLASTY    . SHOULDER ARTHROSCOPY WITH ROTATOR CUFF REPAIR AND SUBACROMIAL DECOMPRESSION Right 06/17/2015   Procedure: SHOULDER DIAGNOSTIC OPERATIVE ARTHROSCOPY WITH MINI-OPEN ROTATOR CUFF REPAIR AND SUBACROMIAL DECOMPRESSION;  Surgeon: Cammy Copa, MD;  Location: [facility] OR;  Service: Orthopedics;  Laterality: Right;  . TONSILLECTOMY  age 29   adenoids also  . TOTAL HIP ARTHROPLASTY Right 07/06/2017   Procedure: RIGHT TOTAL HIP ARTHROPLASTY ANTERIOR APPROACH;  Surgeon: Ollen Gross, MD;  Location: [facility] ORS;  Service: Orthopedics;   Laterality: Right;     OB History   No obstetric history on file.     Family History  Problem Relation Age of Onset  . Esophageal cancer Father   . Stomach cancer Father   . Hypertension Father   . Colon cancer Mother   . Breast cancer Mother   . Depression Mother   . Anxiety disorder Mother   . Heart attack Paternal Grandfather   . Diabetes Paternal Grandmother   . Liver cancer Maternal Grandmother   . Rectal cancer Neg Hx     Social History   Tobacco Use  . Smoking status: Never Smoker  . Smokeless tobacco: Never Used  Vaping Use  . Vaping Use: Never used  Substance Use Topics  . Alcohol use: Yes    Alcohol/week: 0.0 standard drinks    Comment: 2 ounce red wine occ  . Drug use: No    Home Medications Prior to Admission medications   Medication Sig Start Date End Date Taking? Authorizing Provider  albuterol (VENTOLIN HFA) 108 (90 Base) MCG/ACT inhaler Inhale 2 puffs into the lungs every 6 (six) hours as needed for wheezing or shortness of breath. 10/05/20   Waldon Merl, PA-C  benzonatate (TESSALON) 100 MG capsule Take 1 capsule (100 mg total) by mouth 3 (three) times daily as needed for cough. 10/05/20   Waldon Merl, PA-C  butalbital-aspirin-caffeine [facility]) [phone] MG capsule Take 1 tablet by mouth every 4 (four) hours as needed for headache.    [provider]  CRESTOR 5 MG tablet Take 5 mg by mouth daily.  03/02/12   [provider]  estradiol (CLIMARA) 0.1 mg/24hr patch Climara 0.1 mg/24 hr transdermal patch    [provider]  fluocinonide (LIDEX) 0.05 % external solution Apply 1 application topically 2 (two) times daily as needed (for psorasis.).     [provider]  gabapentin (NEURONTIN) 100 MG capsule Take 3 capsules (300 mg total) by mouth at bedtime. 02/19/19   Dohmeier, Porfirio Mylar, MD  hydroxychloroquine (PLAQUENIL) 200 MG tablet Take 1.5 tablets (300 mg total) by mouth daily. 10/06/20 10/06/21  Janetta Hora, PA-C  Magnesium Oxide 250 MG TABS Take 250-500 mg by mouth daily. 250 mg in the am and 500 mg in the pm    [provider]  nirmatrelvir/ritonavir EUA (PAXLOVID) TABS Take 3 tablets by mouth 2 (two) times daily for 5 days. Take nirmatrelvir (150 mg) two tablet(s) twice daily for 5 days and ritonavir (100 mg) one tablet twice daily for 5 days. 10/06/20 10/11/20  Janetta Hora, PA-C  Omega-3 Fatty Acids (FISH OIL PO) Take 2 capsules by mouth daily.    [provider]  progesterone (PROMETRIUM) 100 MG capsule Take 100 mg by mouth at bedtime.    [provider]  sulfaSALAzine (AZULFIDINE) 500 MG tablet Take 1,000-1,500 mg by mouth 2 (two) times daily. 1000 mg in the am and 1500 mg at night  [provider]  SYNTHROID 50 MCG tablet Take 50 mcg by mouth every morning.  02/22/12   [provider]  TESTOSTERONE PROPIONATE IJ Place 1 application onto the skin 3 (three) times a week.    [provider]  VITAMIN D PO Take 1,000 mg by mouth daily.    [provider]    Allergies    Imitrex [sumatriptan], Nsaids, Ciprofloxacin hcl, and Keflex [cephalexin]  Review of Systems   Review of Systems  Constitutional: Positive for chills and fatigue. Negative for fever.  HENT: Negative for ear pain and sore throat.   Eyes: Negative for pain and visual disturbance.  Respiratory: Positive for cough and shortness of breath.   Cardiovascular: Negative for chest pain and palpitations.  Gastrointestinal: Negative for abdominal pain and vomiting.  Genitourinary: Negative for dysuria and hematuria.  Musculoskeletal: Positive for myalgias. Negative for arthralgias and back pain.  Skin: Negative for color change and rash.  Neurological: Positive for headaches. Negative for seizures and syncope.  All other systems reviewed and are negative.   Physical Exam Updated Vital Signs BP (!) 117/97 (BP Location: Left Arm)   Pulse 86   Temp 98.7 F (37.1  C) (Oral)   Resp 16   Ht 5\' 5"  (1.651 m)   Wt 59 kg   SpO2 98%   BMI 21.63 kg/m   Physical Exam Vitals and nursing note reviewed.  Constitutional:      General: She is not in acute distress.    Appearance: She is well-developed.  HENT:     Head: Normocephalic and atraumatic.  Eyes:     Conjunctiva/sclera: Conjunctivae normal.  Cardiovascular:     Rate and Rhythm: Normal rate and regular rhythm.     Heart sounds: No murmur heard.   Pulmonary:     Effort: Pulmonary effort is normal. No respiratory distress.     Breath sounds: Normal breath sounds.  Abdominal:     Palpations: Abdomen is soft.     Tenderness: There is no abdominal tenderness.  Musculoskeletal:     Cervical back: Neck supple.  Skin:    General: Skin is warm and dry.  Neurological:     General: No focal deficit present.     Mental Status: She is alert.     ED Results / Procedures / Treatments   Labs (all labs ordered are listed, but only abnormal results are displayed) Labs Reviewed  CBC WITH DIFFERENTIAL/PLATELET - Abnormal; Notable for the following components:      Result Value   RBC 3.76 (*)    MCV 102.4 (*)    Platelets 107 (*)    Lymphs Abs 0.6 (*)    All other components within normal limits  BASIC METABOLIC PANEL - Abnormal; Notable for the following components:   Glucose, Bld 126 (*)    Calcium 8.6 (*)    All other components within normal limits  TROPONIN I (HIGH SENSITIVITY)  TROPONIN I (HIGH SENSITIVITY)    EKG EKG Interpretation  Date/Time:  Tuesday Oct 07 2020 10:13:29 EDT Ventricular Rate:  76 PR Interval:  150 QRS Duration: 92 QT Interval:  392 QTC Calculation: 441 R Axis:   44 Text Interpretation: Sinus rhythm LVH with secondary repolarization abnormality Anterior Q waves, possibly due to LVH Confirmed by Marianna Fussykstra, Yan Okray ([id number]) on 10/07/2020 10:56:42 AM   Radiology DG Chest Portable 1 View  Result Date: 10/07/2020 CLINICAL DATA:  COVID 19 infection. EXAM: PORTABLE  CHEST 1 VIEW COMPARISON:  Chest x-ray  dated Sep 22, 2017. FINDINGS: The heart size and mediastinal contours are within normal limits. Both lungs are clear. The visualized skeletal structures are unremarkable. IMPRESSION: No active disease. Electronically Signed   By: Obie Dredge M.D.   On: 10/07/2020 11:05    Procedures Procedures   Medications Ordered in ED Medications - No data to display  ED Course  I have reviewed the triage vital signs and the nursing notes.  Pertinent labs & imaging results that were available during my care of the patient were reviewed by me and considered in my medical decision making (see chart for details).    MDM Rules/Calculators/A&P                         80 year old lady presents to ER with concern for worsening COVID symptoms.  She reports feeling more short of breath and having increased headache and body aches.  Currently however, patient states her symptoms have somewhat improved.  She appears remarkably well-appearing on exam.  She has normal vital signs.  No tachypnea or hypoxia.  Lungs are clear.  Chest x-ray negative.  Basic labs stable.  EKG without acute ischemic change and troponin within normal limits.  Suspect her symptoms are all from COVID.  She has already been prescribed Paxlovid.  Given her current clinical appearance but she is appropriate for discharge and outpatient management.  Recommended following up with primary doctor or call the clinic.  After the discussed management above, the patient was determined to be safe for discharge.  The patient was in agreement with this plan and all questions regarding their care were answered.  ED return precautions were discussed and the patient will return to the ED with any significant worsening of condition.   Final Clinical Impression(s) / ED Diagnoses Final diagnoses:  COVID-19    Rx / DC Orders ED Discharge Orders    None       Milagros Loll, MD 10/08/20 2111

## 2020-10-10 ENCOUNTER — Telehealth: Payer: Self-pay

## 2020-10-10 NOTE — Telephone Encounter (Signed)
Pt. Reports her cough is worse today on questionnaire. Reports congestion is "looser, but better now as the morning has gone on." "I don't feel worse though." Will call her PCP as needed.Will continue plan of care.

## 2020-10-14 ENCOUNTER — Ambulatory Visit (INDEPENDENT_AMBULATORY_CARE_PROVIDER_SITE_OTHER): Payer: Medicare PPO | Admitting: Nurse Practitioner

## 2020-10-14 ENCOUNTER — Other Ambulatory Visit: Payer: Self-pay

## 2020-10-14 VITALS — BP 110/66 | HR 86 | Temp 97.9°F | Resp 18

## 2020-10-14 DIAGNOSIS — Z8616 Personal history of COVID-19: Secondary | ICD-10-CM | POA: Insufficient documentation

## 2020-10-14 MED ORDER — TIZANIDINE HCL 4 MG PO TABS: 4.0000 mg | ORAL_TABLET | Freq: Three times a day (TID) | ORAL | 0 refills | Status: DC | PRN

## 2020-10-14 MED ORDER — AZITHROMYCIN 250 MG PO TABS: ORAL_TABLET | ORAL | 0 refills | Status: AC

## 2020-10-14 MED ORDER — PREDNISONE 20 MG PO TABS: 20.0000 mg | ORAL_TABLET | Freq: Every day | ORAL | 0 refills | Status: AC

## 2020-10-14 NOTE — Progress Notes (Signed)
@Patient  ID: , female    DOB: [birth year], 80 y.o.   MRN: [id number]  Chief Complaint  Patient presents with  . history of covid    Referring provider: [id number], MD  HPI  Patient presents today for post-COVID care clinic visit.  Patient tested positive for COVID on May 13.  She was seen in the ED on 10/07/2020.  Chest x-ray was clear.  Labs were stable and EKG was stable.  She was prescribed Paxil COVID.  Patient complains today of left-sided facial pain.  She does complain of increased sinus congestion pressure and pain on that side. Denies f/c/s, n/v/d, hemoptysis, PND, chest pain or edema.      Allergies  Allergen Reactions  . Imitrex [Sumatriptan] Nausea And Vomiting and Hypertension  . Nsaids Other (See Comments)    10/09/2020 as result taking.  . Ciprofloxacin Hcl   . Keflex [Cephalexin] Nausea And Vomiting     There is no immunization history on file for this patient.  Past Medical History:  Diagnosis Date  . Adrenal gland dysfunction (HCC)     dr Genella Rife, coricare B now resolved  . Anemia    iron  . Arthritis    psoriatic  in hips oa in hands  . Blood transfusion without reported diagnosis 1978  . Cataract   . chronic headaches    histamine migraines, rarely occur  . Chronic kidney disease    hx bladder infections yrs ago  . Colon polyp yrs ago  . Dysrhythmia 1996 to 1999   wore holter monitor result of accidental fall, occasional irregular pulse  . Family history of adverse reaction to anesthesia    father had aspiration after anesthesia one time  . Gastric polyps   . GERD (gastroesophageal reflux disease)   . History of migraine headaches   . Hyperlipidemia   . Hypothyroidism   . Neuromuscular disease (HCC)    fibromyalgia  . Pneumonia 2005aspiration pneumonia   stopped when got pneumonia shot, also 2015 regular pneumonia  . PONV (postoperative nausea and vomiting)    patient wishes to have no anesthetics that could affect memory due  to past memory issues, no cause found  . Psoriatic arthritis (HCC)     Tobacco History: Social History   Tobacco Use  Smoking Status Never Smoker  Smokeless Tobacco Never Used   Counseling given: Yes   Outpatient Encounter Medications as of 10/14/2020  Medication Sig  . [EXPIRED] azithromycin (ZITHROMAX) 250 MG tablet Take 2 tablets on day 1, then 1 tablet daily on days 2 through 5  . [EXPIRED] predniSONE (DELTASONE) 20 MG tablet Take 1 tablet (20 mg total) by mouth daily with breakfast for 5 days.  . [DISCONTINUED] tiZANidine (ZANAFLEX) 4 MG tablet Take 1 tablet (4 mg total) by mouth every 8 (eight) hours as needed for up to 10 days for muscle spasms.  10/16/2020 albuterol (VENTOLIN HFA) 108 (90 Base) MCG/ACT inhaler Inhale 2 puffs into the lungs every 6 (six) hours as needed for wheezing or shortness of breath.  . butalbital-aspirin-caffeine (FIORINAL) 50-325-40 MG capsule Take 1 tablet by mouth every 4 (four) hours as needed for headache.  . CRESTOR 5 MG tablet Take 5 mg by mouth daily.   Marland Kitchen estradiol (CLIMARA - DOSED IN MG/24 HR) 0.1 mg/24hr patch Climara 0.1 mg/24 hr transdermal patch  . fluocinonide (LIDEX) 0.05 % external solution Apply 1 application topically 2 (two) times daily as needed (for psorasis.).   Marland Kitchen gabapentin (NEURONTIN) 100 MG  capsule Take 3 capsules (300 mg total) by mouth at bedtime.  . hydroxychloroquine (PLAQUENIL) 200 MG tablet Take 1.5 tablets (300 mg total) by mouth daily.  . Magnesium Oxide 250 MG TABS Take 250-500 mg by mouth daily. 250 mg in the am and 500 mg in the pm  . progesterone (PROMETRIUM) 100 MG capsule Take 100 mg by mouth at bedtime.  . sulfaSALAzine (AZULFIDINE) 500 MG tablet Take 1,000-1,500 mg by mouth 2 (two) times daily. 1000 mg in the am and 1500 mg at night  . SYNTHROID 50 MCG tablet Take 50 mcg by mouth every morning.   . TESTOSTERONE PROPIONATE IJ Place 1 application onto the skin 3 (three) times a week.  Marland Kitchen VITAMIN D PO Take 1,000 mg by mouth  daily.  . [DISCONTINUED] benzonatate (TESSALON) 100 MG capsule Take 1 capsule (100 mg total) by mouth 3 (three) times daily as needed for cough.  . [DISCONTINUED] Omega-3 Fatty Acids (FISH OIL PO) Take 2 capsules by mouth daily.   No facility-administered encounter medications on file as of 10/14/2020.     Review of Systems  Review of Systems  Constitutional: Negative.   HENT: Positive for sinus pressure and sinus pain.   Respiratory: Negative for cough and shortness of breath.   Cardiovascular: Negative.  Negative for chest pain, palpitations and leg swelling.  Gastrointestinal: Negative.   Allergic/Immunologic: Negative.   Neurological: Negative.   Psychiatric/Behavioral: Negative.        Physical Exam  BP 110/66   Pulse 86   Temp 97.9 F (36.6 C)   Resp 18   SpO2 100%   Wt Readings from Last 5 Encounters:  10/16/20 133 lb 12.8 oz (60.7 kg)  10/07/20 130 lb (59 kg)  04/10/20 135 lb (61.2 kg)  02/19/19 133 lb (60.3 kg)  02/16/18 132 lb (59.9 kg)     Physical Exam Vitals and nursing note reviewed.  Constitutional:      General: She is not in acute distress.    Appearance: She is well-developed.  Cardiovascular:     Rate and Rhythm: Normal rate and regular rhythm.  Pulmonary:     Effort: Pulmonary effort is normal.     Breath sounds: Normal breath sounds.  Neurological:     Mental Status: She is alert and oriented to person, place, and time.        Assessment & Plan:   History of COVID-19 Right side facial pain with sinus pressure and pain:   Stay well hydrated  Stay active  Deep breathing exercises  May take tylenol or fever or pain  May take mucinex DM twice daily  Will order azithromycin  Will order prednisone  Will order tizanidine as needed    Follow up:  Follow up in 2 weeks or sooner if needed        Ivonne Andrew, NP 10/21/2020

## 2020-10-14 NOTE — Assessment & Plan Note (Signed)
Right side facial pain with sinus pressure and pain:   Stay well hydrated  Stay active  Deep breathing exercises  May take tylenol or fever or pain  May take mucinex DM twice daily  Will order azithromycin  Will order prednisone  Will order tizanidine as needed    Follow up:  Follow up in 2 weeks or sooner if needed

## 2020-10-14 NOTE — Patient Instructions (Addendum)
Covid 19 Right side facial pain with sinus pressure and pain:   Stay well hydrated  Stay active  Deep breathing exercises  May take tylenol or fever or pain  May take mucinex DM twice daily  Will order azithromycin  Will order prednisone  Will order tizanidine as needed    Follow up:  Follow up in 2 weeks or sooner if needed

## 2020-10-16 ENCOUNTER — Ambulatory Visit (INDEPENDENT_AMBULATORY_CARE_PROVIDER_SITE_OTHER): Payer: Medicare PPO | Admitting: Cardiovascular Disease

## 2020-10-16 ENCOUNTER — Encounter: Payer: Self-pay | Admitting: Cardiovascular Disease

## 2020-10-16 ENCOUNTER — Other Ambulatory Visit: Payer: Self-pay

## 2020-10-16 DIAGNOSIS — R0600 Dyspnea, unspecified: Secondary | ICD-10-CM | POA: Diagnosis not present

## 2020-10-16 DIAGNOSIS — E785 Hyperlipidemia, unspecified: Secondary | ICD-10-CM | POA: Insufficient documentation

## 2020-10-16 DIAGNOSIS — E782 Mixed hyperlipidemia: Secondary | ICD-10-CM | POA: Diagnosis not present

## 2020-10-16 DIAGNOSIS — R0609 Other forms of dyspnea: Secondary | ICD-10-CM | POA: Insufficient documentation

## 2020-10-16 NOTE — Assessment & Plan Note (Signed)
History of hyperlipidemia on Crestor with lipid profile performed 10/11/2019 revealing total cholesterol 181, LDL of 93 and HDL of 77.

## 2020-10-16 NOTE — Patient Instructions (Signed)
Medication Instructions:  Your physician recommends that you continue on your current medications as directed. Please refer to the Current Medication list given to you today.  *If you need a refill on your cardiac medications before your next appointment, please call your pharmacy*   Testing/Procedures: Your physician has requested that you have an echocardiogram. Echocardiography is a painless test that uses sound waves to create images of your heart. It provides your doctor with information about the size and shape of your heart and how well your heart's chambers and valves are working. This procedure takes approximately one hour. There are no restrictions for this procedure. This procedure is done at [name].   Your physician has requested that you have a lexiscan myoview. For further information please visit [email or web address] Please follow instruction sheet, as given. This will take place at [address] [facility], [address]  How to prepare for your Myocardial Perfusion Test:  Do not eat or drink 3 hours prior to your test, except you may have water.  Do not consume products containing caffeine (regular or decaffeinated) 12 hours prior to your test. (ex: coffee, chocolate, sodas, tea).  Do bring a list of your current medications with you.  If not listed below, you may take your medications as normal.  Do wear comfortable clothes (no dresses or overalls) and walking shoes, tennis shoes preferred (No heels or open toe shoes are allowed).  Do NOT wear cologne, perfume, aftershave, or lotions (deodorant is allowed).  The test will take approximately 3 to 4 hours to complete  If these instructions are not followed, your test will have to be rescheduled.     Follow-Up: At [facility], you and your health needs are our priority.  As part of our continuing mission to provide you with exceptional heart care, we have created designated Provider Care Teams.  These  Care Teams include your primary Cardiologist (physician) and Advanced Practice Providers (APPs -  Physician Assistants and Nurse Practitioners) who all work together to provide you with the care you need, when you need it.  We recommend signing up for the patient portal called "MyChart".  Sign up information is provided on this After Visit Summary.  MyChart is used to connect with patients for Virtual Visits (Telemedicine).  Patients are able to view lab/test results, encounter notes, upcoming appointments, etc.  Non-urgent messages can be sent to your provider as well.   To learn more about what you can do with MyChart, go to [email or web address].    Your next appointment:   6 week(s)  The format for your next appointment:   In Person  Provider:   Nanetta Batty, MD

## 2020-10-16 NOTE — Assessment & Plan Note (Signed)
6 months history of dyspnea on exertion.  No prior tobacco exposure.  Patient denies chest pain.  I am going to get a 2D echo and a Lexiscan Myoview to further evaluate.

## 2020-10-16 NOTE — Progress Notes (Signed)
10/16/2020 Kissa Campoy Hinote   1940/06/15  [id number]  Primary Physician Merri Brunette, MD Primary Cardiologist: [name] MD Nicholes Calamity, [place]  HPI:  [name] is a 80 y.o. 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 Vincente Poli , her OB/GYN, for progressive dyspnea.  Her only risk factor history of hyperlipidemia.  She is never smoked.  There is no family history.  She is never had a heart attack or stroke.  She is had a right total hip replacement and has difficulties with her left hip scheduled to see Dr. Lequita Halt in the near future.  Since January he has had progressive dyspnea.  She did recently have COVID and was treated with Paxlovid as an outpatient.   Current Meds  Medication Sig  . albuterol (VENTOLIN HFA) 108 (90 Base) MCG/ACT inhaler Inhale 2 puffs into the lungs every 6 (six) hours as needed for wheezing or shortness of breath.  Marland Kitchen azithromycin (ZITHROMAX) 250 MG tablet Take 2 tablets on day 1, then 1 tablet daily on days 2 through 5  . butalbital-aspirin-caffeine (FIORINAL) 50-325-40 MG capsule Take 1 tablet by mouth every 4 (four) hours as needed for headache.  . CRESTOR 5 MG tablet Take 5 mg by mouth daily.   Marland Kitchen estradiol (CLIMARA - DOSED IN MG/24 HR) 0.1 mg/24hr patch Climara 0.1 mg/24 hr transdermal patch  . fluocinonide (LIDEX) 0.05 % external solution Apply 1 application topically 2 (two) times daily as needed (for psorasis.).   Marland Kitchen gabapentin (NEURONTIN) 100 MG capsule Take 3 capsules (300 mg total) by mouth at bedtime.  . hydroxychloroquine (PLAQUENIL) 200 MG tablet Take 1.5 tablets (300 mg total) by mouth daily.  . Magnesium Oxide 250 MG TABS Take 250-500 mg by mouth daily. 250 mg in the am and 500 mg in the pm  . predniSONE (DELTASONE) 20 MG tablet Take 1 tablet (20 mg total) by mouth daily with breakfast for 5 days.  .  progesterone (PROMETRIUM) 100 MG capsule Take 100 mg by mouth at bedtime.  . sulfaSALAzine (AZULFIDINE) 500 MG tablet Take 1,000-1,500 mg by mouth 2 (two) times daily. 1000 mg in the am and 1500 mg at night  . SYNTHROID 50 MCG tablet Take 50 mcg by mouth every morning.   . TESTOSTERONE PROPIONATE IJ Place 1 application onto the skin 3 (three) times a week.  Marland Kitchen VITAMIN D PO Take 1,000 mg by mouth daily.     Allergies  Allergen Reactions  . Imitrex [Sumatriptan] Nausea And Vomiting and Hypertension  . Nsaids Other (See Comments)    Genella Rife as result taking.  . Ciprofloxacin Hcl   . Keflex [Cephalexin] Nausea And Vomiting    Social History   Socioeconomic History  . Marital status: Married    Spouse name: [name]  . Number of children: 2  . Years of education: Masters  . Highest education level: Not on file  Occupational History  . 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 Smoking status: Never Smoker  . Smokeless tobacco: Never Used  Vaping Use  . Vaping Use: Never used  Substance and Sexual Activity  . Alcohol use: Yes    Alcohol/week: 0.0 standard drinks    Comment: 2 ounce red wine occ  . Drug use: No  . Sexual activity: Not on file  Other Topics Concern  .  Not on file  Social History Narrative   Patient is married [name]) and lives at home with her husband.   Patient has two children.   Patient is retired.   Patient has a Master's degree.   Patient drinks two cups of coffee daily.   Patient is right-handed.         Social Determinants of Health   Financial Resource Strain: Not on file  Food Insecurity: Not on file  Transportation Needs: Not on file  Physical Activity: Not on file  Stress: Not on file  Social Connections: Not on file  Intimate Partner Violence: Not on file     Review of Systems: General: negative for chills, fever, night sweats or weight changes.  Cardiovascular: negative for chest pain, dyspnea on  exertion, edema, orthopnea, palpitations, paroxysmal nocturnal dyspnea or shortness of breath Dermatological: negative for rash Respiratory: negative for cough or wheezing Urologic: negative for hematuria Abdominal: negative for nausea, vomiting, diarrhea, bright red blood per rectum, melena, or hematemesis Neurologic: negative for visual changes, syncope, or dizziness All other systems reviewed and are otherwise negative except as noted above.    Blood pressure 132/76, pulse 87, height 5' 4.5" (1.638 m), weight 133 lb 12.8 oz (60.7 kg), SpO2 98 %.  General appearance: alert and no distress Neck: no adenopathy, no carotid bruit, no JVD, supple, symmetrical, trachea midline and thyroid not enlarged, symmetric, no tenderness/mass/nodules Lungs: clear to auscultation bilaterally Heart: regular rate and rhythm, S1, S2 normal, no murmur, click, rub or gallop Extremities: extremities normal, atraumatic, no cyanosis or edema Pulses: 2+ and symmetric Skin: Skin color, texture, turgor normal. No rashes or lesions Neurologic: Alert and oriented X 3, normal strength and tone. Normal symmetric reflexes. Normal coordination and gait  EKG sinus rhythm 87 with a short run of nonsustained ventricular tachycardia.  I personally reviewed this EKG.  ASSESSMENT AND PLAN:   Hyperlipidemia History of hyperlipidemia on Crestor with lipid profile performed 10/11/2019 revealing total cholesterol 181, LDL of 93 and HDL of 77.  Dyspnea on exertion 6 months history of dyspnea on exertion.  No prior tobacco exposure.  Patient denies chest pain.  I am going to get a 2D echo and a Lexiscan Myoview to further evaluate.      [name] MD FACP,FACC,FAHA, [facility], [facility] 10/16/2020 2:18 PM

## 2020-10-27 DIAGNOSIS — E039 Hypothyroidism, unspecified: Secondary | ICD-10-CM | POA: Diagnosis not present

## 2020-10-27 DIAGNOSIS — Z0000 Encounter for general adult medical examination without abnormal findings: Secondary | ICD-10-CM | POA: Diagnosis not present

## 2020-10-27 DIAGNOSIS — M4690 Unspecified inflammatory spondylopathy, site unspecified: Secondary | ICD-10-CM | POA: Diagnosis not present

## 2020-10-27 DIAGNOSIS — Z1389 Encounter for screening for other disorder: Secondary | ICD-10-CM | POA: Diagnosis not present

## 2020-10-27 DIAGNOSIS — E7800 Pure hypercholesterolemia, unspecified: Secondary | ICD-10-CM | POA: Diagnosis not present

## 2020-10-27 DIAGNOSIS — Z1159 Encounter for screening for other viral diseases: Secondary | ICD-10-CM | POA: Diagnosis not present

## 2020-10-28 ENCOUNTER — Telehealth (HOSPITAL_COMMUNITY): Payer: Self-pay | Admitting: *Deleted

## 2020-10-28 NOTE — Telephone Encounter (Signed)
Close encounter 

## 2020-10-29 ENCOUNTER — Ambulatory Visit (HOSPITAL_COMMUNITY)
Admission: RE | Admit: 2020-10-29 | Discharge: 2020-10-29 | Disposition: A | Discharge status: Home / Self Care | Payer: Medicare PPO | Source: Ambulatory Visit | Attending: Cardiovascular Disease | Admitting: Cardiovascular Disease

## 2020-10-29 ENCOUNTER — Other Ambulatory Visit: Payer: Self-pay

## 2020-10-29 DIAGNOSIS — E782 Mixed hyperlipidemia: Secondary | ICD-10-CM | POA: Insufficient documentation

## 2020-10-29 DIAGNOSIS — R0600 Dyspnea, unspecified: Secondary | ICD-10-CM | POA: Insufficient documentation

## 2020-10-29 LAB — MYOCARDIAL PERFUSION IMAGING
LV dias vol: 113 mL (ref 46–106)
LV sys vol: 63 mL
Peak HR: 110 {beats}/min
Rest HR: 68 {beats}/min
SDS: 0
SRS: 0
SSS: 0
TID: 1

## 2020-10-29 MED ORDER — REGADENOSON 0.4 MG/5ML IV SOLN
0.4000 mg | Freq: Once | INTRAVENOUS | Status: AC
  Administered 2020-10-29: 0.4 mg via INTRAVENOUS

## 2020-10-29 MED ORDER — TECHNETIUM TC 99M TETROFOSMIN IV KIT
10.9000 | PACK | Freq: Once | INTRAVENOUS | Status: AC | PRN
  Administered 2020-10-29: 10.9 via INTRAVENOUS
  Filled 2020-10-29: qty 11

## 2020-10-29 MED ORDER — AMINOPHYLLINE 25 MG/ML IV SOLN
150.0000 mg | Freq: Once | INTRAVENOUS | Status: AC
  Administered 2020-10-29: 150 mg via INTRAVENOUS

## 2020-10-29 MED ORDER — TECHNETIUM TC 99M TETROFOSMIN IV KIT
31.8000 | PACK | Freq: Once | INTRAVENOUS | Status: AC | PRN
  Administered 2020-10-29: 31.8 via INTRAVENOUS
  Filled 2020-10-29: qty 32

## 2020-10-30 DIAGNOSIS — M545 Low back pain, unspecified: Secondary | ICD-10-CM | POA: Diagnosis not present

## 2020-11-13 ENCOUNTER — Other Ambulatory Visit: Payer: Self-pay

## 2020-11-13 ENCOUNTER — Ambulatory Visit (HOSPITAL_COMMUNITY): Payer: Medicare PPO | Attending: Cardiology

## 2020-11-13 DIAGNOSIS — E782 Mixed hyperlipidemia: Secondary | ICD-10-CM | POA: Diagnosis not present

## 2020-11-13 DIAGNOSIS — R0600 Dyspnea, unspecified: Secondary | ICD-10-CM | POA: Insufficient documentation

## 2020-11-13 DIAGNOSIS — R0609 Other forms of dyspnea: Secondary | ICD-10-CM

## 2020-11-14 LAB — ECHOCARDIOGRAM COMPLETE
Area-P 1/2: 4.36 cm2
S' Lateral: 3.5 cm

## 2020-12-03 DIAGNOSIS — M5416 Radiculopathy, lumbar region: Secondary | ICD-10-CM | POA: Diagnosis not present

## 2020-12-03 DIAGNOSIS — R202 Paresthesia of skin: Secondary | ICD-10-CM | POA: Diagnosis not present

## 2020-12-08 ENCOUNTER — Ambulatory Visit: Payer: Medicare PPO | Admitting: Neurology

## 2020-12-09 ENCOUNTER — Encounter: Payer: Self-pay | Admitting: Cardiovascular Disease

## 2020-12-09 ENCOUNTER — Ambulatory Visit: Payer: Medicare PPO | Admitting: Cardiovascular Disease

## 2020-12-09 ENCOUNTER — Other Ambulatory Visit: Payer: Self-pay

## 2020-12-09 VITALS — BP 126/64 | HR 68 | Ht 65.5 in | Wt 133.0 lb

## 2020-12-09 DIAGNOSIS — R42 Dizziness and giddiness: Secondary | ICD-10-CM

## 2020-12-09 DIAGNOSIS — R002 Palpitations: Secondary | ICD-10-CM | POA: Diagnosis not present

## 2020-12-09 NOTE — Patient Instructions (Signed)
Medication Instructions:  The current medical regimen is effective;  continue present plan and medications.  *If you need a refill on your cardiac medications before your next appointment, please call your pharmacy*   Testing/Procedures: Your physician has recommended that you wear an event monitor. Event monitors are medical devices that record the heart's electrical activity. Doctors most often [place] these monitors to diagnose arrhythmias. Arrhythmias are problems with the speed or rhythm of the heartbeat. The monitor is a small, portable device. You can wear one while you do your normal daily activities. This is usually used to diagnose what is causing palpitations/syncope (passing out).    Follow-Up: At [facility], you and your health needs are our priority.  As part of our continuing mission to provide you with exceptional heart care, we have created designated Provider Care Teams.  These Care Teams include your primary Cardiologist (physician) and Advanced Practice Providers (APPs -  Physician Assistants and Nurse Practitioners) who all work together to provide you with the care you need, when you need it.  We recommend signing up for the patient portal called "MyChart".  Sign up information is provided on this After Visit Summary.  MyChart is used to connect with patients for Virtual Visits (Telemedicine).  Patients are able to view lab/test results, encounter notes, upcoming appointments, etc.  Non-urgent messages can be sent to your provider as well.   To learn more about what you can do with MyChart, go to [email or web address].    Your next appointment:   2 month(s)  The format for your next appointment:   In Person  Provider:   Nanetta Batty, MD

## 2020-12-09 NOTE — Progress Notes (Signed)
Ms. Virgilio Belling returns today for follow-up of her noninvasive test.  Her 2D echo was essentially normal and her Myoview stress test showed no ischemia with slightly lower EF in her 2D but I suspect this was artifactual.  She did have nonsustained ventricular tachycardia during the Myoview stress test.  She also complains of some episodes of dizziness.  Her shortness of breath which she had post-COVID is gradually resolving spontaneously.  I am going to get a 30-day event monitor to further evaluate her nonsustained ventricular tachycardia and dizziness to see if they are related.  I will see her back in 2 months.  [name], M.D., FACP, [facility], [facility] [address] [place], [place]  [address]  [phone] 12/09/2020 8:54 AM

## 2020-12-13 ENCOUNTER — Ambulatory Visit (INDEPENDENT_AMBULATORY_CARE_PROVIDER_SITE_OTHER): Payer: Medicare PPO

## 2020-12-13 DIAGNOSIS — R002 Palpitations: Secondary | ICD-10-CM | POA: Diagnosis not present

## 2020-12-13 DIAGNOSIS — R42 Dizziness and giddiness: Secondary | ICD-10-CM | POA: Diagnosis not present

## 2020-12-16 ENCOUNTER — Ambulatory Visit: Payer: Medicare PPO | Admitting: Cardiovascular Disease

## 2020-12-18 DIAGNOSIS — Z79899 Other long term (current) drug therapy: Secondary | ICD-10-CM | POA: Diagnosis not present

## 2020-12-18 DIAGNOSIS — M0609 Rheumatoid arthritis without rheumatoid factor, multiple sites: Secondary | ICD-10-CM | POA: Diagnosis not present

## 2020-12-18 DIAGNOSIS — M7062 Trochanteric bursitis, left hip: Secondary | ICD-10-CM | POA: Diagnosis not present

## 2020-12-18 DIAGNOSIS — M159 Polyosteoarthritis, unspecified: Secondary | ICD-10-CM | POA: Diagnosis not present

## 2020-12-18 DIAGNOSIS — M7061 Trochanteric bursitis, right hip: Secondary | ICD-10-CM | POA: Diagnosis not present

## 2020-12-18 DIAGNOSIS — M545 Low back pain, unspecified: Secondary | ICD-10-CM | POA: Diagnosis not present

## 2020-12-18 DIAGNOSIS — M1380 Other specified arthritis, unspecified site: Secondary | ICD-10-CM | POA: Diagnosis not present

## 2021-01-05 DIAGNOSIS — G43109 Migraine with aura, not intractable, without status migrainosus: Secondary | ICD-10-CM | POA: Diagnosis not present

## 2021-01-05 DIAGNOSIS — H35372 Puckering of macula, left eye: Secondary | ICD-10-CM | POA: Diagnosis not present

## 2021-01-05 DIAGNOSIS — Z79899 Other long term (current) drug therapy: Secondary | ICD-10-CM | POA: Diagnosis not present

## 2021-01-05 DIAGNOSIS — H43813 Vitreous degeneration, bilateral: Secondary | ICD-10-CM | POA: Diagnosis not present

## 2021-01-05 DIAGNOSIS — H5213 Myopia, bilateral: Secondary | ICD-10-CM | POA: Diagnosis not present

## 2021-01-05 DIAGNOSIS — H11153 Pinguecula, bilateral: Secondary | ICD-10-CM | POA: Diagnosis not present

## 2021-01-27 ENCOUNTER — Encounter: Payer: Self-pay | Admitting: Cardiovascular Disease

## 2021-01-27 ENCOUNTER — Other Ambulatory Visit: Payer: Self-pay

## 2021-01-27 ENCOUNTER — Ambulatory Visit: Payer: Medicare PPO | Admitting: Cardiovascular Disease

## 2021-01-27 VITALS — BP 122/66 | HR 80 | Ht 65.5 in | Wt 132.0 lb

## 2021-01-27 DIAGNOSIS — I493 Ventricular premature depolarization: Secondary | ICD-10-CM | POA: Diagnosis not present

## 2021-01-27 NOTE — Progress Notes (Signed)
Ms. [name] returns today for follow-up.  She had a 30-day event monitor that showed frequent PVCs and runs of nonsustained ventricular tachycardia.  She has had a normal 2D echo as well as Myoview stress test without evidence of valvular heart disease.  She denies chest pain.  She does notice increasing dyspnea which began as far back as January 2021.  She did develop COVID in May of this year.  Is possible that her dyspnea is related to her frequent PVCs although the etiology of these are still undetermined.  I am going to get a coronary calcium score on her and refer her to EP for evaluation of her excessive PVCs.  [name], M.D., FACP, [facility], [facility] [address] [place], [place]  [address]  [phone] 01/27/2021 4:07 PM

## 2021-01-27 NOTE — Patient Instructions (Signed)
Medication Instructions:  Your physician recommends that you continue on your current medications as directed. Please refer to the Current Medication list given to you today.  *If you need a refill on your cardiac medications before your next appointment, please call your pharmacy*  Testing/Procedures: CT coronary calcium score. This test is done at [address]. This is $99 out of pocket.   Coronary CalciumScan A coronary calcium scan is an imaging test used to look for deposits of calcium and other fatty materials (plaques) in the inner lining of the blood vessels of the heart (coronary arteries). These deposits of calcium and plaques can partly clog and narrow the coronary arteries without producing any symptoms or warning signs. This puts a person at risk for a heart attack. This test can detect these deposits before symptoms develop. Tell a health care provider about: Any allergies you have. All medicines you are taking, including vitamins, herbs, eye drops, creams, and over-the-counter medicines. Any problems you or family members have had with anesthetic medicines. Any blood disorders you have. Any surgeries you have had. Any medical conditions you have. Whether you are pregnant or may be pregnant. What are the risks? Generally, this is a safe procedure. However, problems may occur, including: Harm to a pregnant woman and her unborn baby. This test involves the use of radiation. Radiation exposure can be dangerous to a pregnant woman and her unborn baby. If you are pregnant, you generally should not have this procedure done. Slight increase in the risk of cancer. This is because of the radiation involved in the test. What happens before the procedure? No preparation is needed for this procedure. What happens during the procedure? You will undress and remove any jewelry around your neck or chest. You will put on a hospital gown. Sticky electrodes will be placed on  your chest. The electrodes will be connected to an electrocardiogram (ECG) machine to record a tracing of the electrical activity of your heart. A CT scanner will take pictures of your heart. During this time, you will be asked to lie still and hold your breath for 2-3 seconds while a picture of your heart is being taken. The procedure may vary among health care providers and hospitals. What happens after the procedure? You can get dressed. You can return to your normal activities. It is up to you to get the results of your test. Ask your health care provider, or the department that is doing the test, when your results will be ready. Summary A coronary calcium scan is an imaging test used to look for deposits of calcium and other fatty materials (plaques) in the inner lining of the blood vessels of the heart (coronary arteries). Generally, this is a safe procedure. Tell your health care provider if you are pregnant or may be pregnant. No preparation is needed for this procedure. A CT scanner will take pictures of your heart. You can return to your normal activities after the scan is done. This information is not intended to replace advice given to you by your health care provider. Make sure you discuss any questions you have with your health care provider. Document Released: 11/06/2007 Document Revised: 03/29/2016 Document Reviewed: 03/29/2016 Elsevier Interactive Patient Education  2017 [employer].   Follow-Up: At [facility], you and your health needs are our priority.  As part of our continuing mission to provide you with exceptional heart care, we have created designated Provider Care Teams.  These Care Teams include  your primary Cardiologist (physician) and Advanced Practice Providers (APPs -  Physician Assistants and Nurse Practitioners) who all work together to provide you with the care you need, when you need it.  We recommend signing up for the patient portal called "MyChart".   Sign up information is provided on this After Visit Summary.  MyChart is used to connect with patients for Virtual Visits (Telemedicine).  Patients are able to view lab/test results, encounter notes, upcoming appointments, etc.  Non-urgent messages can be sent to your provider as well.   To learn more about what you can do with MyChart, go to [email or web address].    Your next appointment:   3 month(s)  The format for your next appointment:   In Person  Provider:   Nanetta Batty, MD   Other Instructions You have been referred to: Electrophysiology-we will call to schedule this appointment

## 2021-02-11 ENCOUNTER — Other Ambulatory Visit: Payer: Self-pay

## 2021-02-11 ENCOUNTER — Ambulatory Visit (INDEPENDENT_AMBULATORY_CARE_PROVIDER_SITE_OTHER)
Admission: RE | Admit: 2021-02-11 | Discharge: 2021-02-11 | Disposition: A | Discharge status: Home / Self Care | Payer: Self-pay | Source: Ambulatory Visit | Attending: Cardiovascular Disease | Admitting: Cardiovascular Disease

## 2021-02-11 DIAGNOSIS — I493 Ventricular premature depolarization: Secondary | ICD-10-CM

## 2021-02-12 DIAGNOSIS — G629 Polyneuropathy, unspecified: Secondary | ICD-10-CM | POA: Diagnosis not present

## 2021-02-12 DIAGNOSIS — E785 Hyperlipidemia, unspecified: Secondary | ICD-10-CM | POA: Diagnosis not present

## 2021-02-12 DIAGNOSIS — K219 Gastro-esophageal reflux disease without esophagitis: Secondary | ICD-10-CM | POA: Diagnosis not present

## 2021-02-12 DIAGNOSIS — M1990 Unspecified osteoarthritis, unspecified site: Secondary | ICD-10-CM | POA: Diagnosis not present

## 2021-02-12 DIAGNOSIS — L4050 Arthropathic psoriasis, unspecified: Secondary | ICD-10-CM | POA: Diagnosis not present

## 2021-02-12 DIAGNOSIS — E039 Hypothyroidism, unspecified: Secondary | ICD-10-CM | POA: Diagnosis not present

## 2021-02-12 DIAGNOSIS — L400 Psoriasis vulgaris: Secondary | ICD-10-CM | POA: Diagnosis not present

## 2021-02-12 DIAGNOSIS — I499 Cardiac arrhythmia, unspecified: Secondary | ICD-10-CM | POA: Diagnosis not present

## 2021-02-12 DIAGNOSIS — G8929 Other chronic pain: Secondary | ICD-10-CM | POA: Diagnosis not present

## 2021-02-27 ENCOUNTER — Ambulatory Visit: Payer: Medicare PPO | Admitting: Internal Medicine

## 2021-03-05 ENCOUNTER — Encounter: Payer: Self-pay | Admitting: Internal Medicine

## 2021-03-05 ENCOUNTER — Other Ambulatory Visit: Payer: Self-pay

## 2021-03-05 ENCOUNTER — Ambulatory Visit: Payer: Medicare PPO | Admitting: Internal Medicine

## 2021-03-05 VITALS — BP 122/62 | HR 68 | Temp 97.6°F | Resp 18 | Ht 65.5 in | Wt 133.0 lb

## 2021-03-05 DIAGNOSIS — G43809 Other migraine, not intractable, without status migrainosus: Secondary | ICD-10-CM | POA: Diagnosis not present

## 2021-03-05 DIAGNOSIS — L4050 Arthropathic psoriasis, unspecified: Secondary | ICD-10-CM

## 2021-03-05 DIAGNOSIS — E279 Disorder of adrenal gland, unspecified: Secondary | ICD-10-CM

## 2021-03-05 DIAGNOSIS — E039 Hypothyroidism, unspecified: Secondary | ICD-10-CM

## 2021-03-05 DIAGNOSIS — E782 Mixed hyperlipidemia: Secondary | ICD-10-CM

## 2021-03-05 DIAGNOSIS — G43909 Migraine, unspecified, not intractable, without status migrainosus: Secondary | ICD-10-CM | POA: Insufficient documentation

## 2021-03-05 LAB — CBC
HCT: 40.7 % (ref 36.0–46.0)
Hemoglobin: 13.4 g/dL (ref 12.0–15.0)
MCHC: 33 g/dL (ref 30.0–36.0)
MCV: 99.5 fl (ref 78.0–100.0)
Platelets: 132 10*3/uL — ABNORMAL LOW (ref 150.0–400.0)
RBC: 4.09 Mil/uL (ref 3.87–5.11)
RDW: 13.7 % (ref 11.5–15.5)
WBC: 4.5 10*3/uL (ref 4.0–10.5)

## 2021-03-05 LAB — LIPID PANEL
Cholesterol: 167 mg/dL (ref 0–200)
HDL: 70.8 mg/dL (ref >39.00)
LDL Cholesterol: 86 mg/dL (ref 0–99)
NonHDL: 96.2
Total CHOL/HDL Ratio: 2
Triglycerides: 52 mg/dL (ref 0.0–149.0)
VLDL: 10.4 mg/dL (ref 0.0–40.0)

## 2021-03-05 LAB — COMPREHENSIVE METABOLIC PANEL
ALT: 14 U/L (ref 0–35)
AST: 20 U/L (ref 0–37)
Albumin: 4.4 g/dL (ref 3.5–5.2)
Alkaline Phosphatase: 53 U/L (ref 39–117)
BUN: 13 mg/dL (ref 6–23)
CO2: 30 mEq/L (ref 19–32)
Calcium: 9.4 mg/dL (ref 8.4–10.5)
Chloride: 105 mEq/L (ref 96–112)
Creatinine, Ser: 0.68 mg/dL (ref 0.40–1.20)
GFR: 83.4 mL/min (ref >60.00)
Glucose, Bld: 57 mg/dL — ABNORMAL LOW (ref 70–99)
Potassium: 4.3 mEq/L (ref 3.5–5.1)
Sodium: 141 mEq/L (ref 135–145)
Total Bilirubin: 0.8 mg/dL (ref 0.2–1.2)
Total Protein: 6.6 g/dL (ref 6.0–8.3)

## 2021-03-05 LAB — TSH: TSH: 1.75 u[IU]/mL (ref 0.35–5.50)

## 2021-03-05 MED ORDER — ROSUVASTATIN CALCIUM 5 MG PO TABS: 5.0000 mg | ORAL_TABLET | Freq: Every day | ORAL | 3 refills | Status: DC

## 2021-03-05 MED ORDER — BUTALBITAL-ASPIRIN-CAFFEINE 50-325-40 MG PO CAPS: 1.0000 | ORAL_CAPSULE | ORAL | 0 refills | Status: DC | PRN

## 2021-03-05 MED ORDER — LEVOTHYROXINE SODIUM 50 MCG PO TABS: 50.0000 ug | ORAL_TABLET | Freq: Every morning | ORAL | 3 refills | Status: DC

## 2021-03-05 NOTE — Progress Notes (Signed)
   Subjective:   Patient ID: [name], female    DOB: 29-Mar-1941, 80 y.o.   MRN: [id number]  HPI The patient is a new 80 YO female coming in for ongoing care.  PMH, [facility], social history reviewed and updated  Review of Systems  Constitutional: Negative.   HENT: Negative.    Eyes: Negative.   Respiratory:  Negative for cough, chest tightness and shortness of breath.   Cardiovascular:  Negative for chest pain, palpitations and leg swelling.  Gastrointestinal:  Negative for abdominal distention, abdominal pain, constipation, diarrhea, nausea and vomiting.  Musculoskeletal:  Positive for arthralgias.  Skin: Negative.   Neurological: Negative.   Psychiatric/Behavioral: Negative.     Objective:  Physical Exam Constitutional:      Appearance: She is well-developed.  HENT:     Head: Normocephalic and atraumatic.  Cardiovascular:     Rate and Rhythm: Normal rate and regular rhythm.     Comments: Some irregular beats Pulmonary:     Effort: Pulmonary effort is normal. No respiratory distress.     Breath sounds: Normal breath sounds. No wheezing or rales.  Abdominal:     General: Bowel sounds are normal. There is no distension.     Palpations: Abdomen is soft.     Tenderness: There is no abdominal tenderness. There is no rebound.  Musculoskeletal:     Cervical back: Normal range of motion.  Skin:    General: Skin is warm and dry.     Comments: Back examined for moles, none suspicious appearing  Neurological:     Mental Status: She is alert and oriented to person, place, and time.     Coordination: Coordination normal.    Vitals:   03/05/21 1038  BP: 122/62  Pulse: 68  Resp: 18  Temp: 97.6 F (36.4 C)  TempSrc: Oral  SpO2: 99%  Weight: 133 lb (60.3 kg)  Height: 5' 5.5" (1.664 m)    This visit occurred during the SARS-CoV-2 public health emergency.  Safety protocols were in place, including screening questions prior to the visit, additional usage of staff PPE, and  extensive cleaning of exam room while observing appropriate contact time as indicated for disinfecting solutions.   Assessment & Plan:

## 2021-03-05 NOTE — Assessment & Plan Note (Signed)
Checking TSH for current level of control. She is on synthroid 50 mg daily and has been for some time. Prescribed that today.

## 2021-03-05 NOTE — Assessment & Plan Note (Signed)
Is taking hormonal treatments with progesterone and estrogen. Gyn is monitoring dosing and levels.

## 2021-03-05 NOTE — Assessment & Plan Note (Signed)
Seeing rheumatology and is overall controlled and stable on plaquenil and sulfasalazine. Checking CBC and CMP for stability of therapy and she gets yearly eye exam and understands the important with plaquenil therapy. Does have some concurrent OA as well especially in her hands.

## 2021-03-05 NOTE — Patient Instructions (Addendum)
We have sent in the refills for your medicines today.  We will check the labs today.  Vaccines.gov to find a booster for covid-19 near you.

## 2021-03-05 NOTE — Assessment & Plan Note (Signed)
Checking lipid panel and prescribed crestor 5 mg daily. Will adjust dosing as needed based on results. Does have high risk stress testing so will aim for goal LDL <70 ideal.

## 2021-03-05 NOTE — Assessment & Plan Note (Signed)
Overall rare but severe when present. She uses fiorinal for headaches when present. Last rx 2020 for #30 and requires refill today which is prescribed for her. No change in pattern to suggest need for imaging.

## 2021-03-06 ENCOUNTER — Ambulatory Visit (INDEPENDENT_AMBULATORY_CARE_PROVIDER_SITE_OTHER): Payer: Medicare PPO

## 2021-03-06 ENCOUNTER — Ambulatory Visit: Payer: Medicare PPO | Admitting: Cardiology

## 2021-03-06 ENCOUNTER — Encounter: Payer: Self-pay | Admitting: Cardiology

## 2021-03-06 VITALS — BP 110/68 | HR 69 | Ht 65.5 in | Wt 134.0 lb

## 2021-03-06 DIAGNOSIS — I493 Ventricular premature depolarization: Secondary | ICD-10-CM

## 2021-03-06 DIAGNOSIS — R002 Palpitations: Secondary | ICD-10-CM

## 2021-03-06 DIAGNOSIS — R0609 Other forms of dyspnea: Secondary | ICD-10-CM

## 2021-03-06 MED ORDER — AMIODARONE HCL 200 MG PO TABS: 200.0000 mg | ORAL_TABLET | Freq: Every day | ORAL | 3 refills | Status: DC

## 2021-03-06 NOTE — Progress Notes (Unsigned)
Enrolled patient for a 7 day Zio XT monitor to be mailed to patients home around 05/30/21

## 2021-03-06 NOTE — Patient Instructions (Addendum)
Medication Instructions:  Your physician has recommended you make the following change in your medication:    Start taking amiodarone 200 mg-  Take one tablet by mouth daily.  *If you need a refill on your cardiac medications before your next appointment, please call your pharmacy*  Lab Work: You will come to the [facility] office in 6-8 weeks for lab work: April 23, 2021--anytime between 8:00 am and 4:30 pm.  You do not need to be fasting.  If you have labs (blood work) drawn today and your tests are completely normal, you will receive your results only by: MyChart Message (if you have MyChart) OR A paper copy in the mail If you have any lab test that is abnormal or we need to change your treatment, we will call you to review the results.  Testing/Procedures: Your physician has recommended that you wear a holter monitor. Holter monitors are medical devices that record the heart's electrical activity. Doctors most often use these monitors to diagnose arrhythmias. Arrhythmias are problems with the speed or rhythm of the heartbeat. The monitor is a small, portable device. You can wear one while you do your normal daily activities. This is usually used to diagnose what is causing palpitations/syncope (passing out).  You will wear a ZIO monitor for 5 days in 3 months START monitor June 07, 2021  Follow-Up: At [facility], you and your health needs are our priority.  As part of our continuing mission to provide you with exceptional heart care, we have created designated Provider Care Teams.  These Care Teams include your primary Cardiologist (physician) and Advanced Practice Providers (APPs -  Physician Assistants and Nurse Practitioners) who all work together to provide you with the care you need, when you need it.  Your next appointment:   Your physician wants you to follow-up in: 4 months   June 29, 2021 at 8:45 am with Francis Dowse, PA  Amiodarone Tablets What is this  medication? AMIODARONE (a MEE oh da rone) prevents and treats a fast or irregular heartbeat (arrhythmia). It works by slowing down overactive electric signals in the heart, which stabilizes your heart rhythm. It belongs to a group of medications called antiarrhythmics. This medicine may be used for other purposes; ask your health care provider or pharmacist if you have questions. COMMON BRAND NAME(S): Cordarone, Pacerone What should I tell my care team before I take this medication? They need to know if you have any of these conditions: Liver disease Lung disease Other heart problems Thyroid disease An unusual or allergic reaction to amiodarone, iodine, other medications, foods, dyes, or preservatives Pregnant or trying to get pregnant Breast-feeding How should I use this medication? Take this medication by mouth with a glass of water. Follow the directions on the prescription label. You can take this medication with or without food. However, you should always take it the same way each time. Take your doses at regular intervals. Do not take your medication more often than directed. Do not stop taking except on the advice of your care team. A special MedGuide will be given to you by the pharmacist with each prescription and refill. Be sure to read this information carefully each time. Talk to your care team regarding the use of this medication in children. Special care may be needed. Overdosage: If you think you have taken too much of this medicine contact a poison control center or emergency room at once. NOTE: This medicine is only for you. Do not share  this medicine with others. What if I miss a dose? If you miss a dose, take it as soon as you can. If it is almost time for your next dose, take only that dose. Do not take double or extra doses. What may interact with this medication? Do not take this medication with any of the following: Abarelix Apomorphine Arsenic trioxide Certain  antibiotics like erythromycin, gemifloxacin, levofloxacin, pentamidine Certain medications for depression like amoxapine, tricyclic antidepressants Certain medications for fungal infections like fluconazole, itraconazole, ketoconazole, posaconazole, voriconazole Certain medications for irregular heartbeat like disopyramide, dronedarone, ibutilide, propafenone, sotalol Certain medications for malaria like chloroquine, halofantrine Cisapride Droperidol Haloperidol [place] Maprotiline Methadone Phenothiazines like chlorpromazine, mesoridazine, thioridazine Pimozide Ranolazine Red yeast rice Vardenafil This medication may also interact with the following: Antiviral medications for HIV or AIDS Certain medications for blood pressure, heart disease, irregular heart beat Certain medications for cholesterol like atorvastatin, cerivastatin, lovastatin, simvastatin Certain medications for hepatitis C like sofosbuvir and ledipasvir; sofosbuvir Certain medications for seizures like phenytoin Certain medications for thyroid problems Certain medications that treat or prevent blood clots like warfarin Cholestyramine Cimetidine Clopidogrel Cyclosporine Dextromethorphan Diuretics Dofetilide Fentanyl General anesthetics Grapefruit juice Lidocaine Loratadine Methotrexate Other medications that prolong the QT interval (cause an abnormal heart rhythm) Procainamide Quinidine Rifabutin, rifampin, or rifapentine St. John's Wort Trazodone Ziprasidone This list may not describe all possible interactions. Give your health care provider a list of all the medicines, herbs, non-prescription drugs, or dietary supplements you use. Also tell them if you smoke, drink alcohol, or use illegal drugs. Some items may interact with your medicine. What should I watch for while using this medication? Your condition will be monitored closely when you first begin therapy. Often, this medication is first started in  a hospital or other monitored health care setting. Once you are on maintenance therapy, visit your care team for regular checks on your progress. Because your condition and use of this medication carry some risk, it is a good idea to carry an identification card, necklace or bracelet with details of your condition, medications, and care team. You may get drowsy or dizzy. Do not drive, use machinery, or do anything that needs mental alertness until you know how this medication affects you. Do not stand or sit up quickly, especially if you are an older patient. This reduces the risk of dizzy or fainting spells. This medication can make you more sensitive to the sun. Keep out of the sun. If you cannot avoid being in the sun, wear protective clothing and use sunscreen. Do not use sun lamps or tanning beds/booths. You should have regular eye exams before and during treatment. Call your care team if you have blurred vision, see halos, or your eyes become sensitive to light. Your eyes may get dry. It may be helpful to use a lubricating eye solution or artificial tears solution. If you are going to have surgery or a procedure that requires contrast dyes, tell your care team that you are taking this medication. What side effects may I notice from receiving this medication? Side effects that you should report to your care team as soon as possible: Allergic reactions-skin rash, itching, hives, swelling of the face, lips, tongue, or throat Bluish-gray skin Change in vision such as blurry vision, seeing halos around lights, vision loss Heart failure-shortness of breath, swelling of the ankles, feet, or hands, sudden weight gain, unusual weakness or fatigue Heart rhythm changes-fast or irregular heartbeat, dizziness, feeling faint or lightheaded, chest pain, trouble breathing High  thyroid levels (hyperthyroidism)-fast or irregular heartbeat, weight loss, excessive sweating or sensitivity to heat, tremors or shaking,  anxiety, nervousness, irregular menstrual cycle or spotting Liver injury-right upper belly pain, loss of appetite, nausea, light-colored stool, dark yellow or brown urine, yellowing skin or eyes, unusual weakness or fatigue Low thyroid levels (hypothyroidism)-unusual weakness or fatigue, sensitivity to cold, constipation, hair loss, dry skin, weight gain, feelings of depression Lung injury-shortness of breath or trouble breathing, cough, spitting up blood, chest pain, fever Pain, tingling, or numbness in the hands or feet, muscle weakness, trouble walking, loss of balance or coordination Side effects that usually do not require medical attention (report to your care team if they continue or are bothersome): Nausea Vomiting This list may not describe all possible side effects. Call your doctor for medical advice about side effects. You may report side effects to FDA at 1-800-FDA-1088. Where should I keep my medication? Keep out of the reach of children and pets. Store at room temperature between 20 and 25 degrees C (68 and 77 degrees F). Protect from light. Keep container tightly closed. Throw away any unused medication after the expiration date. NOTE: This sheet is a summary. It may not cover all possible information. If you have questions about this medicine, talk to your doctor, pharmacist, or health care provider.  2022 Elsevier/Gold Standard (2020-06-12 11:04:04)

## 2021-03-06 NOTE — Progress Notes (Signed)
Electrophysiology Office Note:    Date:  03/06/2021   ID:  [name], DOB 11-13-1940, MRN [id number]  PCP:  Hoyt Koch, MD  [facility] Cardiologist:  None  [facility] Electrophysiologist:  Vickie Epley, MD   Referring MD: Lorretta Harp, MD   Chief Complaint: PVCs  History of Present Illness:    [name] is a 80 y.o. female who presents for an evaluation of PVCs at the request of Dr. Gwenlyn Found. Their medical history includes hypothyroidism, hyperlipidemia, psoriatic arthritis.  The patient last saw Dr. Gwenlyn Found on January 27, 2021.  The visit was in follow-up after she wore a 30-day event monitor which showed frequent PVCs and runs of nonsustained ventricular tachycardia.  She reported dyspnea during that visit which was thought to be related to her PVCs. Today she confirms the above history.  From her history seems that the PVCs come and go.    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[place])     dr Helane Rima, coricare B now resolved   Anemia    iron   Arthritis    psoriatic  in hips oa in hands   Blood transfusion without reported diagnosis 1978   Cataract    chronic headaches    histamine migraines, rarely occur   Chronic kidney disease    hx bladder infections yrs ago   Colon polyp yrs ago   [place] to 1999   wore holter monitor result of accidental fall, occasional irregular pulse   Family history of adverse reaction to anesthesia    father had aspiration after anesthesia one time   Gastric polyps    GERD (gastroesophageal reflux disease)    History of migraine headaches    Hyperlipidemia    Hypothyroidism    Neuromuscular disease (HCC)    fibromyalgia   Pneumonia 2005aspiration pneumonia   stopped when got pneumonia shot, also 2015 regular pneumonia   PONV (postoperative nausea and vomiting)    patient wishes to have no anesthetics that could affect memory due to past memory issues, no cause  found   Psoriatic arthritis ([place])     Past Surgical History:  Procedure Laterality Date   ABDOMINAL HYSTERECTOMY     left ovary left and tube left   APPENDECTOMY     with hysterectomy   CARDIAC CATHETERIZATION     CATARACT EXTRACTION, BILATERAL     CHOLECYSTECTOMY     COLONOSCOPY     FINGER SURGERY     middle finger right hand   INCONTINENCE SURGERY     bladder sling   RECTOCELE REPAIR     RHINOPLASTY     SHOULDER ARTHROSCOPY WITH ROTATOR CUFF REPAIR AND SUBACROMIAL DECOMPRESSION Right 06/17/2015   Procedure: SHOULDER DIAGNOSTIC OPERATIVE ARTHROSCOPY WITH MINI-OPEN ROTATOR CUFF REPAIR AND SUBACROMIAL DECOMPRESSION;  Surgeon: Meredith Pel, MD;  Location: [place];  Service: Orthopedics;  Laterality: Right;   TONSILLECTOMY  age 26   adenoids also   TOTAL HIP ARTHROPLASTY Right 07/06/2017   Procedure: RIGHT TOTAL HIP ARTHROPLASTY ANTERIOR APPROACH;  Surgeon: Gaynelle Arabian, MD;  Location: [facility];  Service: Orthopedics;  Laterality: Right;    Current Medications: Current Meds  Medication Sig   amiodarone (PACERONE) 200 MG tablet Take 1 tablet (200 mg total) by mouth daily.   butalbital-aspirin-caffeine (FIORINAL) 50-325-40 MG capsule Take 1 capsule by mouth every 4 (four) hours as needed for headache.   estradiol (CLIMARA - DOSED IN MG/24 HR) 0.1  mg/24hr patch Climara 0.1 mg/24 hr transdermal patch   fluocinonide (LIDEX) 0.05 % external solution Apply 1 application topically 2 (two) times daily as needed (for psorasis.).    gabapentin (NEURONTIN) 100 MG capsule Take 3 capsules (300 mg total) by mouth at bedtime. (Patient taking differently: Take 400 mg by mouth at bedtime.)   hydroxychloroquine (PLAQUENIL) 200 MG tablet Take 1.5 tablets (300 mg total) by mouth daily.   levothyroxine (SYNTHROID) 50 MCG tablet Take 1 tablet (50 mcg total) by mouth every morning.   Magnesium Oxide 250 MG TABS Take 250-500 mg by mouth daily. 250 mg in the am and 500 mg in the pm   progesterone  (PROMETRIUM) 100 MG capsule Take 100 mg by mouth at bedtime.   rosuvastatin (CRESTOR) 5 MG tablet Take 1 tablet (5 mg total) by mouth daily.   sulfaSALAzine (AZULFIDINE) 500 MG tablet Take 1,000-1,500 mg by mouth 2 (two) times daily. 1000 mg in the am and 1500 mg at night   TESTOSTERONE PROPIONATE IJ Place 1 application onto the skin 3 (three) times a week.   VITAMIN D PO Take 1,000 mg by mouth daily.     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   Patient has two children.   Patient is retired.   Patient has a Master's degree.   Patient drinks two cups of coffee daily.   Patient is right-handed.         Social Determinants of Health   Financial Resource Strain: Not on file  Food Insecurity: Not on file  Transportation Needs: Not on file  Physical Activity: Not on file  Stress: Not on file  Social Connections: Not on file     Family History: The patient's family history includes Anxiety disorder in her mother; Breast cancer in her mother; Colon cancer in her mother; Depression in her mother; Diabetes in her paternal grandmother; Esophageal cancer in her father; Heart attack in her paternal grandfather; Hypertension in her father; Liver cancer in her maternal  grandmother; Stomach cancer in her father. There is no history of Rectal cancer.  ROS:   Please see the history of present illness.    All other systems reviewed and are negative.  EKGs/Labs/Other Studies Reviewed:    The following studies were reviewed today:  March 06, 2021 preventives monitor personally reviewed 11% PVC burden No sustained VT episodes   November 14, 2020 echo personally reviewed Left ventricular function normal, 55% Right ventricular function normal Trivial MR   EKG:  The ekg ordered today demonstrates sinus rhythm.   Recent Labs: 03/05/2021: ALT 14; BUN 13; Creatinine, Ser 0.68; Hemoglobin 13.4; Platelets 132.0; Potassium 4.3; Sodium  141; TSH 1.75  Recent Lipid Panel    Component Value Date/Time   CHOL 167 03/05/2021 1128   TRIG 52.0 03/05/2021 1128   HDL 70.80 03/05/2021 1128   CHOLHDL 2 03/05/2021 1128   VLDL 10.4 03/05/2021 1128   LDLCALC 86 03/05/2021 1128    Physical Exam:    VS:  BP 110/68   Pulse 69   Ht 5' 5.5" (1.664 m)   Wt 134 lb (60.8 kg)   BMI 21.96 kg/m     Wt Readings from Last 3 Encounters:  03/06/21 134 lb (60.8 kg)  03/05/21 133 lb (60.3 kg)  01/27/21 132 lb (59.9 kg)     GEN:  Well nourished, well developed in no acute distress.  Appears younger than stated age 14: Normal NECK: No JVD; No carotid bruits LYMPHATICS: No lymphadenopathy CARDIAC: RRR, no murmurs, rubs, gallops RESPIRATORY:  Clear to auscultation without rales, wheezing or rhonchi  ABDOMEN: Soft, non-tender, non-distended MUSCULOSKELETAL:  No edema; No deformity  SKIN: Warm and dry NEUROLOGIC:  Alert and oriented x 3 PSYCHIATRIC:  Normal affect       ASSESSMENT:    1. Frequent PVCs   2. Palpitations   3. Dyspnea on exertion    PLAN:    In order of problems listed above:  #PVCs Symptomatic with palpitations, lightheadedness and dyspnea with exertion.  Seems like her PVCs come and go.  I discussed the various treatment options available.   I would favor using low-dose amiodarone 200 mg by mouth once daily.  We will plan to repeat a ZIO monitor in about 3 months to see how we are doing with her PVC burden.  She will also get lab work in 6 to 8 weeks to recheck her liver and thyroid function.  We discussed the possibility of needing to adjust her Synthroid after starting the amiodarone.  We discussed the possibility that she would not feel any different after starting the amiodarone in suppressing the PVCs.  If this is the case, would favor discontinuing the amiodarone.  If she feels better with the amiodarone once the PVCs are suppressed, would favor continuing the amiodarone with periodic monitoring of her liver and thyroid function.  Follow-up in about 4 months to review PVC burden on repeat ZIO and repeat blood work.    Total time spent with patient today 45 minutes. This includes reviewing records, evaluating the patient and coordinating care.  Medication Adjustments/Labs and Tests Ordered: Current medicines are reviewed at length with the patient today.  Concerns regarding medicines are outlined above.  Orders Placed This Encounter  Procedures   Comp Met (CMET)   TSH   T4, free   LONG TERM MONITOR (3-14 DAYS)   EKG 12-Lead   Meds ordered this encounter  Medications   amiodarone (PACERONE) 200 MG tablet    Sig: Take 1 tablet (200 mg total) by mouth daily.    Dispense:  90 tablet    Refill:  3     Signed, Lysbeth Galas T. Quentin Ore, MD, [facility], [facility] 03/06/2021 10:02 PM    Electrophysiology [facility]

## 2021-03-16 ENCOUNTER — Telehealth: Payer: Self-pay | Admitting: Physician Assistant

## 2021-03-16 NOTE — Telephone Encounter (Signed)
Patient started on amiodarone last night, after taking 1 dose, she had 2-hour onset of worsening palpitation.  She noticed on interaction check that amiodarone can eventually react with hydroxychloroquine she is taking for psoriatic arthritis by prolonging the QT interval.  She has a history of prolonged QT back in 2019.  However recent EKG had a normal QT interval.  Given the 2-hour onset of palpitation yesterday, she is hesitant to try the amiodarone tonight.  I asked her to hold amiodarone tonight.  Will forward to Dr. Lalla Brothers to see if he would recommend continue on the amiodarone trial since she only had one night of worsening palpitation after the initial dose.  Also will see if he would recommend a follow-up EKG to look at QT interval on both medication

## 2021-03-17 ENCOUNTER — Encounter: Payer: Self-pay | Admitting: Neurology

## 2021-03-17 ENCOUNTER — Ambulatory Visit: Payer: Medicare PPO | Admitting: Neurology

## 2021-03-17 ENCOUNTER — Other Ambulatory Visit: Payer: Self-pay

## 2021-03-17 VITALS — BP 108/59 | HR 64 | Ht 65.5 in | Wt 136.5 lb

## 2021-03-17 DIAGNOSIS — M5416 Radiculopathy, lumbar region: Secondary | ICD-10-CM | POA: Diagnosis not present

## 2021-03-17 DIAGNOSIS — G629 Polyneuropathy, unspecified: Secondary | ICD-10-CM | POA: Diagnosis not present

## 2021-03-17 DIAGNOSIS — M2550 Pain in unspecified joint: Secondary | ICD-10-CM | POA: Diagnosis not present

## 2021-03-17 DIAGNOSIS — L4050 Arthropathic psoriasis, unspecified: Secondary | ICD-10-CM
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
## 2021-03-20 ENCOUNTER — Telehealth: Payer: Self-pay | Admitting: Cardiovascular Disease

## 2021-03-20 NOTE — Telephone Encounter (Signed)
Spoke to patient she stated she cannot take Amiodarone 200 mg.Stated she has tried taking twice and palpitations much worse.She spoke to PA on call this past Monday night and was told to call back if she did not hear anything from [facility].Stated she has not heard back.Stated she is feeling much better since she stopped taking Amiodarone.She continues to have sob with exertion.Stated she would like appointment to discuss further.Left message with Dr.Lambert's scheduler to call patient to schedule appointment with Dr.Lambert's PA Francis Dowse.

## 2021-03-20 NOTE — Telephone Encounter (Signed)
Patient returned call

## 2021-03-20 NOTE — Telephone Encounter (Signed)
Patient c/o Palpitations:  High priority if patient c/o lightheadedness, shortness of breath, or chest pain  How long have you had palpitations/irregular HR/ Afib? Are you having the symptoms now?  Patient states on 03/15/21, during the night, she had an episode of palpitations. She states her heart began beating so fast and hard that she could feel it in her fingertips. She is no longer having that feeling, but she assumes it is due to taking Amiodarone with hydroxychloroquine. Are you currently experiencing lightheadedness, SOB or CP?  No  Do you have a history of afib (atrial fibrillation) or irregular heart rhythm?  Yes, patient has a hx of PVC's  Have you checked your BP or HR? (document readings if available):   Are you experiencing any other symptoms?  Nausea (during episode on 10/23)

## 2021-03-26 NOTE — Progress Notes (Signed)
Cardiology Office Note Date:  03/26/2021  Patient ID:  [name], DOB 04-02-1941, MRN [id number] PCP:  Myrlene Broker, MD  Cardiologist:  Dr. Allyson Sabal Electrophysiologist: Dr. Lalla Brothers    Chief Complaint:  Dr. [place] to discuss medicines  History of Present Illness: [name] is a 80 y.o. female with history of psoriatic arthritis, HLD, hypothyroidism  She sw dr. Allyson Sabal May 2022 with c/o DOE, this prompted stress testing, echo and monitoring Her f/u with him noted She has had a normal 2D echo as well as Myoview stress test without evidence of vascular heart disease, monitor showed frequent PVCs and runs of nonsustained ventricular tachycardia and referred to EP   She saw Dr. Lalla Brothers 03/06/21, palpitations waxed/waned, planned to start amiodarone 200mg  daily and repeat monitoring in about 23mo, labs in 6-8 weeks, discussed possible need to adjust synthroid dosing w/amiodaorne, if no improvement in symptoms with suppression of PVCs, could stop it, if better would continue  Telephone note 03/20/21 with worsened palpitations since amio ("tried to take it twice), in f/u call, feeling better since stopping amio though c/w DOE  NOTED with her stress test, EF by this modality was 30-44%, "EF 44%" and had sustained ventricular tachycardia occurring during recovery, no ischemic or infarct on perfusion. Echo 55-60% with normal strain (-20.6)   Can not find tracings of sustained VT???  NSVT by Dr. 03/22/21 result note  TODAY She states that she took one dose of amiodarone at HS (as instructed) and about 4:30AM was woken feeling strange, she felt her pulse was all over the place, pounding heart beat to the point that she could feel her pulse in her fingertips.  This fels much different then her PVCs. No near syncope or any CP but very anxiety provoking and felt "unusual" Lasy a couple hours, went back and forth about calling 911 Finally quit.  She has not taken another dose of  amiodarone and has not happended again.  She reports that she has known about her PVCs for many years, they themselves do not really give her any symptoms, or at least did not used to  Her DOE started about Jan, was worse after COVID illness in May, this prompted her work up   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HCC)     dr June, coricare B now resolved   Anemia    iron   Arthritis    psoriatic  in hips oa in hands   Blood transfusion without reported diagnosis 1978   Cataract    chronic headaches    histamine migraines, rarely occur   Chronic kidney disease    hx bladder infections yrs ago   Colon polyp yrs ago   Dysrhythmia 1996 to 1999   wore holter monitor result of accidental fall, occasional irregular pulse   Family history of adverse reaction to anesthesia    father had aspiration after anesthesia one time   Gastric polyps    GERD (gastroesophageal reflux disease)    History of migraine headaches    Hyperlipidemia    Hypothyroidism    Neuromuscular disease (HCC)    fibromyalgia   Pneumonia 2005aspiration pneumonia   stopped when got pneumonia shot, also 2015 regular pneumonia   PONV (postoperative nausea and vomiting)    patient wishes to have no anesthetics that could affect memory due to past memory issues, no cause found   Psoriatic arthritis [facility])     Past Surgical History:  Procedure Laterality Date   ABDOMINAL HYSTERECTOMY     left ovary left and tube left   APPENDECTOMY     with hysterectomy   CARDIAC CATHETERIZATION     CATARACT EXTRACTION, BILATERAL     CHOLECYSTECTOMY     COLONOSCOPY     FINGER SURGERY     middle finger right hand   INCONTINENCE SURGERY     bladder sling   RECTOCELE REPAIR     RHINOPLASTY     SHOULDER ARTHROSCOPY WITH ROTATOR CUFF REPAIR AND SUBACROMIAL DECOMPRESSION Right 06/17/2015   Procedure: SHOULDER DIAGNOSTIC OPERATIVE ARTHROSCOPY WITH MINI-OPEN ROTATOR CUFF REPAIR AND SUBACROMIAL  DECOMPRESSION;  Surgeon: Cammy Copa, MD;  Location: [facility] OR;  Service: Orthopedics;  Laterality: Right;   TONSILLECTOMY  age 34   adenoids also   TOTAL HIP ARTHROPLASTY Right 07/06/2017   Procedure: RIGHT TOTAL HIP ARTHROPLASTY ANTERIOR APPROACH;  Surgeon: Ollen Gross, MD;  Location: [facility] ORS;  Service: Orthopedics;  Laterality: Right;    Current Outpatient Medications  Medication Sig Dispense Refill   amiodarone (PACERONE) 200 MG tablet Take 1 tablet (200 mg total) by mouth daily. (Patient not taking: Reported on 03/17/2021) 90 tablet 3   butalbital-aspirin-caffeine (FIORINAL) 50-325-40 MG capsule Take 1 capsule by mouth every 4 (four) hours as needed for headache. 30 capsule 0   estradiol (CLIMARA - DOSED IN MG/24 HR) 0.1 mg/24hr patch Climara 0.1 mg/24 hr transdermal patch     fluocinonide (LIDEX) 0.05 % external solution Apply 1 application topically 2 (two) times daily as needed (for psorasis.).      gabapentin (NEURONTIN) 100 MG capsule Take 3 capsules (300 mg total) by mouth at bedtime. (Patient taking differently: Take 400 mg by mouth at bedtime.) 90 capsule 5   hydroxychloroquine (PLAQUENIL) 200 MG tablet Take 1.5 tablets (300 mg total) by mouth daily. 60 tablet 2   levothyroxine (SYNTHROID) 50 MCG tablet Take 1 tablet (50 mcg total) by mouth every morning. 90 tablet 3   Magnesium Oxide 250 MG TABS Take 250-500 mg by mouth daily. 250 mg in the am and 500 mg in the pm     progesterone (PROMETRIUM) 100 MG capsule Take 100 mg by mouth at bedtime.     rosuvastatin (CRESTOR) 5 MG tablet Take 1 tablet (5 mg total) by mouth daily. 90 tablet 3   sulfaSALAzine (AZULFIDINE) 500 MG tablet Take 1,000-1,500 mg by mouth 2 (two) times daily. 1000 mg in the am and 1500 mg at night     TESTOSTERONE PROPIONATE IJ Place 1 application onto the skin 3 (three) times a week.     VITAMIN D PO Take 1,000 mg by mouth daily.     No current facility-administered medications for this visit.    Allergies:    Imitrex [sumatriptan], Nsaids, Ciprofloxacin hcl, and Keflex [cephalexin]   Social History:  The patient  reports that she has never smoked. She has never used smokeless tobacco. She reports current alcohol use. She reports that she does not use drugs.   Family History:  The patient's family history includes Anxiety disorder in her mother; Breast cancer in her mother; Colon cancer in her mother; Depression in her mother; Diabetes in her paternal grandmother; Esophageal cancer in her father; Heart attack in her paternal grandfather; Hypertension in her father; Liver cancer in her maternal grandmother; Stomach cancer in her father.  ROS:  Please see the history of present illness.    All other systems are reviewed and otherwise negative.   PHYSICAL  EXAM:  VS:  There were no vitals taken for this visit. BMI: There is no height or weight on file to calculate BMI. Well nourished, well developed, in no acute distress HEENT: normocephalic, atraumatic Neck: no JVD, carotid bruits or masses Cardiac:  RRR; no significant murmurs, no rubs, or gallops, no extrasystoles with prolonged auscultation Lungs:  CTA b/l, no wheezing, rhonchi or rales Abd: soft, nontender MS: no deformity or atrophy Ext: no edema Skin: warm and dry, no rash Neuro:  No gross deficits appreciated Psych: euthymic mood, full affect    EKG:  not done today  01/27/21: monitor result 1. SR/SB/ST 2. Freq PVCs 3. Freq runs of NSVT 4. Pt has been referred to EP for further eval   11/13/20: TTE IMPRESSIONS   1. Left ventricular ejection fraction, by estimation, is 55 to 60%. Left  ventricular ejection fraction by 3D volume is 58 %. The left ventricle has  normal function. The left ventricle has no regional wall motion  abnormalities. Left ventricular diastolic   parameters were normal. The average left ventricular global longitudinal  strain is -20.6 %. The global longitudinal strain is normal.   2. Right ventricular  systolic function is normal. The right ventricular  size is normal. There is normal pulmonary artery systolic pressure.   3. The mitral valve is grossly normal. Trivial mitral valve  regurgitation. No evidence of mitral stenosis.   4. The aortic valve is tricuspid. There is mild calcification of the  aortic valve. Aortic valve regurgitation is not visualized. Mild aortic  valve sclerosis is present, with no evidence of aortic valve stenosis.   5. The inferior vena cava is normal in size with greater than 50%  respiratory variability, suggesting right atrial pressure of 3 mmHg.   Comparison(s): No significant change from prior study.   10/29/20: stress myoview The left ventricular ejection fraction is moderately decreased (30-44%). Nuclear stress EF: 44%. There were high risk electrocardiographic findings with sustained ventricular tachycardia occurring during recovery. Myocardial perfusion appeared normal with no evidence of ischemia or infarction. Based on ECG findings with sustained VT during recovery and moderately reduced LVEF, this is a high risk study.   Recent Labs: 03/05/2021: ALT 14; BUN 13; Creatinine, Ser 0.68; Hemoglobin 13.4; Platelets 132.0; Potassium 4.3; Sodium 141; TSH 1.75  03/05/2021: Cholesterol 167; HDL 70.80; LDL Cholesterol 86; Total CHOL/HDL Ratio 2; Triglycerides 52.0; VLDL 10.4   Estimated Creatinine Clearance: 53.2 mL/min (by C-G formula based on SCr of 0.68 mg/dL).   Wt Readings from Last 3 Encounters:  03/17/21 136 lb 8 oz (61.9 kg)  03/06/21 134 lb (60.8 kg)  03/05/21 133 lb (60.3 kg)     Other studies reviewed: Additional studies/records reviewed today include: summarized above  ASSESSMENT AND PLAN:  PVCs VT (sustained during recovery on stress test, NSVT on monitor) Reviewed stress test tracings with Dr. Lalla Brothers, suspect automatic focus, probably papillary Stop amiodarone, start with low dose BB,  Toprol 25mg  daily  DOE Uncertain  etiology Defer to Dr.  Disposition: F/u with Allyson Sabal in 3-4 weeks, sooner if needed  Current medicines are reviewed at length with the patient today.  The patient did not have any concerns regarding medicines.  [place], PA-C 03/26/2021 7:22 AM     [facility] [address] [place] [phone] (office)  [phone] (fax)

## 2021-03-27 ENCOUNTER — Other Ambulatory Visit: Payer: Self-pay

## 2021-03-27 ENCOUNTER — Encounter: Payer: Self-pay | Admitting: Physician Assistant

## 2021-03-27 ENCOUNTER — Ambulatory Visit: Payer: Medicare PPO | Admitting: Physician Assistant

## 2021-03-27 VITALS — BP 122/62 | HR 66 | Ht 65.5 in | Wt 136.0 lb

## 2021-03-27 DIAGNOSIS — I493 Ventricular premature depolarization: Secondary | ICD-10-CM

## 2021-03-27 DIAGNOSIS — R002 Palpitations: Secondary | ICD-10-CM

## 2021-03-27 DIAGNOSIS — R0609 Other forms of dyspnea: Secondary | ICD-10-CM

## 2021-03-27 DIAGNOSIS — I472 Ventricular tachycardia, unspecified: Secondary | ICD-10-CM | POA: Diagnosis not present

## 2021-03-27 LAB — MAGNESIUM: Magnesium: 2.2 mg/dL (ref 1.6–2.3)

## 2021-03-27 MED ORDER — METOPROLOL SUCCINATE ER 25 MG PO TB24: 25.0000 mg | ORAL_TABLET | Freq: Every day | ORAL | 3 refills | Status: DC

## 2021-03-27 NOTE — Patient Instructions (Addendum)
Medication Instructions:    START TAKING TOPROL XL 25 MG ONCE A DAY    *If you need a refill on your cardiac medications before your next appointment, please call your pharmacy*   Lab Work:  MAG TODAY   If you have labs (blood work) drawn today and your tests are completely normal, you will receive your results only by: MyChart Message (if you have MyChart) OR A paper copy in the mail If you have any lab test that is abnormal or we need to change your treatment, we will call you to review the results.   Testing/Procedures:  NONE ORDERED  TODAY    Follow-Up: At [facility], you and your health needs are our priority.  As part of our continuing mission to provide you with exceptional heart care, we have created designated Provider Care Teams.  These Care Teams include your primary Cardiologist (physician) and Advanced Practice Providers (APPs -  Physician Assistants and Nurse Practitioners) who all work together to provide you with the care you need, when you need it.  We recommend signing up for the patient portal called "MyChart".  Sign up information is provided on this After Visit Summary.  MyChart is used to connect with patients for Virtual Visits (Telemedicine).  Patients are able to view lab/test results, encounter notes, upcoming appointments, etc.  Non-urgent messages can be sent to your provider as well.   To learn more about what you can do with MyChart, go to [email or web address].    Your next appointment:    3-4  week(s)  The format for your next appointment:   In Person  Provider:   Steffanie Dunn, MD    Other Instructions

## 2021-04-08 ENCOUNTER — Encounter: Payer: Medicare PPO | Admitting: Neurology

## 2021-04-08 ENCOUNTER — Ambulatory Visit (INDEPENDENT_AMBULATORY_CARE_PROVIDER_SITE_OTHER): Payer: Medicare PPO | Admitting: Neurology

## 2021-04-08 DIAGNOSIS — R202 Paresthesia of skin: Secondary | ICD-10-CM | POA: Diagnosis not present

## 2021-04-08 DIAGNOSIS — M5416 Radiculopathy, lumbar region: Secondary | ICD-10-CM | POA: Diagnosis not present

## 2021-04-08 DIAGNOSIS — L4050 Arthropathic psoriasis, unspecified: Secondary | ICD-10-CM

## 2021-04-08 DIAGNOSIS — M2550 Pain in unspecified joint: Secondary | ICD-10-CM

## 2021-04-08 DIAGNOSIS — Z0289 Encounter for other administrative examinations: Secondary | ICD-10-CM

## 2021-04-08 DIAGNOSIS — G629 Polyneuropathy, unspecified: Secondary | ICD-10-CM

## 2021-04-08 NOTE — Procedures (Signed)
Full Name: [name] Gender: Female MRN #: [id number] Date of Birth: 02-26-1941    Visit Date: 04/08/2021 09:30 Age: 80 Years Examining Physician: Levert Feinstein, MD  Referring Physician: Melvyn Novas, MD Height: 5 feet 5 inch Patient History: 136lbs History: 80 year old female presented with intermittent bilateral feet paresthesia, intermittent low back pain, and radiating pain to bilateral hip.  Summary of the test: Nerve conduction study: Bilateral sural, superficial peroneal sensory responses were normal.  Bilateral tibial, peroneal to EDB motor responses were normal.  Electromyography:  Selected needle examination of bilateral lower extremity muscles and bilateral lumbosacral paraspinal muscles were normal.  Conclusion: This is a normal study.  There is no electrodiagnostic evidence of large fiber peripheral neuropathy or bilateral lumbosacral radiculopathy.    ------------------------------- Levert Feinstein, M.D. PhD  [facility] [address] [place], [place] [address] Tel: [phone] Fax: [phone]  Verbal informed consent was obtained from the patient, patient was informed of potential risk of procedure, including bruising, bleeding, hematoma formation, infection, muscle weakness, muscle pain, numbness, among others.        MNC    Nerve / Sites Muscle Latency Ref. Amplitude Ref. Rel Amp Segments Distance Velocity Ref. Area    ms ms mV mV %  cm m/s m/s mVms  L Peroneal - EDB     Ankle EDB 4.2 ?6.5 5.0 ?2.0 100 Ankle - EDB 9   20.6     Fib head EDB 10.1  4.8  96.7 Fib head - Ankle 26 45 ?44 19.4     Pop fossa EDB 12.3  3.6  75.3 Pop fossa - Fib head 10 44 ?44 16.5         Pop fossa - Ankle      R Peroneal - EDB     Ankle EDB 4.0 ?6.5 2.5 ?2.0 100 Ankle - EDB 9   9.8     Fib head EDB 9.8  2.3  90.6 Fib head - Ankle 26 44 ?44 10.7     Pop fossa EDB 12.1  2.3  98.5 Pop fossa - Fib head 10 44 ?44 11.8         Pop fossa  - Ankle      L Tibial - AH     Ankle AH 3.2 ?5.8 7.5 ?4.0 100 Ankle - AH 9   18.9     Pop fossa AH 12.3  6.2  82.2 Pop fossa - Ankle 38 42 ?41 17.2  R Tibial - AH     Ankle AH 3.8 ?5.8 4.7 ?4.0 100 Ankle - AH 9   10.8     Pop fossa AH 13.3  3.8  80.8 Pop fossa - Ankle 39 41 ?41 9.1             SNC    Nerve / Sites Rec. Site Peak Lat Ref.  Amp Ref. Segments Distance    ms ms V V  cm  L Sural - Ankle (Calf)     Calf Ankle 3.4 ?4.4 7 ?6 Calf - Ankle 14  R Sural - Ankle (Calf)     Calf Ankle 3.0 ?4.4 7 ?6 Calf - Ankle 14  L Superficial peroneal - Ankle     Lat leg Ankle 3.5 ?4.4 6 ?6 Lat leg - Ankle 14  R Superficial peroneal - Ankle     Lat leg Ankle 3.6 ?4.4 6 ?6 Lat leg - Ankle 14  F  Wave    Nerve F Lat Ref.   ms ms  L Tibial - AH 55.6 ?56.0  R Tibial - AH 55.3 ?56.0         EMG Summary Table    Spontaneous MUAP Recruitment  Muscle IA Fib PSW Fasc Other Amp Dur. Poly Pattern  R. Tibialis posterior Normal None None None _______ Normal Normal Normal Normal  R. Tibialis anterior Normal None None None _______ Normal Normal Normal Normal  R. Peroneus longus Normal None None None _______ Normal Normal Normal Normal  R. Gastrocnemius (Medial head) Normal None None None _______ Normal Normal Normal Normal  R. Vastus lateralis Normal None None None _______ Normal Normal Normal Normal  L. Tibialis anterior Normal None None None _______ Normal Normal Normal Normal  L. Tibialis posterior Normal None None None _______ Normal Normal Normal Normal  L. Peroneus longus Normal None None None _______ Normal Normal Normal Normal  L. Vastus lateralis Normal None None None _______ Normal Normal Normal Normal  L. Gastrocnemius (Medial head) Normal None None None _______ Normal Normal Normal Normal  R. Lumbar paraspinals (low) Normal None None None _______ Normal Normal Normal Normal  R. Lumbar paraspinals (mid) Normal None None None _______ Normal Normal Normal Normal  L. Lumbar  paraspinals (low) Normal None None None _______ Normal Normal Normal Normal  L. Lumbar paraspinals (mid) Normal None None None _______ Normal Normal Normal Normal

## 2021-04-15 DIAGNOSIS — Z20828 Contact with and (suspected) exposure to other viral communicable diseases: Secondary | ICD-10-CM | POA: Diagnosis not present

## 2021-04-16 ENCOUNTER — Encounter: Payer: Self-pay | Admitting: Physician Assistant

## 2021-04-16 ENCOUNTER — Telehealth: Payer: Medicare PPO | Admitting: Physician Assistant

## 2021-04-16 DIAGNOSIS — Z20822 Contact with and (suspected) exposure to covid-19: Secondary | ICD-10-CM | POA: Diagnosis not present

## 2021-04-16 MED ORDER — BENZONATATE 100 MG PO CAPS: 100.0000 mg | ORAL_CAPSULE | Freq: Three times a day (TID) | ORAL | 0 refills | Status: AC

## 2021-04-16 MED ORDER — GUAIFENESIN ER 600 MG PO TB12: 600.0000 mg | ORAL_TABLET | Freq: Two times a day (BID) | ORAL | 0 refills | Status: DC

## 2021-04-16 NOTE — Progress Notes (Signed)
Ms. samaya, boardley are scheduled for a virtual visit with your provider today.    Just as we do with appointments in the office, we must obtain your consent to participate.  Your consent will be active for this visit and any virtual visit you may have with one of our providers in the next 365 days.    If you have a MyChart account, I can also send a copy of this consent to you electronically.  All virtual visits are billed to your insurance company just like a traditional visit in the office.  As this is a virtual visit, video technology does not allow for your provider to perform a traditional examination.  This may limit your provider's ability to fully assess your condition.  If your provider identifies any concerns that need to be evaluated in person or the need to arrange testing such as labs, EKG, etc, we will make arrangements to do so.    Although advances in technology are sophisticated, we cannot ensure that it will always work on either your end or our end.  If the connection with a video visit is poor, we may have to switch to a telephone visit.  With either a video or telephone visit, we are not always able to ensure that we have a secure connection.   I need to obtain your verbal consent now.   Are you willing to proceed with your visit today?   Annalena Piatt Clites has provided verbal consent on 04/16/2021 for a virtual visit (video or telephone).   Karrie Meres, PA-C 04/16/2021  9:16 AM   Date:  04/16/2021   ID:  Gaylyn Lambert Branch, DOB [birth year], MRN [id number]  Patient Location: Home Provider Location: Home Office   Participants: Patient and Provider for Visit and Wrap up  Method of visit: Video  Location of Patient: Home Location of Provider: Home Office Consent was obtain for visit over the video. Services rendered by provider: Visit was performed via video  A video enabled telemedicine application was used and I verified that I am speaking with the correct person  using two identifiers.  PCP:  Myrlene Broker, MD   Chief Complaint:  cough  History of Present Illness:    Alexzia Kasler Narain is a 80 y.o. female with history as stated below. Presents video telehealth for an acute care visit  Pt states her partner recently tested positive for COVID. She started having sxs 4 days ago.   She reports coughing, headache, body aches, rhinorrhea. She is c/o some left upper chest pain that occurred last night and this AM that is worse with breathing. Denies fever, sore throat.  She has tested 4 times for COVID and all the tests have been negative.   Past Medical, Surgical, Social History, Allergies, and Medications have been Reviewed.  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HCC)     dr Vincente Poli, coricare B now resolved   Anemia    iron   Arthritis    psoriatic  in hips oa in hands   Blood transfusion without reported diagnosis 1978   Cataract    chronic headaches    histamine migraines, rarely occur   Chronic kidney disease    hx bladder infections yrs ago   Colon polyp yrs ago   Dysrhythmia 1996 to 1999   wore holter monitor result of accidental fall, occasional irregular pulse   Family history of adverse reaction to anesthesia    father had  aspiration after anesthesia one time   Gastric polyps    GERD (gastroesophageal reflux disease)    History of migraine headaches    Hyperlipidemia    Hypothyroidism    Neuromuscular disease (HCC)    fibromyalgia   Pneumonia 2005aspiration pneumonia   stopped when got pneumonia shot, also 2015 regular pneumonia   PONV (postoperative nausea and vomiting)    patient wishes to have no anesthetics that could affect memory due to past memory issues, no cause found   Psoriatic arthritis (HCC)     Current Meds  Medication Sig   benzonatate (TESSALON) 100 MG capsule Take 1 capsule (100 mg total) by mouth every 8 (eight) hours for 5 days.   guaiFENesin (MUCINEX) 600 MG 12  hr tablet Take 1 tablet (600 mg total) by mouth 2 (two) times daily.     Allergies:   Imitrex [sumatriptan], Nsaids, Ciprofloxacin hcl, and Keflex [cephalexin]   ROS See HPI for history of present illness.  Physical Exam Vitals and nursing note reviewed.  Constitutional:      General: She is not in acute distress.    Appearance: She is well-developed.  HENT:     Head: Normocephalic and atraumatic.  Eyes:     Conjunctiva/sclera: Conjunctivae normal.  Cardiovascular:     Rate and Rhythm: Normal rate.  Pulmonary:     Effort: Pulmonary effort is normal.  Musculoskeletal:        General: Normal range of motion.     Cervical back: Neck supple.  Skin:    General: Skin is warm and dry.  Neurological:     Mental Status: She is alert.            MDM: pt with cough and uri sxs. Consistent testing neg for covid. Is immunosuppressed and would very much benefit from paxlovid if this is just a false negative test. Recommended she retest today and be seen in person. Also expressed my concern about her cp and recommended in person eval for that as well. She is agreeable to f/u today.  There are no diagnoses linked to this encounter.   Time:   Today, I have spent 15 minutes with the patient with telehealth technology discussing the above problems, reviewing the chart, previous notes, medications and orders.    Tests Ordered: No orders of the defined types were placed in this encounter.   Medication Changes: Meds ordered this encounter  Medications   benzonatate (TESSALON) 100 MG capsule    Sig: Take 1 capsule (100 mg total) by mouth every 8 (eight) hours for 5 days.    Dispense:  15 capsule    Refill:  0    Order Specific Question:   Supervising Provider    Answer:   MILLER, BRIAN [[id number]]   guaiFENesin (MUCINEX) 600 MG 12 hr tablet    Sig: Take 1 tablet (600 mg total) by mouth 2 (two) times daily.    Dispense:  6 tablet    Refill:  0    Order Specific Question:   Supervising  Provider    Answer:   Eber Hong [[id number]]     Disposition:  Follow up  Signed, Karrie Meres, PA-C  04/16/2021 9:16 AM

## 2021-04-16 NOTE — Patient Instructions (Signed)
  [name], thank you for joining Cheryl Meres, PA-C for today's virtual visit.  While this provider is not your primary care provider (PCP), if your PCP is located in our provider database this encounter information will be shared with them immediately following your visit.  Consent: (Patient) [name] provided verbal consent for this virtual visit at the beginning of the encounter.  Current Medications:  Current Outpatient Medications:    amiodarone (PACERONE) 200 MG tablet, Take 1 tablet (200 mg total) by mouth daily., Disp: 90 tablet, Rfl: 3   butalbital-aspirin-caffeine (FIORINAL) 50-325-40 MG capsule, Take 1 capsule by mouth every 4 (four) hours as needed for headache., Disp: 30 capsule, Rfl: 0   estradiol (CLIMARA - DOSED IN MG/24 HR) 0.1 mg/24hr patch, Climara 0.1 mg/24 hr transdermal patch, Disp: , Rfl:    fluocinonide (LIDEX) 0.05 % external solution, Apply 1 application topically 2 (two) times daily as needed (for psorasis.). , Disp: , Rfl:    gabapentin (NEURONTIN) 100 MG capsule, Take 3 capsules (300 mg total) by mouth at bedtime., Disp: 90 capsule, Rfl: 5   levothyroxine (SYNTHROID) 50 MCG tablet, Take 1 tablet (50 mcg total) by mouth every morning., Disp: 90 tablet, Rfl: 3   Magnesium Oxide 500 MG TABS, Take 500 mg by mouth at bedtime., Disp: , Rfl:    metoprolol succinate (TOPROL XL) 25 MG 24 hr tablet, Take 1 tablet (25 mg total) by mouth daily., Disp: 90 tablet, Rfl: 3   progesterone (PROMETRIUM) 100 MG capsule, Take 100 mg by mouth at bedtime., Disp: , Rfl:    rosuvastatin (CRESTOR) 5 MG tablet, Take 1 tablet (5 mg total) by mouth daily., Disp: 90 tablet, Rfl: 3   sulfaSALAzine (AZULFIDINE) 500 MG tablet, Take 1,000-1,500 mg by mouth 2 (two) times daily. 1000 mg in the am and 1500 mg at night, Disp: , Rfl:    TESTOSTERONE PROPIONATE IJ, Place 1 application onto the skin 3 (three) times a week., Disp: , Rfl:    VITAMIN D PO, Take 1,000 mg by mouth  daily., Disp: , Rfl:    Medications ordered in this encounter:  No orders of the defined types were placed in this encounter.    *If you need refills on other medications prior to your next appointment, please contact your pharmacy*  Follow-Up: Call back or seek an in-person evaluation if the symptoms worsen or if the condition fails to improve as anticipated.  Other Instructions Please seek care in person and get retested for COVID   If you have been instructed to have an in-person evaluation today at a local Urgent Care facility, please use the link below. It will take you to a list of all of our available [facility], including address, phone number and hours of operation. Please do not delay care.  [facility]  If you or a family member do not have a primary care provider, use the link below to schedule a visit and establish care. When you choose a [place] primary care physician or advanced practice provider, you gain a long-term partner in health. Find a Primary Care Provider  Learn more about [facility] in-office and virtual care options:  - Get Care Now

## 2021-04-18 ENCOUNTER — Ambulatory Visit (HOSPITAL_COMMUNITY)
Admission: EM | Admit: 2021-04-18 | Discharge: 2021-04-18 | Disposition: A | Discharge status: Home / Self Care | Payer: Medicare PPO

## 2021-04-18 ENCOUNTER — Other Ambulatory Visit: Payer: Self-pay

## 2021-04-18 ENCOUNTER — Emergency Department (HOSPITAL_COMMUNITY)
Admission: EM | Admit: 2021-04-18 | Discharge: 2021-04-18 | Disposition: A | Discharge status: Home / Self Care | Payer: Medicare PPO | Attending: Emergency Medicine | Admitting: Emergency Medicine

## 2021-04-18 DIAGNOSIS — Z79899 Other long term (current) drug therapy: Secondary | ICD-10-CM | POA: Diagnosis not present

## 2021-04-18 DIAGNOSIS — Z8616 Personal history of COVID-19: Secondary | ICD-10-CM | POA: Insufficient documentation

## 2021-04-18 DIAGNOSIS — N189 Chronic kidney disease, unspecified: Secondary | ICD-10-CM | POA: Diagnosis not present

## 2021-04-18 DIAGNOSIS — Z96641 Presence of right artificial hip joint: Secondary | ICD-10-CM | POA: Insufficient documentation

## 2021-04-18 DIAGNOSIS — U071 COVID-19: Secondary | ICD-10-CM | POA: Insufficient documentation

## 2021-04-18 DIAGNOSIS — R059 Cough, unspecified: Secondary | ICD-10-CM | POA: Diagnosis present

## 2021-04-18 DIAGNOSIS — E039 Hypothyroidism, unspecified: Secondary | ICD-10-CM | POA: Insufficient documentation

## 2021-04-18 NOTE — ED Notes (Addendum)
O2 between 97 - 100 while ambulating with good pleth.

## 2021-04-18 NOTE — ED Triage Notes (Addendum)
Patient reports covid symptoms started Monday. Felt better on thanksgiving day, but coughed all day long yesterday., tested positive for covid today, says her o2 sats were 91-93 this morning at home. Patient is concerned as she is on two immunosuppressant drugs. Denies sputum with cough, denies fever. Denies shobr, denies difficulty breathing, patient is concerned about pneumonia, says she is very susceptible to pneumonia.

## 2021-04-18 NOTE — ED Provider Notes (Signed)
[facility]-EMERGENCY DEPT Provider Note   CSN: [id number] Arrival date & time: 04/18/21  1350     History Chief Complaint  Patient presents with   covid    [name] is a 80 y.o. female on set of symptoms 5 days ago, with dry cough, fatigue, mild nasal congestion.  Denies any shortness of breath, chest pain, fevers, abdominal pain, nausea, vomiting, diarrhea.  Patient is immunocompromised secondary to medications for psoriatic arthritis. Vaccinated x 3 for COVID-19, sick contact with significant other.  She is very concerned for possible pneumonia.  She did test positive for COVID-19 yesterday after testing negative for 4 days prior.  I personally read this patient medical history is history of psoriatic arthritis, adrenal gland dysfunction, hypothyroidism.  She is not anticoagulated.  She is on sulfasalazine and hydroxychloroquine per patient.  HPI     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HCC)     dr Vincente Poli, coricare B now resolved   Anemia    iron   Arthritis    psoriatic  in hips oa in hands   Blood transfusion without reported diagnosis 1978   Cataract    chronic headaches    histamine migraines, rarely occur   Chronic kidney disease    hx bladder infections yrs ago   Colon polyp yrs ago   Dysrhythmia 1996 to 1999   wore holter monitor result of accidental fall, occasional irregular pulse   Family history of adverse reaction to anesthesia    father had aspiration after anesthesia one time   Gastric polyps    GERD (gastroesophageal reflux disease)    History of migraine headaches    Hyperlipidemia    Hypothyroidism    Neuromuscular disease (HCC)    fibromyalgia   Pneumonia 2005aspiration pneumonia   stopped when got pneumonia shot, also 2015 regular pneumonia   PONV (postoperative nausea and vomiting)    patient wishes to have no anesthetics that could affect memory due to past memory issues, no  cause found   Psoriatic arthritis [facility])     Patient Active Problem List   Diagnosis Date Noted   Hypothyroidism 03/05/2021   Migraine 03/05/2021   Hyperlipidemia 10/16/2020   History of COVID-19 10/14/2020   Femoral nerve lesion, right 02/16/2018   OA (osteoarthritis) of hip 07/06/2017   Cervical disc disorder with radiculopathy of cervical region 03/16/2016   Contracture of joint of right shoulder region 03/16/2016   DDD (degenerative disc disease), cervical 12/01/2015   MCI (mild cognitive impairment) 12/01/2015   Psoriatic arthritis (HCC) 11/08/2013   Adrenal gland dysfunction (HCC)     Past Surgical History:  Procedure Laterality Date   ABDOMINAL HYSTERECTOMY     left ovary left and tube left   APPENDECTOMY     with hysterectomy   CARDIAC CATHETERIZATION     CATARACT EXTRACTION, BILATERAL     CHOLECYSTECTOMY     COLONOSCOPY     FINGER SURGERY     middle finger right hand   INCONTINENCE SURGERY     bladder sling   RECTOCELE REPAIR     RHINOPLASTY     SHOULDER ARTHROSCOPY WITH ROTATOR CUFF REPAIR AND SUBACROMIAL DECOMPRESSION Right 06/17/2015   Procedure: SHOULDER DIAGNOSTIC OPERATIVE ARTHROSCOPY WITH MINI-OPEN ROTATOR CUFF REPAIR AND SUBACROMIAL DECOMPRESSION;  Surgeon: Cammy Copa, MD;  [facility]: [facility] OR;  Service: Orthopedics;  Laterality: Right;   TONSILLECTOMY  age 27   adenoids also   TOTAL HIP ARTHROPLASTY  Right 07/06/2017   Procedure: RIGHT TOTAL HIP ARTHROPLASTY ANTERIOR APPROACH;  Surgeon: Ollen Gross, MD;  Location: [facility] ORS;  Service: Orthopedics;  Laterality: Right;     OB History   No obstetric history on file.     Family History  Problem Relation Age of Onset   Esophageal cancer Father    Stomach cancer Father    Hypertension Father    Colon cancer Mother    Breast cancer Mother    Depression Mother    Anxiety disorder Mother    Heart attack Paternal Grandfather    Diabetes Paternal Grandmother    Liver cancer Maternal Grandmother     Rectal cancer Neg Hx     Social History   Tobacco Use   Smoking status: Never   Smokeless tobacco: Never  Vaping Use   Vaping Use: Never used  Substance Use Topics   Alcohol use: Yes    Alcohol/week: 0.0 standard drinks    Comment: 2 ounce red wine occ   Drug use: No    Home Medications Prior to Admission medications   Medication Sig Start Date End Date Taking? Authorizing Provider  amiodarone (PACERONE) 200 MG tablet Take 1 tablet (200 mg total) by mouth daily. 03/06/21   Lanier Prude, MD  benzonatate (TESSALON) 100 MG capsule Take 1 capsule (100 mg total) by mouth every 8 (eight) hours for 5 days. 04/16/21 04/21/21  Couture, Cortni S, PA-C  butalbital-aspirin-caffeine (FIORINAL) 50-325-40 MG capsule Take 1 capsule by mouth every 4 (four) hours as needed for headache. 03/05/21   Myrlene Broker, MD  estradiol (CLIMARA - DOSED IN MG/24 HR) 0.1 mg/24hr patch Climara 0.1 mg/24 hr transdermal patch    [provider]  fluocinonide (LIDEX) 0.05 % external solution Apply 1 application topically 2 (two) times daily as needed (for psorasis.).     [provider]  gabapentin (NEURONTIN) 100 MG capsule Take 3 capsules (300 mg total) by mouth at bedtime. 02/19/19   Dohmeier, Porfirio Mylar, MD  guaiFENesin (MUCINEX) 600 MG 12 hr tablet Take 1 tablet (600 mg total) by mouth 2 (two) times daily. 04/16/21   Couture, Cortni S, PA-C  levothyroxine (SYNTHROID) 50 MCG tablet Take 1 tablet (50 mcg total) by mouth every morning. 03/05/21   Myrlene Broker, MD  Magnesium Oxide 500 MG TABS Take 500 mg by mouth at bedtime.    [provider]  metoprolol succinate (TOPROL XL) 25 MG 24 hr tablet Take 1 tablet (25 mg total) by mouth daily. 03/27/21   Sheilah Pigeon, PA-C  progesterone (PROMETRIUM) 100 MG capsule Take 100 mg by mouth at bedtime.    [provider]  rosuvastatin (CRESTOR) 5 MG tablet Take 1 tablet (5 mg total) by mouth daily. 03/05/21   Myrlene Broker, MD  sulfaSALAzine (AZULFIDINE) 500 MG tablet Take 1,000-1,500 mg by mouth 2 (two) times daily. 1000 mg in the am and 1500 mg at night    [provider]  TESTOSTERONE PROPIONATE IJ Place 1 application onto the skin 3 (three) times a week.    [provider]  VITAMIN D PO Take 1,000 mg by mouth daily.    [provider]    Allergies    Imitrex [sumatriptan], Nsaids, Ciprofloxacin hcl, and Keflex [cephalexin]  Review of Systems   Review of Systems  Constitutional:  Positive for appetite change and fatigue. Negative for activity change, chills and fever.  HENT:  Positive for congestion, postnasal drip and rhinorrhea.  Respiratory:  Positive for cough. Negative for choking, chest tightness and shortness of breath.   Cardiovascular: Negative.   Gastrointestinal: Negative.   Genitourinary: Negative.   Musculoskeletal: Negative.   Skin: Negative.   Neurological: Negative.    Physical Exam Updated Vital Signs BP 122/71 (BP Location: Left Arm)   Pulse 66   Temp 97.7 F (36.5 C) (Oral)   Ht 5' 5.5" (1.664 m)   Wt 60.8 kg   SpO2 97%   BMI 21.96 kg/m   Physical Exam Vitals and nursing note reviewed.  Constitutional:      Appearance: She is not ill-appearing or toxic-appearing.  HENT:     Head: Normocephalic and atraumatic.     Nose: Nose normal. No congestion.     Mouth/Throat:     Mouth: Mucous membranes are moist.     Pharynx: Oropharynx is clear. No oropharyngeal exudate or posterior oropharyngeal erythema.  Eyes:     General:        Right eye: No discharge.        Left eye: No discharge.     Extraocular Movements: Extraocular movements intact.     Conjunctiva/sclera: Conjunctivae normal.     Pupils: Pupils are equal, round, and reactive to light.  Cardiovascular:     Rate and Rhythm: Normal rate and regular rhythm.     Pulses: Normal pulses.     Heart sounds: Normal heart sounds. No murmur heard. Pulmonary:     Effort: Pulmonary  effort is normal. No tachypnea, bradypnea, accessory muscle usage, prolonged expiration or respiratory distress.     Breath sounds: Normal breath sounds. No wheezing or rales.  Abdominal:     General: Bowel sounds are normal. There is no distension.     Palpations: Abdomen is soft.     Tenderness: There is no abdominal tenderness.  Musculoskeletal:        General: No deformity.     Cervical back: Neck supple.     Right lower leg: No edema.     Left lower leg: No edema.  Lymphadenopathy:     Cervical: No cervical adenopathy.  Skin:    General: Skin is warm and dry.     Capillary Refill: Capillary refill takes less than 2 seconds.  Neurological:     General: No focal deficit present.     Mental Status: She is alert and oriented to person, place, and time. Mental status is at baseline.  Psychiatric:        Mood and Affect: Mood normal.    ED Results / Procedures / Treatments   Labs (all labs ordered are listed, but only abnormal results are displayed) Labs Reviewed - No data to display  EKG None  Radiology No results found.  Procedures Procedures   Medications Ordered in ED Medications - No data to display  ED Course  I have reviewed the triage vital signs and the nursing notes.  Pertinent labs & imaging results that were available during my care of the patient were reviewed by me and considered in my medical decision making (see chart for details).    MDM Rules/Calculators/A&P                         80 year old female presents with concern for testing positive for COVID-19 with mild cough for the last 5 days.  Vital signs are normal intake.  Cardiopulmonary exam is normal, abdominal exam is benign.  Patient is neurovascular intact in all  4 extremities and very well-appearing.  She is outside the window of antiviral therapy.  Additionally given reassuring physical exam, vital signs, and normal oxygen saturation with ambulation in the emergency department, do not feel  any further work-up is warranted in the ER at this time.  Lung sounds are clear bilaterally therefore no indication for chest x-ray at this time.  Recommend close outpatient PCP follow-up.  Mary voiced understanding of her medical evaluation and treatment plan.  Each of her questions were answered to her expressed satisfaction.  Return precautions are given.  Patient is well-appearing, stable, improved for discharge at this time.  This chart was dictated using voice recognition software, Dragon. Despite the best efforts of this provider to proofread and correct errors, errors may still occur which can change documentation meaning.  Julann Mcgilvray Nicasio was evaluated in Emergency Department on 04/18/2021 for the symptoms described in the history of present illness. She was evaluated in the context of the global COVID-19 pandemic, which necessitated consideration that the patient might be at risk for infection with the SARS-CoV-2 virus that causes COVID-19. Institutional protocols and algorithms that pertain to the evaluation of patients at risk for COVID-19 are in a state of rapid change based on information released by regulatory bodies including the CDC and federal and state organizations. These policies and algorithms were followed during the patient's care in the ED.  Final Clinical Impression(s) / ED Diagnoses Final diagnoses:  COVID-19    Rx / DC Orders ED Discharge Orders     None        Sherrilee Gilles 04/18/21 2232    Mancel Bale, MD 04/19/21 1346

## 2021-04-18 NOTE — Discharge Instructions (Addendum)
YourYou were seen in the ER today for evaluation with a COVID-19 infection.  Your physical exam, vital signs, were very reassuring.

## 2021-04-23 ENCOUNTER — Other Ambulatory Visit: Payer: Medicare PPO

## 2021-04-29 NOTE — Progress Notes (Signed)
Electrophysiology Office Follow up Visit Note:    Date:  04/30/2021   ID:  [name], DOB 09-30-1940, MRN [id number]  PCP:  Myrlene Broker, MD  [facility] Cardiologist:  None  [facility] Electrophysiologist:  Lanier Prude, MD    Interval History:    [name] is a 80 y.o. female who presents for a follow up visit. They were last seen in clinic March 06, 2021 for PVCs.  Low-dose amiodarone was started at that visit. Repeat ZIO monitor was ordered but has not been completed. Unfortunately, she did not tolerate amiodarone.  She was started on low-dose beta-blocker by [facility] on November 4.  She continues to have some PVCs with a sound fairly rare.  Last few months the tend to occur more at nighttime.  She is tolerating the metoprolol without off target effects.  She is planning on traveling around the holidays.     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HCC)     dr Vincente Poli, coricare B now resolved   Anemia    iron   Arthritis    psoriatic  in hips oa in hands   Blood transfusion without reported diagnosis 1978   Cataract    chronic headaches    histamine migraines, rarely occur   Chronic kidney disease    hx bladder infections yrs ago   Colon polyp yrs ago   Dysrhythmia 1996 to 1999   wore holter monitor result of accidental fall, occasional irregular pulse   Family history of adverse reaction to anesthesia    father had aspiration after anesthesia one time   Gastric polyps    GERD (gastroesophageal reflux disease)    History of migraine headaches    Hyperlipidemia    Hypothyroidism    Neuromuscular disease (HCC)    fibromyalgia   Pneumonia 2005aspiration pneumonia   stopped when got pneumonia shot, also 2015 regular pneumonia   PONV (postoperative nausea and vomiting)    patient wishes to have no anesthetics that could affect memory due to past memory issues, no cause found   Psoriatic arthritis (HCC)      Past Surgical History:  Procedure Laterality Date   ABDOMINAL HYSTERECTOMY     left ovary left and tube left   APPENDECTOMY     with hysterectomy   CARDIAC CATHETERIZATION     CATARACT EXTRACTION, BILATERAL     CHOLECYSTECTOMY     COLONOSCOPY     FINGER SURGERY     middle finger right hand   INCONTINENCE SURGERY     bladder sling   RECTOCELE REPAIR     RHINOPLASTY     SHOULDER ARTHROSCOPY WITH ROTATOR CUFF REPAIR AND SUBACROMIAL DECOMPRESSION Right 06/17/2015   Procedure: SHOULDER DIAGNOSTIC OPERATIVE ARTHROSCOPY WITH MINI-OPEN ROTATOR CUFF REPAIR AND SUBACROMIAL DECOMPRESSION;  Surgeon: Cammy Copa, MD;  Location: [facility] OR;  Service: Orthopedics;  Laterality: Right;   TONSILLECTOMY  age 61   adenoids also   TOTAL HIP ARTHROPLASTY Right 07/06/2017   Procedure: RIGHT TOTAL HIP ARTHROPLASTY ANTERIOR APPROACH;  Surgeon: Ollen Gross, MD;  Location: [facility];  Service: Orthopedics;  Laterality: Right;    Current Medications: Current Meds  Medication Sig   butalbital-aspirin-caffeine (FIORINAL) 50-325-40 MG capsule Take 1 capsule by mouth every 4 (four) hours as needed for headache.   estradiol (CLIMARA - DOSED IN MG/24 HR) 0.1 mg/24hr patch Climara 0.1 mg/24 hr transdermal patch   fluocinonide (LIDEX) 0.05 % external solution Apply  1 application topically 2 (two) times daily as needed (for psorasis.).    gabapentin (NEURONTIN) 100 MG capsule Take 3 capsules (300 mg total) by mouth at bedtime.   levothyroxine (SYNTHROID) 50 MCG tablet Take 1 tablet (50 mcg total) by mouth every morning.   Magnesium Oxide 500 MG TABS Take 500 mg by mouth at bedtime.   metoprolol succinate (TOPROL XL) 25 MG 24 hr tablet Take 1 tablet (25 mg total) by mouth daily.   progesterone (PROMETRIUM) 100 MG capsule Take 100 mg by mouth at bedtime.   rosuvastatin (CRESTOR) 5 MG tablet Take 1 tablet (5 mg total) by mouth daily.   sulfaSALAzine (AZULFIDINE) 500 MG tablet Take 1,000-1,500 mg by mouth 2 (two)  times daily. 1000 mg in the am and 1500 mg at night   TESTOSTERONE PROPIONATE IJ Place 1 application onto the skin 3 (three) times a week.   VITAMIN D PO Take 1,000 mg by mouth daily.     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   Patient drinks two cups of coffee daily.   Patient is right-handed.   Social Determinants of Health   Financial Resource Strain: Not on file  Food Insecurity: Not on file  Transportation Needs: Not on file  Physical Activity: Not on file  Stress: Not on file  Social Connections: Not on file     Family History: The patient's family history includes Anxiety disorder in her mother; Breast cancer in her mother; Colon cancer in her mother; Depression in her mother; Diabetes in her paternal grandmother; Esophageal cancer in her father; Heart attack in her paternal grandfather; Hypertension in her father; Liver cancer in her maternal grandmother; Stomach cancer in her father. There is no history of Rectal cancer.  ROS:   Please see the history of present illness.    All other systems reviewed and are negative.  EKGs/Labs/Other Studies Reviewed:    The following studies were  reviewed today:   EKG:  The ekg ordered today demonstrates sinus rhythm.  No PVCs.  Recent Labs: 03/05/2021: ALT 14; BUN 13; Creatinine, Ser 0.68; Hemoglobin 13.4; Platelets 132.0; Potassium 4.3; Sodium 141; TSH 1.75 03/27/2021: Magnesium 2.2  Recent Lipid Panel    Component Value Date/Time   CHOL 167 03/05/2021 1128   TRIG 52.0 03/05/2021 1128   HDL 70.80 03/05/2021 1128   CHOLHDL 2 03/05/2021 1128   VLDL 10.4 03/05/2021 1128   LDLCALC 86 03/05/2021 1128    Physical Exam:    VS:  BP 120/62   Pulse 70   Ht 5' 5.5" (1.664 m)   Wt 134 lb (  60.8 kg)   SpO2 98%   BMI 21.96 kg/m     Wt Readings from Last 3 Encounters:  04/30/21 134 lb (60.8 kg)  04/18/21 134 lb (60.8 kg)  03/27/21 136 lb (61.7 kg)     GEN:  Well nourished, well developed in no acute distress HEENT: Normal NECK: No JVD; No carotid bruits LYMPHATICS: No lymphadenopathy CARDIAC: RRR, no murmurs, rubs, gallops RESPIRATORY:  Clear to auscultation without rales, wheezing or rhonchi  ABDOMEN: Soft, non-tender, non-distended MUSCULOSKELETAL:  No edema; No deformity  SKIN: Warm and dry NEUROLOGIC:  Alert and oriented x 3 PSYCHIATRIC:  Normal affect        ASSESSMENT:    1. Frequent PVCs   2. VT (ventricular tachycardia)    PLAN:    In order of problems listed above:  #Frequent symptomatic PVCs No PVCs today.  I think the burden of her PVCs has decreased on metoprolol.  We will recheck a 2-week ZIO monitor to quantify her PVC burden.  I will have her continue the metoprolol succinate.  I will have her touch base with an APP in clinic in 6 months to see how she is doing.    Total time spent with patient today 20 minutes. This includes reviewing records, evaluating the patient and coordinating care.   Medication Adjustments/Labs and Tests Ordered: Current medicines are reviewed at length with the patient today.  Concerns regarding medicines are outlined above.  No orders of the defined types were  placed in this encounter.  No orders of the defined types were placed in this encounter.    Signed, Steffanie Dunn, MD, [facility], [facility] 04/30/2021 9:23 AM    Electrophysiology [facility]

## 2021-04-30 ENCOUNTER — Encounter: Payer: Self-pay | Admitting: Cardiology

## 2021-04-30 ENCOUNTER — Ambulatory Visit: Payer: Medicare PPO | Admitting: Cardiology

## 2021-04-30 ENCOUNTER — Other Ambulatory Visit: Payer: Self-pay

## 2021-04-30 ENCOUNTER — Ambulatory Visit: Payer: Medicare PPO

## 2021-04-30 VITALS — BP 120/62 | HR 70 | Ht 65.5 in | Wt 134.0 lb

## 2021-04-30 DIAGNOSIS — I472 Ventricular tachycardia, unspecified: Secondary | ICD-10-CM

## 2021-04-30 DIAGNOSIS — I493 Ventricular premature depolarization: Secondary | ICD-10-CM

## 2021-04-30 MED ORDER — METOPROLOL SUCCINATE ER 25 MG PO TB24: 25.0000 mg | ORAL_TABLET | Freq: Every day | ORAL | 3 refills | Status: DC

## 2021-04-30 NOTE — Progress Notes (Unsigned)
Enrolled for [employer] to mail a ZIO XT long term holter monitor to the patients address on file.  

## 2021-04-30 NOTE — Patient Instructions (Addendum)
Medication Instructions:  Your physician recommends that you continue on your current medications as directed. Please refer to the Current Medication list given to you today. *If you need a refill on your cardiac medications before your next appointment, please call your pharmacy*  Lab Work: None. If you have labs (blood work) drawn today and your tests are completely normal, you will receive your results only by: MyChart Message (if you have MyChart) OR A paper copy in the mail If you have any lab test that is abnormal or we need to change your treatment, we will call you to review the results.  Testing/Procedures: Your physician has recommended that you wear a heart monitor. Heart monitors are medical devices that record the heart's electrical activity. Doctors most often use these monitors to diagnose arrhythmias. Arrhythmias are problems with the speed or rhythm of the heartbeat. The monitor is a small, portable device. You can wear one while you do your normal daily activities. This is usually used to diagnose what is causing palpitations/syncope (passing out).   Follow-Up: At [facility], you and your health needs are our priority.  As part of our continuing mission to provide you with exceptional heart care, we have created designated Provider Care Teams.  These Care Teams include your primary Cardiologist (physician) and Advanced Practice Providers (APPs -  Physician Assistants and Nurse Practitioners) who all work together to provide you with the care you need, when you need it.  Your physician wants you to follow-up in: 6 months with   one of the following Advanced Practice Providers on your designated Care Team:    Francis Dowse, [place] Casimiro Needle "Mardelle Matte" [place], [place]   You will receive a reminder letter in the mail two months in advance. If you don't receive a letter, please call our office to schedule the follow-up appointment.  We recommend signing up for the patient portal called  "MyChart".  Sign up information is provided on this After Visit Summary.  MyChart is used to connect with patients for Virtual Visits (Telemedicine).  Patients are able to view lab/test results, encounter notes, upcoming appointments, etc.  Non-urgent messages can be sent to your provider as well.   To learn more about what you can do with MyChart, go to [email or web address].    Any Other Special Instructions Will Be Listed Below (If Applicable).  ZIO XT- Long Term Monitor Instructions  Your physician has requested you wear a ZIO patch monitor for 14 days.  This is a single patch monitor. [employer] supplies one patch monitor per enrollment. Additional stickers are not available. Please do not apply patch if you will be having a Nuclear Stress Test,  Echocardiogram, Cardiac CT, MRI, or Chest Xray during the period you would be wearing the  monitor. The patch cannot be worn during these tests. You cannot remove and re-apply the  ZIO XT patch monitor.  Your ZIO patch monitor will be mailed 3 day USPS to your address on file. It may take 3-5 days  to receive your monitor after you have been enrolled.  Once you have received your monitor, please review the enclosed instructions. Your monitor  has already been registered assigning a specific monitor serial # to you.  Billing and Patient Assistance Program Information  We have supplied [employer] with any of your insurance information on file for billing purposes. [employer] offers a sliding scale Patient Assistance Program for patients that do not have  insurance, or whose insurance does not completely cover the cost of  ZIO monitor.  °You must apply for the Patient Assistance Program to qualify for this discounted rate.  °To apply, please call [employer] at [phone], select option 4, select option 2, ask to apply for  °Patient Assistance Program. [employer] will ask your household income, and how many people  °are in your household. They will quote your  out-of-pocket cost based on that information.  °[employer] will also be able to set up a 12-month, interest-free payment plan if needed. ° °Applying the monitor °  °Shave hair from upper left chest.  °Hold abrader disc by orange tab. Rub abrader in 40 strokes over the upper left chest as  °indicated in your monitor instructions.  °Clean area with 4 enclosed alcohol pads. Let dry.  °Apply patch as indicated in monitor instructions. Patch will be placed under collarbone on left  °side of chest with arrow pointing upward.  °Rub patch adhesive wings for 2 minutes. Remove white label marked "1". Remove the white  °label marked "2". Rub patch adhesive wings for 2 additional minutes.  °While looking in a mirror, press and release button in center of patch. A small green light will  °flash 3-4 times. This will be your only indicator that the monitor has been turned on.  °Do not shower for the first 24 hours. You may shower after the first 24 hours.  °Press the button if you feel a symptom. You will hear a small click. Record Date, Time and  °Symptom in the Patient Logbook.  °When you are ready to remove the patch, follow instructions on the last 2 pages of Patient  °Logbook. Stick patch monitor onto the last page of Patient Logbook.  °Place Patient Logbook in the blue and white box. Use locking tab on box and tape box closed  °securely. The blue and white box has prepaid postage on it. Please place it in the mailbox as  °soon as possible. Your physician should have your test results approximately 7 days after the  °monitor has been mailed back to [employer].  °Call [employer] Customer Care at [phone] if you have questions regarding  °your ZIO XT patch monitor. Call them immediately if you see an orange light blinking on your  °monitor.  °If your monitor falls off in less than 4 days, contact our Monitor department at [phone].  °If your monitor becomes loose or falls off after 4 days call [employer] at  [phone] for  °suggestions on securing your monitor ° ° ° ° °  ° ° °

## 2021-05-01 ENCOUNTER — Encounter: Payer: Self-pay | Admitting: Internal Medicine

## 2021-05-01 DIAGNOSIS — Z1231 Encounter for screening mammogram for malignant neoplasm of breast: Secondary | ICD-10-CM | POA: Diagnosis not present

## 2021-05-07 ENCOUNTER — Encounter: Payer: Medicare PPO | Admitting: Diagnostic Neuroimaging

## 2021-05-26 ENCOUNTER — Ambulatory Visit: Payer: Medicare PPO | Admitting: Cardiovascular Disease

## 2021-05-29 DIAGNOSIS — I493 Ventricular premature depolarization: Secondary | ICD-10-CM | POA: Diagnosis not present

## 2021-06-04 DIAGNOSIS — H04123 Dry eye syndrome of bilateral lacrimal glands: Secondary | ICD-10-CM | POA: Diagnosis not present

## 2021-06-04 DIAGNOSIS — Z79899 Other long term (current) drug therapy: Secondary | ICD-10-CM | POA: Diagnosis not present

## 2021-06-04 DIAGNOSIS — M456 Ankylosing spondylitis lumbar region: Secondary | ICD-10-CM | POA: Diagnosis not present

## 2021-06-04 DIAGNOSIS — D3131 Benign neoplasm of right choroid: Secondary | ICD-10-CM | POA: Diagnosis not present

## 2021-06-17 DIAGNOSIS — I493 Ventricular premature depolarization: Secondary | ICD-10-CM | POA: Diagnosis not present

## 2021-06-29 ENCOUNTER — Ambulatory Visit: Payer: Medicare PPO | Admitting: Physician Assistant

## 2021-08-04 ENCOUNTER — Ambulatory Visit (INDEPENDENT_AMBULATORY_CARE_PROVIDER_SITE_OTHER): Payer: Medicare PPO | Admitting: Cardiovascular Disease

## 2021-08-04 ENCOUNTER — Encounter: Payer: Self-pay | Admitting: Cardiovascular Disease

## 2021-08-04 ENCOUNTER — Other Ambulatory Visit: Payer: Self-pay

## 2021-08-04 DIAGNOSIS — E782 Mixed hyperlipidemia: Secondary | ICD-10-CM

## 2021-08-04 DIAGNOSIS — I493 Ventricular premature depolarization: Secondary | ICD-10-CM | POA: Diagnosis not present

## 2021-08-04 MED ORDER — METOPROLOL SUCCINATE ER 25 MG PO TB24: 12.5000 mg | ORAL_TABLET | Freq: Every day | ORAL | 3 refills | Status: DC

## 2021-08-04 NOTE — Assessment & Plan Note (Signed)
History of frequent PVCs on event monitoring.  I did refer her to Dr. Lalla Brothers who began her empirically on low-dose amiodarone which she did not tolerate.  He then placed her on a beta-blocker which markedly improved the frequency of her PVCs by follow-up event monitoring although she complains of fatigue and not sleeping well on the medication.  I am going to cut her metoprolol in half to 12.5 mg a day. ?

## 2021-08-04 NOTE — Patient Instructions (Signed)
Medication Instructions:  ? ?-Decrease metoprolol succinate (toprol-xl) 12.5mg  once daily. ? ?*If you need a refill on your cardiac medications before your next appointment, please call your pharmacy* ? ? ?Follow-Up: ?At [facility], you and your health needs are our priority.  As part of our continuing mission to provide you with exceptional heart care, we have created designated Provider Care Teams.  These Care Teams include your primary Cardiologist (physician) and Advanced Practice Providers (APPs -  Physician Assistants and Nurse Practitioners) who all work together to provide you with the care you need, when you need it. ? ?We recommend signing up for the patient portal called "MyChart".  Sign up information is provided on this After Visit Summary.  MyChart is used to connect with patients for Virtual Visits (Telemedicine).  Patients are able to view lab/test results, encounter notes, upcoming appointments, etc.  Non-urgent messages can be sent to your provider as well.   ?To learn more about what you can do with MyChart, go to [email or web address].   ? ?Your next appointment:   ?4 month(s) ? ?The format for your next appointment:   ?In Person ? ?Provider:   ?Quay Burow, MD ? ?

## 2021-08-04 NOTE — Progress Notes (Signed)
? ? ? ?08/04/2021 ?[name]   ?[birth year]  ?[id number] ? ?Primary Physician Myrlene Broker, MD ?Primary Cardiologist: [name] MD Nicholes Calamity, [place] ? ?HPI:  [name] is a 81 y.o.  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 Vincente Poli , her OB/GYN, for progressive dyspnea.  I last saw her in the office 01/27/2021.  Her only risk factor history of hyperlipidemia.  She is never smoked.  There is no family history.  She is never had a heart attack or stroke.  She is had a right total hip replacement and has difficulties with her left hip scheduled to see Dr. Lequita Halt in the near future.  Since January he has had progressive dyspnea.  She did recently have COVID and was treated with Paxlovid as an outpatient. ? ?I obtained a 2D echocardiogram on her 11/13/2020 which was normal.  A Myoview stress test showed no ischemia but that showed nonsustained ventricular tachycardia.  A coronary calcium score was low at 14.  I did refer her to Dr. Lalla Brothers, electrophysiology, who empirically begun her on low-dose amiodarone which she did not tolerate.  She is currently on metoprolol 25 mg a day which she says is affecting her sleep adversely as well as causing fatigue.  A follow-up event monitor performed by Dr. Lalla Brothers 06/19/2021 showed only rare PVCs. ?  ? ? ?Current Meds  ?Medication Sig  ? butalbital-aspirin-caffeine (FIORINAL) 50-325-40 MG capsule Take 1 capsule by mouth every 4 (four) hours as needed for headache.  ? estradiol (CLIMARA - DOSED IN MG/24 HR) 0.1 mg/24hr patch Climara 0.1 mg/24 hr transdermal patch  ? fluocinonide (LIDEX) 0.05 % external solution Apply 1 application topically 2 (two) times daily as needed (for psorasis.).   ? gabapentin (NEURONTIN) 100 MG capsule Take 3 capsules (300 mg total) by mouth at bedtime.  ? levothyroxine (SYNTHROID) 50 MCG  tablet Take 1 tablet (50 mcg total) by mouth every morning.  ? Magnesium Oxide 500 MG TABS Take 500 mg by mouth at bedtime.  ? progesterone (PROMETRIUM) 100 MG capsule Take 100 mg by mouth at bedtime.  ? rosuvastatin (CRESTOR) 5 MG tablet Take 1 tablet (5 mg total) by mouth daily.  ? sulfaSALAzine (AZULFIDINE) 500 MG tablet Take 1,000-1,500 mg by mouth 2 (two) times daily. 1000 mg in the am and 1500 mg at night  ? TESTOSTERONE PROPIONATE IJ Place 1 application onto the skin 3 (three) times a week.  ? VITAMIN D PO Take 1,000 mg by mouth daily.  ? [DISCONTINUED] guaiFENesin (MUCINEX) 600 MG 12 hr tablet Take 1 tablet (600 mg total) by mouth 2 (two) times daily.  ? [DISCONTINUED] metoprolol succinate (TOPROL XL) 25 MG 24 hr tablet Take 1 tablet (25 mg total) by mouth daily.  ?  ? ?Allergies  ?Allergen Reactions  ? Imitrex [Sumatriptan] Nausea And Vomiting and Hypertension  ? Nsaids Other (See Comments)  ?  Genella Rife as result taking.  ? Ciprofloxacin Hcl   ? Keflex [Cephalexin] Nausea And Vomiting  ? ? ?Social History  ? ?Socioeconomic History  ? Marital status: Widowed  ?  Spouse name: [name]  ? Number of children: 2  ? Years of education: Masters  ? Highest education level: Not on file  ?Occupational History  ? 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  Employer: [employer]  ?Tobacco Use  ? Smoking status: Never  ?  Smokeless tobacco: Never  ?Vaping Use  ? Vaping Use: Never used  ?Substance and Sexual Activity  ? Alcohol use: Yes  ?  Alcohol/week: 0.0 standard drinks  ?  Comment: 2 ounce red wine occ  ? Drug use: No  ? Sexual activity: Not on file  ?Other Topics Concern  ? Not on file  ?Social History Narrative  ? Patient is married [name]) and lives at home with her husband.Patient has two children.Patient is retired.Patient has a Master's degree.  ? Patient drinks two cups of coffee daily.  ? Patient is right-handed.  ? ?Social Determinants of Health  ? ?Financial Resource Strain: Not on file  ?Food Insecurity: Not on  file  ?Transportation Needs: Not on file  ?Physical Activity: Not on file  ?Stress: Not on file  ?Social Connections: Not on file  ?Intimate Partner Violence: Not on file  ?  ? ?Review of Systems: ?General: negative for chills, fever, night sweats or weight changes.  ?Cardiovascular: negative for chest pain, dyspnea on exertion, edema, orthopnea, palpitations, paroxysmal nocturnal dyspnea or shortness of breath ?Dermatological: negative for rash ?Respiratory: negative for cough or wheezing ?Urologic: negative for hematuria ?Abdominal: negative for nausea, vomiting, diarrhea, bright red blood per rectum, melena, or hematemesis ?Neurologic: negative for visual changes, syncope, or dizziness ?All other systems reviewed and are otherwise negative except as noted above. ? ? ? ?Blood pressure 126/60, pulse 68, height 5' 5.5" (1.664 m), weight 136 lb (61.7 kg), SpO2 98 %.  ?General appearance: alert and no distress ?Neck: no adenopathy, no carotid bruit, no JVD, supple, symmetrical, trachea midline, and thyroid not enlarged, symmetric, no tenderness/mass/nodules ?Lungs: clear to auscultation bilaterally ?Heart: regular rate and rhythm, S1, S2 normal, no murmur, click, rub or gallop ?Extremities: extremities normal, atraumatic, no cyanosis or edema ?Pulses: 2+ and symmetric ?Skin: Skin color, texture, turgor normal. No rashes or lesions ?Neurologic: Grossly normal ? ?EKG not performed today ? ?ASSESSMENT AND PLAN:  ? ?Hyperlipidemia ?History of mild hyperlipidemia on low-dose Crestor with lipid profile performed 03/05/2021 revealing total cholesterol of 167, LDL of 86 and HDL of 70. ? ?PVC's (premature ventricular contractions) ?History of frequent PVCs on event monitoring.  I did refer her to Dr. Lalla Brothers who began her empirically on low-dose amiodarone which she did not tolerate.  He then placed her on a beta-blocker which markedly improved the frequency of her PVCs by follow-up event monitoring although she complains of  fatigue and not sleeping well on the medication.  I am going to cut her metoprolol in half to 12.5 mg a day. ? ? ? ? ?[name] MD FACP,FACC,FAHA, FSCAI ?08/04/2021 ?9:43 AM ?

## 2021-08-04 NOTE — Assessment & Plan Note (Signed)
History of mild hyperlipidemia on low-dose Crestor with lipid profile performed 03/05/2021 revealing total cholesterol of 167, LDL of 86 and HDL of 70. ?

## 2021-08-07 DIAGNOSIS — M1380 Other specified arthritis, unspecified site: Secondary | ICD-10-CM | POA: Diagnosis not present

## 2021-08-07 DIAGNOSIS — Z79899 Other long term (current) drug therapy: Secondary | ICD-10-CM | POA: Diagnosis not present

## 2021-08-12 DIAGNOSIS — M545 Low back pain, unspecified: Secondary | ICD-10-CM | POA: Diagnosis not present

## 2021-08-12 DIAGNOSIS — Z79899 Other long term (current) drug therapy: Secondary | ICD-10-CM | POA: Diagnosis not present

## 2021-08-12 DIAGNOSIS — M159 Polyosteoarthritis, unspecified: Secondary | ICD-10-CM | POA: Diagnosis not present

## 2021-09-02 DIAGNOSIS — H04123 Dry eye syndrome of bilateral lacrimal glands: Secondary | ICD-10-CM | POA: Diagnosis not present

## 2021-09-02 DIAGNOSIS — Z79899 Other long term (current) drug therapy: Secondary | ICD-10-CM | POA: Diagnosis not present

## 2021-09-02 DIAGNOSIS — M456 Ankylosing spondylitis lumbar region: Secondary | ICD-10-CM | POA: Diagnosis not present

## 2021-11-10 DIAGNOSIS — L400 Psoriasis vulgaris: Secondary | ICD-10-CM | POA: Diagnosis not present

## 2021-11-10 DIAGNOSIS — L720 Epidermal cyst: Secondary | ICD-10-CM | POA: Diagnosis not present

## 2021-11-10 DIAGNOSIS — L821 Other seborrheic keratosis: Secondary | ICD-10-CM | POA: Diagnosis not present

## 2021-11-10 DIAGNOSIS — L814 Other melanin hyperpigmentation: Secondary | ICD-10-CM | POA: Diagnosis not present

## 2021-11-10 DIAGNOSIS — L918 Other hypertrophic disorders of the skin: Secondary | ICD-10-CM | POA: Diagnosis not present

## 2021-11-10 DIAGNOSIS — L820 Inflamed seborrheic keratosis: Secondary | ICD-10-CM | POA: Diagnosis not present

## 2021-11-16 IMAGING — CT CT CARDIAC CORONARY ARTERY CALCIUM SCORE
3 series · 14 of 20 positions shown, 16 images · non-contrast
Comparison: None.
COMPARISON: None.

Addendum:
EXAM:
OVER-READ INTERPRETATION  CT CHEST

The following report is an over-read performed by radiologist Dr.
Domoh Kosa [REDACTED] on 02/11/2021. This
over-read does not include interpretation of cardiac or coronary
anatomy or pathology. The coronary calcium score/coronary CTA
interpretation by the cardiologist is attached.
CLINICAL DATA: Risk stratification
Coronary Calcium Score
TECHNIQUE: The patient was scanned on a Siemens Somatom 64 slice scanner. Axial
non-contrast 3 mm slices were carried out through the heart. The
data set was analyzed on a dedicated work station and scored using
the Agatson method.

[Series 2: cascseq 2.0 sa36 70% (id) · axial · 0.39mm/px · z∈[-203,-113]mm · 4 of 76 slices shown]
[im 16/76  vessel]
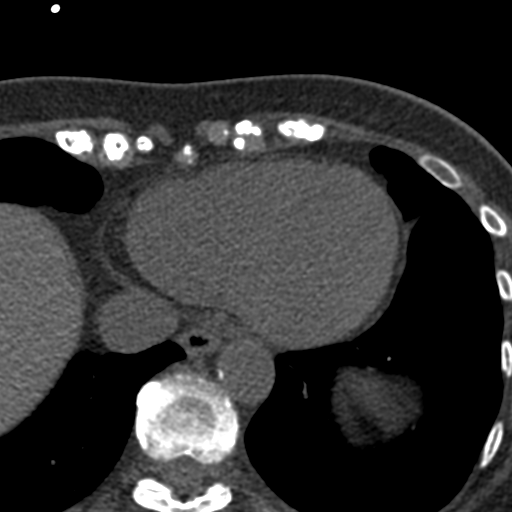
[im 31/76  vessel]
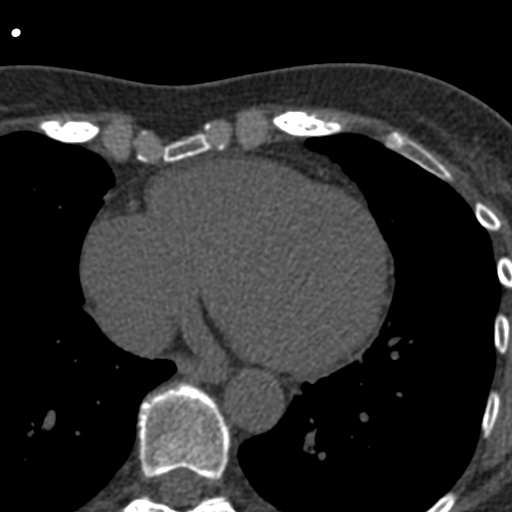
[im 46/76  vessel]
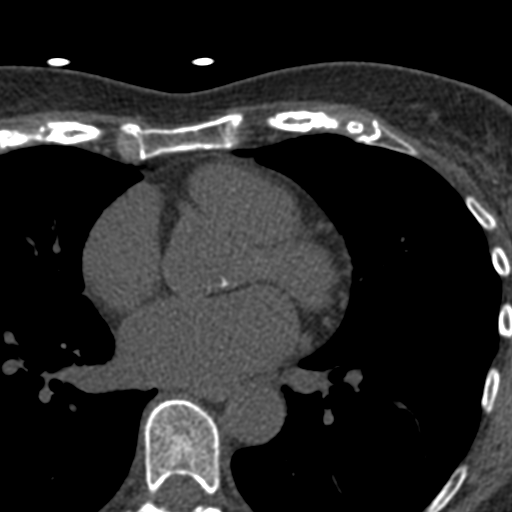
[im 61/76  vessel]
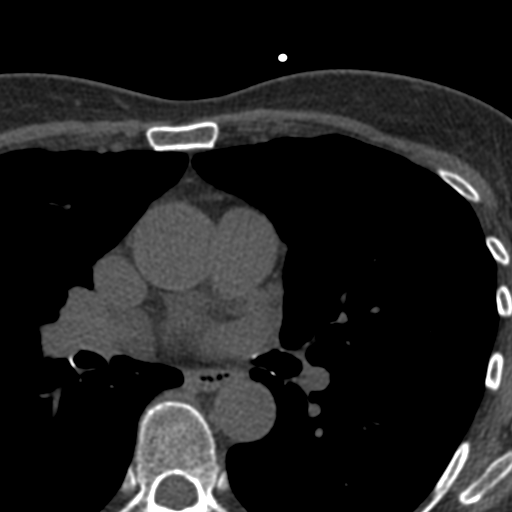

[Series 3: cascseq 2.0 bf37 st · axial · 0.61mm/px · z∈[-209,-109]mm · 5 of 76 slices shown, 7 images]
[im 13/76  vessel]
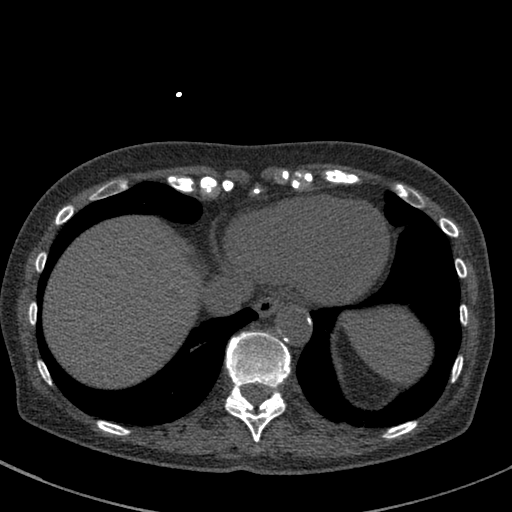
[im 13/76  lung]
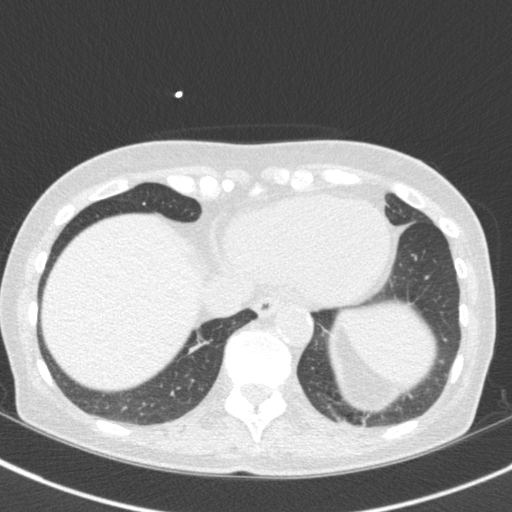
[im 26/76  vessel]
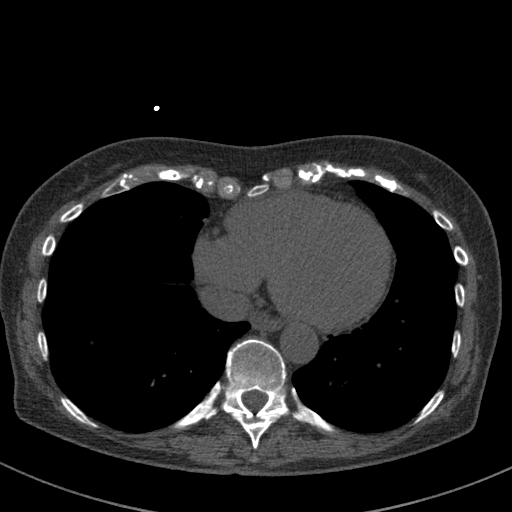
[im 38/76  vessel]
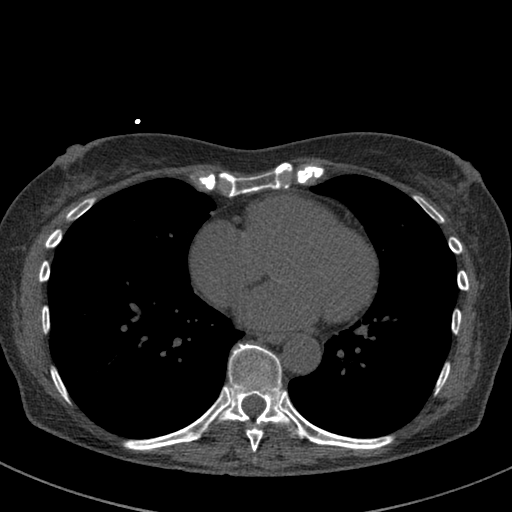
[im 51/76  vessel]
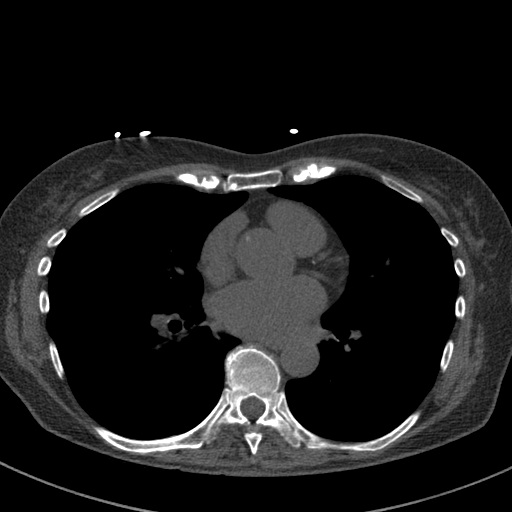
[im 63/76  vessel]
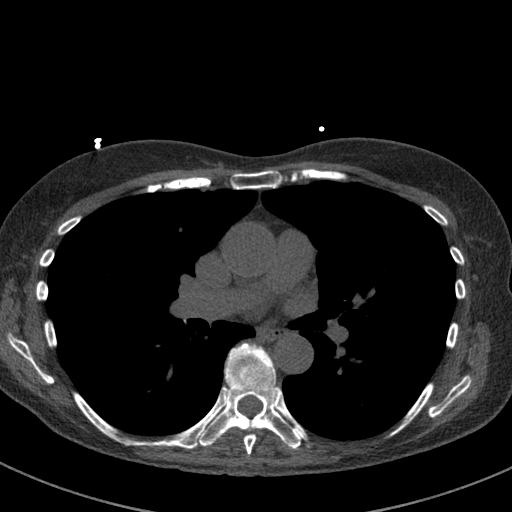
[im 63/76  lung]
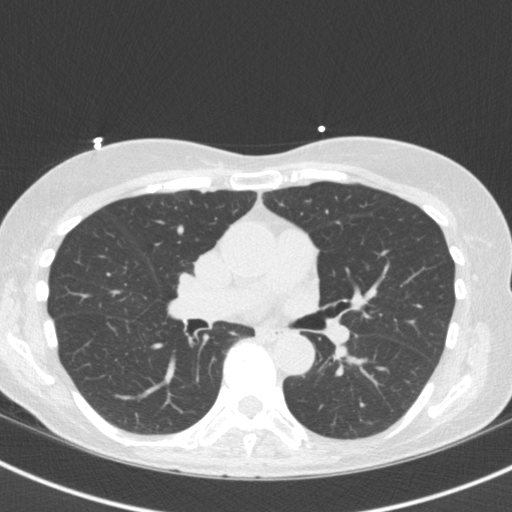

[Series 4: cascseq 2.0 br59 lung · axial · 0.61mm/px · z∈[-209,-109]mm · 5 of 76 slices shown]
[im 13/76  lung]
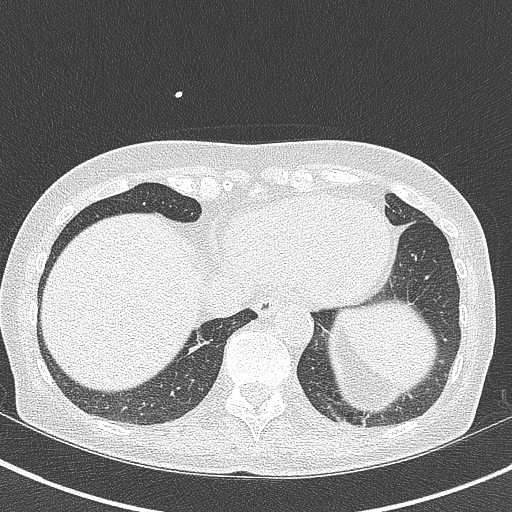
[im 26/76  lung]
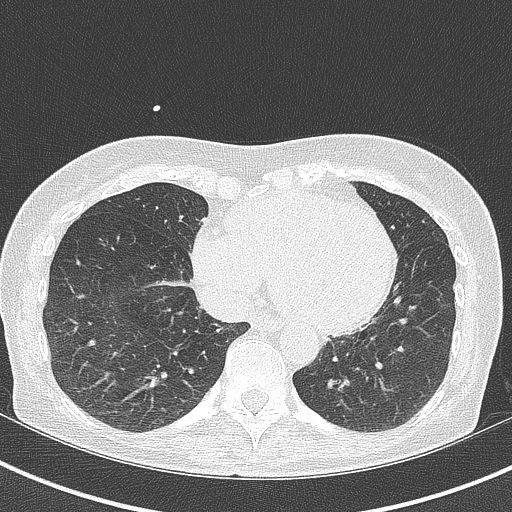
[im 38/76  lung]
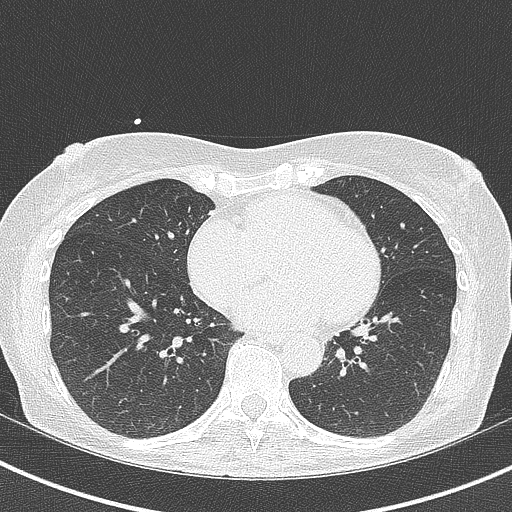
[im 51/76  lung]
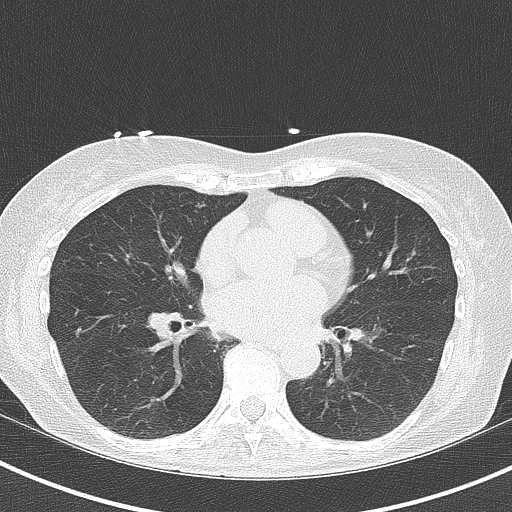
[im 63/76  lung]
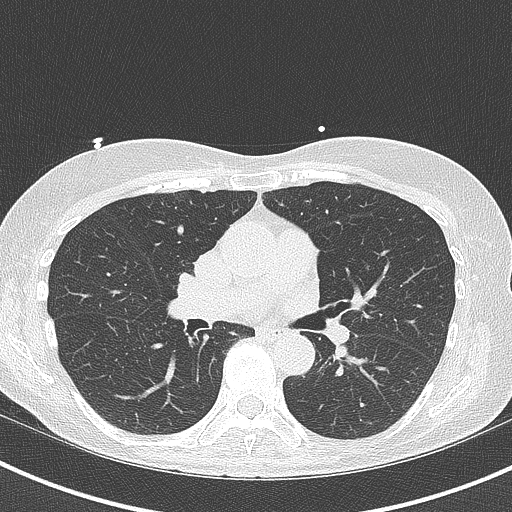

[14 of 20 positions shown; findings below may reference images not displayed]

FINDINGS: Atherosclerotic calcifications in the thoracic aorta. Within the
visualized portions of the thorax there are no suspicious appearing
pulmonary nodules or masses, there is no acute consolidative
airspace disease, no pleural effusions, no pneumothorax and no
lymphadenopathy. Visualized portions of the upper abdomen are
unremarkable. There are no aggressive appearing lytic or blastic
lesions noted in the visualized portions of the skeleton.
IMPRESSION: 1.  Aortic Atherosclerosis (WLJV0-29R.R).
FINDINGS: Non-cardiac: See separate report from [REDACTED].

Ascending aorta: Normal diameter 3.3 cm

Pericardium: Normal

Coronary arteries: Mild calcium noted in proximal LAD
IMPRESSION: Coronary calcium score of 13.9. This was 27 th percentile for age
and sex matched control.

Charmaine Monzon

*** End of Addendum ***
EXAM:
OVER-READ INTERPRETATION  CT CHEST

The following report is an over-read performed by radiologist Dr.
Domoh Kosa [REDACTED] on 02/11/2021. This
over-read does not include interpretation of cardiac or coronary
anatomy or pathology. The coronary calcium score/coronary CTA
interpretation by the cardiologist is attached.
FINDINGS: Atherosclerotic calcifications in the thoracic aorta. Within the
visualized portions of the thorax there are no suspicious appearing
pulmonary nodules or masses, there is no acute consolidative
airspace disease, no pleural effusions, no pneumothorax and no
lymphadenopathy. Visualized portions of the upper abdomen are
unremarkable. There are no aggressive appearing lytic or blastic
lesions noted in the visualized portions of the skeleton.
IMPRESSION: 1.  Aortic Atherosclerosis (WLJV0-29R.R).

## 2021-12-04 ENCOUNTER — Ambulatory Visit: Payer: Medicare PPO | Admitting: Cardiovascular Disease

## 2021-12-04 ENCOUNTER — Encounter: Payer: Self-pay | Admitting: Cardiovascular Disease

## 2021-12-04 VITALS — BP 100/60 | HR 66 | Ht 65.0 in | Wt 135.0 lb

## 2021-12-04 DIAGNOSIS — I493 Ventricular premature depolarization: Secondary | ICD-10-CM | POA: Diagnosis not present

## 2021-12-04 NOTE — Patient Instructions (Signed)

## 2021-12-04 NOTE — Progress Notes (Signed)
Ms. [name] returns today for follow-up.  We reduced her Toprol-XL from 25 to 12.5 mg a day.  She is less fatigued.  Her PVCs are better as well.  Her lipid profile performed 03/05/2021 revealed total cholesterol 167, LDL of 86 and HDL of 70.  Her coronary calcium score is low at 14.  She did have a normal 2D echo.  I will see her back in 1 year for follow-up.  [name], M.D., FACP, [facility], [facility] [address] [place], [place]  [address]  [phone] 12/04/2021 10:40 AM

## 2021-12-09 DIAGNOSIS — Z1272 Encounter for screening for malignant neoplasm of vagina: Secondary | ICD-10-CM | POA: Diagnosis not present

## 2021-12-09 DIAGNOSIS — Z124 Encounter for screening for malignant neoplasm of cervix: Secondary | ICD-10-CM | POA: Diagnosis not present

## 2021-12-09 DIAGNOSIS — N951 Menopausal and female climacteric states: Secondary | ICD-10-CM | POA: Diagnosis not present

## 2021-12-09 DIAGNOSIS — Z6822 Body mass index (BMI) 22.0-22.9, adult: Secondary | ICD-10-CM | POA: Diagnosis not present

## 2021-12-09 DIAGNOSIS — R6882 Decreased libido: Secondary | ICD-10-CM | POA: Diagnosis not present

## 2022-01-11 DIAGNOSIS — H43813 Vitreous degeneration, bilateral: Secondary | ICD-10-CM | POA: Diagnosis not present

## 2022-01-11 DIAGNOSIS — M1380 Other specified arthritis, unspecified site: Secondary | ICD-10-CM | POA: Diagnosis not present

## 2022-01-11 DIAGNOSIS — Z79899 Other long term (current) drug therapy: Secondary | ICD-10-CM | POA: Diagnosis not present

## 2022-01-11 DIAGNOSIS — H11153 Pinguecula, bilateral: Secondary | ICD-10-CM | POA: Diagnosis not present

## 2022-01-11 DIAGNOSIS — H35372 Puckering of macula, left eye: Secondary | ICD-10-CM | POA: Diagnosis not present

## 2022-01-26 ENCOUNTER — Ambulatory Visit: Payer: Medicare PPO | Admitting: Physician Assistant

## 2022-01-26 ENCOUNTER — Encounter: Payer: Self-pay | Admitting: Physician Assistant

## 2022-01-26 VITALS — BP 122/62 | HR 80 | Ht 65.0 in | Wt 135.0 lb

## 2022-01-26 DIAGNOSIS — Z800 Family history of malignant neoplasm of digestive organs: Secondary | ICD-10-CM | POA: Diagnosis not present

## 2022-01-26 DIAGNOSIS — R195 Other fecal abnormalities: Secondary | ICD-10-CM | POA: Diagnosis not present

## 2022-01-26 NOTE — Patient Instructions (Addendum)
If you are age 79 or older, your body mass index should be between 23-30. Your Body mass index is 22.47 kg/m. If this is out of the aforementioned range listed, please consider follow up with your Primary Care Provider. ________________________________________________________  The [place] [facility] providers would like to encourage you to use [facility] to communicate with providers for non-urgent requests or questions.  Due to long hold times on the telephone, sending your provider a message by [facility] may be a faster and more efficient way to get a response.  Please allow 48 business hours for a response.  Please remember that this is for non-urgent requests.  _______________________________________________________  We have placed an order for you to have a Virtual Colonoscopy. You will be contacted by [facility]. [phone] or [phone]) You will need to go by once they have contacted you to pick up contrast and receive instructions on how to prep.  Follow up pending.  Thank you for entrusting me with your care and choosing [facility].  Amy Esterwood, PA-C

## 2022-01-26 NOTE — Progress Notes (Signed)
Subjective:    Patient ID: [name], female    DOB: 11/03/1940, 81 y.o.   MRN: [id number]  HPI  [name] is a pleasant 81 year old female, established with Dr. Christella Hartigan.  She has family history of colon cancer in her mother, diagnosed in her early 36s.  Patient was last seen in the office in 2019.  At that time she was advised to have follow-up colonoscopy at a 10-year interval in 2023. Last colonoscopy was done in November 2013 per Dr. Christella Hartigan with finding of a very tortuous colon resulting in significant looping and an incomplete exam.  She subsequently had CT colonography which showed bilateral renal stones, and a low-attenuation area in the hepatic dome, no colonic abnormalities noted.  Subsequent MRI of the liver showed the area in the hepatic dome to be a benign hemangioma. Patient comes in today wanting to move forward with repeat colonoscopy.  She says that she has noted some changes with her bowel habits occurring over the past 3 to 4 months.  She is not having any constipation or diarrhea issues has not noticed any melena or hematochezia.  She says she had been taking magnesium oxide long-term for other purposes but would normally have 2-3 bowel movements per day.  A few months ago she was noticing some burning with bowel movements and stopped taking the magnesium.  The burning stopped but since then she has noticed somewhat narrower stools.  She says she used to have a couple of days with regular bowel movements and then would have a day with a larger evacuation which seem to clean her bowel out.  She stopped having these but is still having fairly regular bowel movements though noticing the stool just a bit smaller or narrower.  She has intermittently had some heaviness sensation at the rectum but this has been a long-term issue.  She has been eating prunes or dates every so many days now in order to produce a larger evacuation and finds that to work well.  Patient has history of  psoriatic arthritis for which she is on Azulfidine, has history of ruptured appendix at age 89, is status post cholecystectomy, history migraine headaches hypothyroidism, and mild cognitive impairment Review of Systems. Pertinent positive and negative review of systems were noted in the above HPI section.  All other review of systems was otherwise negative.   Outpatient Encounter Medications as of 01/26/2022  Medication Sig   butalbital-aspirin-caffeine (FIORINAL) 50-325-40 MG capsule Take 1 capsule by mouth every 4 (four) hours as needed for headache.   estradiol (CLIMARA - DOSED IN MG/24 HR) 0.1 mg/24hr patch Climara 0.1 mg/24 hr transdermal patch   fluocinonide (LIDEX) 0.05 % external solution Apply 1 application topically 2 (two) times daily as needed (for psorasis.).    gabapentin (NEURONTIN) 100 MG capsule Take 3 capsules (300 mg total) by mouth at bedtime.   levothyroxine (SYNTHROID) 50 MCG tablet Take 1 tablet (50 mcg total) by mouth every morning.   metoprolol succinate (TOPROL XL) 25 MG 24 hr tablet Take 0.5 tablets (12.5 mg total) by mouth daily.   progesterone (PROMETRIUM) 100 MG capsule Take 100 mg by mouth at bedtime.   rosuvastatin (CRESTOR) 5 MG tablet Take 1 tablet (5 mg total) by mouth daily.   sulfaSALAzine (AZULFIDINE) 500 MG tablet Take 1,000-1,500 mg by mouth 2 (two) times daily. 1000 mg in the am and 1500 mg at night   TESTOSTERONE PROPIONATE IJ Place 1 application onto the skin 3 (three) times  a week.   VITAMIN D PO Take 1,000 mg by mouth daily.   Magnesium Oxide 500 MG TABS Take 500 mg by mouth at bedtime. (Patient not taking: Reported on 01/26/2022)   No facility-administered encounter medications on file as of 01/26/2022.   Allergies  Allergen Reactions   Imitrex [Sumatriptan] Nausea And Vomiting and Hypertension   Nsaids Other (See Comments)    Gerd as result taking.   Ciprofloxacin Hcl    Keflex [Cephalexin] Nausea And Vomiting   Patient Active Problem List    Diagnosis Date Noted   PVC's (premature ventricular contractions) 08/04/2021   Hypothyroidism 03/05/2021   Migraine 03/05/2021   Hyperlipidemia 10/16/2020   History of COVID-19 10/14/2020   Femoral nerve lesion, right 02/16/2018   OA (osteoarthritis) of hip 07/06/2017   Cervical disc disorder with radiculopathy of cervical region 03/16/2016   Contracture of joint of right shoulder region 03/16/2016   DDD (degenerative disc disease), cervical 12/01/2015   MCI (mild cognitive impairment) 12/01/2015   Psoriatic arthritis (HCC) 11/08/2013   Adrenal gland dysfunction (HCC)    Social History   Socioeconomic History   Marital status: Widowed    Spouse name: [name]   Number of children: 2   Years of education: Masters   Highest education level: Not on file  Occupational History   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   Patient drinks two cups of coffee daily.   Patient is right-handed.   Social Determinants of Health   Financial Resource Strain: Not on file  Food Insecurity: Not on file  Transportation Needs: Not on file  Physical Activity: Not on file  Stress: Not on file  Social Connections: Not on file  Intimate Partner Violence: Not on file    Ms. Kidney's family history includes Anxiety disorder in her mother; Breast cancer in her mother; Colon cancer in her mother; Depression in her mother; Diabetes in her paternal grandmother; Esophageal cancer in her father; Heart attack in her paternal  grandfather; Hypertension in her father; Liver cancer in her maternal grandmother; Stomach cancer in her father.      Objective:    Vitals:   01/26/22 1101  BP: 122/62  Pulse: 80    Physical Exam Well-developed well-nourished  elderly WF in no acute distress.  Height, [id number],829  BMI 22.4  HEENT; nontraumatic normocephalic, EOMI, PE R LA, sclera anicteric. Oropharynx;not examined today  Neck; supple, no JVD Cardiovascular; regular rate and rhythm with S1-S2, no murmur rub or gallop Pulmonary; Clear bilaterally Abdomen; soft, nontender, nondistended, no palpable mass or hepatosplenomegaly, bowel sounds are active, midline incisional scar Rectal;not done today  Skin; benign exam, no jaundice rash or appreciable lesions Extremities; no clubbing cyanosis or edema skin warm and dry Neuro/Psych; alert and oriented x4, grossly nonfocal mood and affect appropriate        Assessment & Plan:   #28 81 year old white female with family history of colon cancer in patient's  mother diagnosed in her early 51s, who is due for routine colon screening  She has been noticing some mild changes in her bowels over the past 3 to 4 months with somewhat narrower stools and change in bowel pattern but she also had stopped taking magnesium she had been on for quite a while.  Last colonoscopy 2013 was incomplete due to a very tortuous colon resulting in significant looping and an incomplete exam.  Subsequent CT colonography showed an unremarkable colon  #2 history of hepatic hemangioma #3.  Status postcholecystectomy #4.  Status post ruptured appendix 833 #5.  History of psoriatic arthritis #6.  Mild cognitive impairment #7.  History of PVCs #8 status post hysterectomy and rectocele repair  Plan; Due to incomplete colonoscopy 2013/tortuous colon will plan to proceed with CT colonography.  This was discussed with the patient in detail and she is in agreement with this plan.  Should she have any  abnormality noted on CT colonography, colonoscopy can be attempted with pediatric scope or ultraslim scope. Further recommendations pending results of above.  (Dr. Barron Alvine supervising, if procedure indicated will schedule with Dr. Barron Alvine and Dr. Larae Grooms absence)     Jaydence Arnesen Oswald Hillock PA-C 01/26/2022   Cc: Myrlene Broker, *

## 2022-01-28 DIAGNOSIS — H029 Unspecified disorder of eyelid: Secondary | ICD-10-CM | POA: Diagnosis not present

## 2022-01-28 DIAGNOSIS — H02402 Unspecified ptosis of left eyelid: Secondary | ICD-10-CM | POA: Diagnosis not present

## 2022-01-28 DIAGNOSIS — H0288B Meibomian gland dysfunction left eye, upper and lower eyelids: Secondary | ICD-10-CM | POA: Diagnosis not present

## 2022-01-28 DIAGNOSIS — H0288A Meibomian gland dysfunction right eye, upper and lower eyelids: Secondary | ICD-10-CM | POA: Diagnosis not present

## 2022-01-29 NOTE — Progress Notes (Signed)
Agree with the assessment and plan as outlined by Amy Esterwood, PA-C.  Modean Mccullum, DO, FACG  

## 2022-02-19 DIAGNOSIS — Z79890 Hormone replacement therapy (postmenopausal): Secondary | ICD-10-CM | POA: Diagnosis not present

## 2022-02-19 DIAGNOSIS — E039 Hypothyroidism, unspecified: Secondary | ICD-10-CM | POA: Diagnosis not present

## 2022-02-19 DIAGNOSIS — G4700 Insomnia, unspecified: Secondary | ICD-10-CM | POA: Diagnosis not present

## 2022-02-19 DIAGNOSIS — H029 Unspecified disorder of eyelid: Secondary | ICD-10-CM | POA: Diagnosis not present

## 2022-02-19 DIAGNOSIS — L821 Other seborrheic keratosis: Secondary | ICD-10-CM | POA: Diagnosis not present

## 2022-02-19 DIAGNOSIS — H02001 Unspecified entropion of right upper eyelid: Secondary | ICD-10-CM | POA: Diagnosis not present

## 2022-02-19 DIAGNOSIS — G629 Polyneuropathy, unspecified: Secondary | ICD-10-CM | POA: Diagnosis not present

## 2022-03-03 NOTE — Progress Notes (Signed)
Subjective:   [name] is a 81 y.o. female who presents for an Initial Medicare Annual Wellness Visit. I connected with  [name] on 03/04/22 by a audio enabled telemedicine application and verified that I am speaking with the correct person using two identifiers.  Patient Location: Home  Provider Location: Home Office  I discussed the limitations of evaluation and management by telemedicine. The patient expressed understanding and agreed to proceed.  Review of Systems    Deferred to PCP Cardiac Risk Factors include: advanced age (>30men, >22 women);dyslipidemia;hypertension     Objective:    Today's Vitals   03/04/22 1321  PainSc: 1    There is no height or weight on file to calculate BMI.     03/04/2022    1:33 PM 04/18/2021    2:40 PM 10/07/2020   10:09 AM 09/22/2017    4:19 PM 09/22/2017    2:13 PM 07/06/2017   11:20 AM 07/06/2017    7:11 AM  Advanced Directives  Does Patient Have a Medical Advance Directive? Yes No Yes Yes No;Yes No Yes  Type of Materials engineer of [place];Living will Healthcare Power of [place] of [place]  Does patient want to make changes to medical advance directive? No - Patient declined  No - Patient declined No - Patient declined     Copy of [place] in Chart? No - copy requested   Yes  No - copy requested No - copy requested  Would patient like information on creating a medical advance directive?      No - Patient declined     Current Medications (verified) Outpatient Encounter Medications as of 03/04/2022  Medication Sig   butalbital-aspirin-caffeine (FIORINAL) 50-325-40 MG capsule Take 1 capsule by mouth every 4 (four) hours as needed for headache.   estradiol (CLIMARA - DOSED IN MG/24 HR) 0.1 mg/24hr patch Climara 0.1 mg/24 hr transdermal patch   fluocinonide (LIDEX) 0.05 %  external solution Apply 1 application topically 2 (two) times daily as needed (for psorasis.).    gabapentin (NEURONTIN) 100 MG capsule Take 3 capsules (300 mg total) by mouth at bedtime.   hydroxychloroquine (PLAQUENIL) 200 MG tablet Take 200 mg by mouth daily.   levothyroxine (SYNTHROID) 50 MCG tablet Take 1 tablet (50 mcg total) by mouth every morning.   metoprolol succinate (TOPROL XL) 25 MG 24 hr tablet Take 0.5 tablets (12.5 mg total) by mouth daily.   progesterone (PROMETRIUM) 100 MG capsule Take 100 mg by mouth at bedtime.   rosuvastatin (CRESTOR) 5 MG tablet Take 1 tablet (5 mg total) by mouth daily.   sulfaSALAzine (AZULFIDINE) 500 MG tablet Take 1,000-1,500 mg by mouth 2 (two) times daily. 1000 mg in the am and 1500 mg at night   TESTOSTERONE PROPIONATE IJ Place 1 application  onto the skin 3 (three) times a week.   VITAMIN D PO Take 1,000 mg by mouth daily.   Magnesium Oxide 500 MG TABS Take 500 mg by mouth at bedtime. (Patient not taking: Reported on 01/26/2022)   No facility-administered encounter medications on file as of 03/04/2022.    Allergies (verified) Imitrex [sumatriptan], Nsaids, Ciprofloxacin hcl, and Keflex [cephalexin]   History: 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[facility])     dr Helane Rima, [facility] B now resolved   Anemia    iron   Arthritis  psoriatic  in hips oa in hands   Blood transfusion without reported diagnosis 1978   Cataract    chronic headaches    histamine migraines, rarely occur   Chronic kidney disease    hx bladder infections yrs ago   Colon polyp yrs ago   Dysrhythmia 1996 to 1999   wore holter monitor result of accidental fall, occasional irregular pulse   Family history of adverse reaction to anesthesia    father had aspiration after anesthesia one time   Gastric polyps    GERD (gastroesophageal reflux disease)    History of migraine headaches    Hyperlipidemia    Hypothyroidism    Neuromuscular disease  (HCC)    fibromyalgia   Pneumonia 2005aspiration pneumonia   stopped when got pneumonia shot, also 2015 regular pneumonia   PONV (postoperative nausea and vomiting)    patient wishes to have no anesthetics that could affect memory due to past memory issues, no cause found   Psoriatic arthritis ([place])    Past Surgical History:  Procedure Laterality Date   ABDOMINAL HYSTERECTOMY     left ovary left and tube left   APPENDECTOMY     with hysterectomy   CARDIAC CATHETERIZATION     CATARACT EXTRACTION, BILATERAL     CHOLECYSTECTOMY     COLONOSCOPY     FINGER SURGERY     middle finger right hand   INCONTINENCE SURGERY     bladder sling   RECTOCELE REPAIR     RHINOPLASTY     SHOULDER ARTHROSCOPY WITH ROTATOR CUFF REPAIR AND SUBACROMIAL DECOMPRESSION Right 06/17/2015   Procedure: SHOULDER DIAGNOSTIC OPERATIVE ARTHROSCOPY WITH MINI-OPEN ROTATOR CUFF REPAIR AND SUBACROMIAL DECOMPRESSION;  Surgeon: Meredith Pel, MD;  Location: [facility];  Service: Orthopedics;  Laterality: Right;   TONSILLECTOMY  age 72   adenoids also   TOTAL HIP ARTHROPLASTY Right 07/06/2017   Procedure: RIGHT TOTAL HIP ARTHROPLASTY ANTERIOR APPROACH;  Surgeon: Gaynelle Arabian, MD;  Location: [facility];  Service: Orthopedics;  Laterality: Right;   Family History  Problem Relation Age of Onset   Esophageal cancer Father    Stomach cancer Father    Hypertension Father    Colon cancer Mother    Breast cancer Mother    Depression Mother    Anxiety disorder Mother    Heart attack Paternal Grandfather    Diabetes Paternal Grandmother    Liver cancer Maternal Grandmother    Rectal cancer Neg Hx    Social History   Socioeconomic History   Marital status: Widowed    Spouse name: Truman Hayward   Number of children: 2   Years of education: Masters   Highest education level: Not on file  Occupational History   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   Occupation: retired  Tobacco Use   Smoking status:  Never   Smokeless tobacco: Never  Scientific laboratory technician Use: Never used  Substance and Sexual Activity   Alcohol use: Yes    Comment: Rayburn Mundis 1-3 glasses a week   Drug use: No   Sexual activity: Yes  Other Topics Concern   Not on file  Social History Narrative   Patient is married Truman Hayward) and lives at home with her husband.Patient has two children.Patient is retired.Patient has a Master's degree.   Patient drinks two cups of coffee daily.   Patient is right-handed.   Social Determinants of Health   Financial Resource Strain: Low Risk  (03/04/2022)   Overall  Financial Resource Strain (CARDIA)    Difficulty of Paying Living Expenses: Not hard at all  Food Insecurity: No Food Insecurity (03/04/2022)   Hunger Vital Sign    Worried About Running Out of Food in the Last Year: Never true    Ran Out of Food in the Last Year: Never true  Transportation Needs: No Transportation Needs (03/04/2022)   PRAPARE - Hydrologist (Medical): No    Lack of Transportation (Non-Medical): No  Physical Activity: Sufficiently Active (03/04/2022)   Exercise Vital Sign    Days of Exercise per Week: 5 days    Minutes of Exercise per Session: 40 min  Stress: No Stress Concern Present (03/04/2022)   [place]    Feeling of Stress : Not at all  Social Connections: Moderately Integrated (03/04/2022)   Social Connection and Isolation Panel [NHANES]    Frequency of Communication with Friends and Family: More than three times a week    Frequency of Social Gatherings with Friends and Family: More than three times a week    Attends Religious Services: Never    Marine scientist or Organizations: Yes    Attends Music therapist: More than 4 times per year    Marital Status: Married    Tobacco Counseling Counseling given: Not Answered   Clinical Intake:  Pre-visit preparation completed: Yes  Pain :  0-10 Pain Score: 1  Pain Location: Generalized     Nutritional Status: BMI of 19-24  Normal Nutritional Risks: None Diabetes: No  How often do you need to have someone help you when you read instructions, pamphlets, or other written materials from your doctor or pharmacy?: 1 - Never What is the last grade level you completed in school?: Masters degree  Diabetic?No  Interpreter Needed?: No  Information entered by :: Cheryl Loron RN   Activities of Daily Living    03/04/2022    1:35 PM 03/05/2021   10:41 AM  In your present state of health, do you have any difficulty performing the following activities:  Hearing? 0 0  Vision? 0 0  Difficulty concentrating or making decisions? 0 0  Walking or climbing stairs? 0 0  Dressing or bathing? 0 0  Doing errands, shopping? 0 0  Preparing Food and eating ? N   Using the Toilet? N   In the past six months, have you accidently leaked urine? N   Do you have problems with loss of bowel control? N   Managing your Medications? N   Managing your Finances? N   Housekeeping or managing your Housekeeping? N     Patient Care Team: Hoyt Koch, MD as PCP - General (Internal Medicine) Vickie Epley, MD as PCP - Electrophysiology (Cardiology) Carol Ada, MD (Family Medicine)  Indicate any recent Medical Services you may have received from other than [facility] providers in the past year (date may be approximate).     Assessment:   This is a routine wellness examination for [name].  Hearing/Vision screen No results found.  Dietary issues and exercise activities discussed: Current Exercise Habits: Home exercise routine, Type of exercise: calisthenics;walking, Time (Minutes): 45, Frequency (Times/Week): 5, Weekly Exercise (Minutes/Week): 225, Intensity: Mild, Exercise limited by: Other - see comments   Goals Addressed             This Visit's Progress    Patient Stated       Maintain my  current health status.       Depression Screen    03/04/2022    1:30 PM 03/05/2021   10:42 AM 03/16/2016   10:11 AM  PHQ 2/9 Scores  PHQ - 2 Score 1 0 4  PHQ- 9 Score   7    Fall Risk    03/04/2022    1:35 PM 04/10/2020    2:32 PM 02/16/2018   11:10 AM 01/18/2017    2:16 PM 08/24/2016    1:22 PM  [place] in the past year? 0 0 Yes No Yes  Number falls in past yr: 0  1  1  Injury with Fall? 0  No  No  Risk for fall due to : History of fall(s)  History of fall(s)    Follow up Falls evaluation completed    Falls prevention discussed    FALL RISK PREVENTION PERTAINING TO THE HOME:  Any stairs in or around the home? Yes  If so, are there any without handrails? Yes  Home free of loose throw rugs in walkways, pet beds, electrical cords, etc? Yes  Adequate lighting in your home to reduce risk of falls? Yes   ASSISTIVE DEVICES UTILIZED TO PREVENT FALLS:  Life alert? Yes  Use of a cane, walker or w/c? No  Grab bars in the bathroom? No  Shower chair or bench in shower? No  Elevated toilet seat or a handicapped toilet? No   Cognitive Function:      04/10/2020    2:35 PM 02/19/2019    3:15 PM 02/16/2018   11:12 AM 02/15/2017   10:15 AM 07/07/2016    2:33 PM  Montreal Cognitive Assessment   Visuospatial/ Executive (0/5) 5 5 5 5 3   Naming (0/3) 3 3 3 3 3   Attention: Read list of digits (0/2) 2 2 2 2 2   Attention: Read list of letters (0/1) 1 1 1 1 1   Attention: Serial 7 subtraction starting at 100 (0/3) 3 3 3 3 3   Language: Repeat phrase (0/2) 2 2 2 2 2   Language : Fluency (0/1) 1 1 1 1 1   Abstraction (0/2) 2 2 2 2 2   Delayed Recall (0/5) 5 5 4 5 5   Orientation (0/6) 6 6 6 6 6   Total 30 30 29 30 28       03/04/2022    1:39 PM  6CIT Screen  What Year? 0 points  What month? 0 points  What time? 0 points  Count back from 20 0 points  Months in reverse 0 points  Repeat phrase 0 points  Total Score 0 points    Immunizations  There is no immunization history on file for this  patient.  TDAP status: Due, Education has been provided regarding the importance of this vaccine. Advised may receive this vaccine at local pharmacy or Health Dept. Aware to provide a copy of the vaccination record if obtained from local pharmacy or Health Dept. Verbalized acceptance and understanding.  Flu Vaccine status: Due, Education has been provided regarding the importance of this vaccine. Advised may receive this vaccine at local pharmacy or Health Dept. Aware to provide a copy of the vaccination record if obtained from local pharmacy or Health Dept. Verbalized acceptance and understanding.  Pneumococcal vaccine status: Due, Education has been provided regarding the importance of this vaccine. Advised may receive this vaccine at local pharmacy or Health Dept. Aware to provide a copy of the vaccination record if obtained from local pharmacy or  Health Dept. Verbalized acceptance and understanding.  Covid-19 vaccine status: Information provided on how to obtain vaccines.   Qualifies for Shingles Vaccine? Yes   Zostavax completed No   Shingrix Completed?: No.    Education has been provided regarding the importance of this vaccine. Patient has been advised to call insurance company to determine out of pocket expense if they have not yet received this vaccine. Advised may also receive vaccine at local pharmacy or Health Dept. Verbalized acceptance and understanding.  Screening Tests Health Maintenance  Topic Date Due   Hepatitis C Screening  Never done   TETANUS/TDAP  Never done   Zoster Vaccines- Shingrix (1 of 2) Never done   Pneumonia Vaccine 67+ Years old (1 - PCV) Never done   DEXA SCAN  Never done   COVID-19 Vaccine (6 - Mixed Product risk series) 02/18/2020   INFLUENZA VACCINE  08/22/2022 (Originally 12/22/2021)   MAMMOGRAM  05/01/2022   HPV VACCINES  Aged Out    Health Maintenance  Health Maintenance Due  Topic Date Due   Hepatitis C Screening  Never done   TETANUS/TDAP   Never done   Zoster Vaccines- Shingrix (1 of 2) Never done   Pneumonia Vaccine 16+ Years old (1 - PCV) Never done   DEXA SCAN  Never done   COVID-19 Vaccine (6 - Mixed Product risk series) 02/18/2020    Colorectal Screening: Virtual colonoscopy scheduled 03/15/22  Mammogram status: Completed 05/01/21. Repeat every year  Bone Density Test: Patient reports had it at [facility] 05/01/21; same day as mammogram  Lung Cancer Screening: (Low Dose CT Chest recommended if Age 74-80 years, 30 pack-year currently smoking OR have quit w/in 15years.) does not qualify.   Additional Screening:  Hepatitis C Screening: does qualify; Completed education provided  Vision Screening: Recommended annual ophthalmology exams for early detection of glaucoma and other disorders of the eye. Is the patient up to date with their annual eye exam?  Yes  Who is the provider or what is the name of the office in which the patient attends annual eye exams? Dr. Lucianne Lei  If pt is not established with a provider, would they like to be referred to a provider to establish care?  N/A .   Dental Screening: Recommended annual dental exams for proper oral hygiene  Community Resource Referral / Chronic Care Management: CRR required this visit?  No   CCM required this visit?  No      Plan:     I have personally reviewed and noted the following in the patient's chart:   Medical and social history Use of alcohol, tobacco or illicit drugs  Current medications and supplements including opioid prescriptions. Patient is not currently taking opioid prescriptions. Functional ability and status Nutritional status Physical activity Advanced directives List of other physicians Hospitalizations, surgeries, and ER visits in previous 12 months Vitals Screenings to include cognitive, depression, and falls Referrals and appointments  In addition, I have reviewed and discussed with patient certain preventive protocols, quality metrics,  and best practice recommendations. A written personalized care plan for preventive services as well as general preventive health recommendations were provided to patient.     Michiel Cowboy, RN   03/04/2022   Nurse Notes:  [name] , Thank you for taking time to come for your Medicare Wellness Visit. I appreciate your ongoing commitment to your health goals. Please review the following plan we discussed and let me know if I can assist you in the future.  These are the goals we discussed:  Goals      Patient Stated     Maintain my current health status.        This is a list of the screening recommended for you and due dates:  Health Maintenance  Topic Date Due   Hepatitis C Screening: USPSTF Recommendation to screen - Ages 87-79 yo.  Never done   Tetanus Vaccine  Never done   Zoster (Shingles) Vaccine (1 of 2) Never done   Pneumonia Vaccine (1 - PCV) Never done   DEXA scan (bone density measurement)  Never done   COVID-19 Vaccine (6 - Mixed Product risk series) 02/18/2020   Flu Shot  08/22/2022*   Mammogram  05/01/2022   HPV Vaccine  Aged Out  *Topic was postponed. The date shown is not the original due date.

## 2022-03-03 NOTE — Patient Instructions (Signed)

## 2022-03-04 ENCOUNTER — Ambulatory Visit (INDEPENDENT_AMBULATORY_CARE_PROVIDER_SITE_OTHER): Payer: Medicare PPO | Admitting: *Deleted

## 2022-03-04 DIAGNOSIS — Z0000 Encounter for general adult medical examination without abnormal findings: Secondary | ICD-10-CM | POA: Diagnosis not present

## 2022-03-10 ENCOUNTER — Encounter: Payer: Medicare PPO | Admitting: Internal Medicine

## 2022-03-15 ENCOUNTER — Ambulatory Visit
Admission: RE | Admit: 2022-03-15 | Discharge: 2022-03-15 | Disposition: A | Discharge status: Home / Self Care | Payer: Medicare PPO | Source: Ambulatory Visit | Attending: Physician Assistant | Admitting: Physician Assistant

## 2022-03-15 DIAGNOSIS — R195 Other fecal abnormalities: Secondary | ICD-10-CM

## 2022-03-15 DIAGNOSIS — Z800 Family history of malignant neoplasm of digestive organs: Secondary | ICD-10-CM

## 2022-03-16 DIAGNOSIS — M159 Polyosteoarthritis, unspecified: Secondary | ICD-10-CM | POA: Diagnosis not present

## 2022-03-16 DIAGNOSIS — Z79899 Other long term (current) drug therapy: Secondary | ICD-10-CM | POA: Diagnosis not present

## 2022-03-16 DIAGNOSIS — M1380 Other specified arthritis, unspecified site: Secondary | ICD-10-CM | POA: Diagnosis not present

## 2022-03-16 DIAGNOSIS — M545 Low back pain, unspecified: Secondary | ICD-10-CM | POA: Diagnosis not present

## 2022-03-18 ENCOUNTER — Encounter: Payer: Self-pay | Admitting: Internal Medicine

## 2022-03-18 ENCOUNTER — Ambulatory Visit (INDEPENDENT_AMBULATORY_CARE_PROVIDER_SITE_OTHER): Payer: Medicare PPO | Admitting: Internal Medicine

## 2022-03-18 VITALS — BP 118/58 | HR 56 | Temp 97.6°F | Ht 65.5 in | Wt 134.0 lb

## 2022-03-18 DIAGNOSIS — Z0000 Encounter for general adult medical examination without abnormal findings: Secondary | ICD-10-CM

## 2022-03-18 DIAGNOSIS — E039 Hypothyroidism, unspecified: Secondary | ICD-10-CM | POA: Diagnosis not present

## 2022-03-18 DIAGNOSIS — Z23 Encounter for immunization: Secondary | ICD-10-CM | POA: Diagnosis not present

## 2022-03-18 DIAGNOSIS — E782 Mixed hyperlipidemia: Secondary | ICD-10-CM

## 2022-03-18 LAB — LIPID PANEL
Cholesterol: 158 mg/dL (ref 0–200)
HDL: 63.6 mg/dL (ref >39.00)
LDL Cholesterol: 77 mg/dL (ref 0–99)
NonHDL: 94.38
Total CHOL/HDL Ratio: 2
Triglycerides: 87 mg/dL (ref 0.0–149.0)
VLDL: 17.4 mg/dL (ref 0.0–40.0)

## 2022-03-18 LAB — TSH: TSH: 3.37 u[IU]/mL (ref 0.35–5.50)

## 2022-03-18 NOTE — Patient Instructions (Signed)
You can get the covid-19 vaccine at the [facility].

## 2022-03-18 NOTE — Progress Notes (Signed)
   Subjective:   Patient ID: [name], female    DOB: [birth year], 81 y.o.   MRN: [id number]  HPI The patient is here for physical.  PMH, [facility], social history reviewed and updated  Review of Systems  Constitutional: Negative.   HENT: Negative.    Eyes: Negative.   Respiratory:  Negative for cough, chest tightness and shortness of breath.   Cardiovascular:  Negative for chest pain, palpitations and leg swelling.  Gastrointestinal:  Negative for abdominal distention, abdominal pain, constipation, diarrhea, nausea and vomiting.  Musculoskeletal: Negative.   Skin: Negative.   Neurological: Negative.   Psychiatric/Behavioral: Negative.      Objective:  Physical Exam Constitutional:      Appearance: She is well-developed.  HENT:     Head: Normocephalic and atraumatic.  Cardiovascular:     Rate and Rhythm: Normal rate and regular rhythm.  Pulmonary:     Effort: Pulmonary effort is normal. No respiratory distress.     Breath sounds: Normal breath sounds. No wheezing or rales.  Abdominal:     General: Bowel sounds are normal. There is no distension.     Palpations: Abdomen is soft.     Tenderness: There is no abdominal tenderness. There is no rebound.  Musculoskeletal:     Cervical back: Normal range of motion.  Skin:    General: Skin is warm and dry.  Neurological:     Mental Status: She is alert and oriented to person, place, and time.     Coordination: Coordination normal.     Vitals:   03/18/22 0957  BP: (!) 118/58  Pulse: (!) 56  Temp: 97.6 F (36.4 C)  SpO2: 99%  Weight: 134 lb (60.8 kg)  Height: 5' 5.5" (1.664 m)    Assessment & Plan:  Flu shot given at visit

## 2022-03-19 ENCOUNTER — Telehealth: Payer: Self-pay

## 2022-03-19 DIAGNOSIS — Z0000 Encounter for general adult medical examination without abnormal findings: Secondary | ICD-10-CM | POA: Insufficient documentation

## 2022-03-19 NOTE — Telephone Encounter (Signed)
Spoke with patient and went over lab results 

## 2022-03-19 NOTE — Assessment & Plan Note (Signed)
Checking TSH and adjust synthroid 50 mcg daily as needed. 

## 2022-03-19 NOTE — Assessment & Plan Note (Signed)
Checking lipid panel and adjust crestor 5 mg daily as needed. 

## 2022-03-19 NOTE — Assessment & Plan Note (Signed)
Flu shot given. covid-19 counseled. Pneumonia complete per patient we do not have records. Shingrix counseled. Tetanus unsure last. Colonoscopy aged out. Mammogram aged out, pap smear aged out and dexa complete. Counseled about sun safety and mole surveillance. Counseled about the dangers of distracted driving. Given 10 year screening recommendations.

## 2022-03-19 NOTE — Telephone Encounter (Signed)
-----   Message from Hoyt Koch, MD sent at 03/19/2022  8:12 AM EDT ----- Normal/stable labs. No changes needed.

## 2022-05-03 DIAGNOSIS — Z1231 Encounter for screening mammogram for malignant neoplasm of breast: Secondary | ICD-10-CM | POA: Diagnosis not present

## 2022-05-03 LAB — HM MAMMOGRAPHY

## 2022-05-11 ENCOUNTER — Encounter: Payer: Self-pay | Admitting: Internal Medicine

## 2022-05-11 ENCOUNTER — Ambulatory Visit: Payer: Medicare PPO | Admitting: Internal Medicine

## 2022-05-11 VITALS — BP 120/60 | HR 73 | Temp 97.6°F | Ht 65.5 in | Wt 134.0 lb

## 2022-05-11 DIAGNOSIS — R928 Other abnormal and inconclusive findings on diagnostic imaging of breast: Secondary | ICD-10-CM | POA: Diagnosis not present

## 2022-05-11 DIAGNOSIS — R208 Other disturbances of skin sensation: Secondary | ICD-10-CM | POA: Diagnosis not present

## 2022-05-11 LAB — POCT URINALYSIS DIPSTICK
Bilirubin, UA: NEGATIVE
Glucose, UA: NEGATIVE
Ketones, UA: NEGATIVE
Nitrite, UA: NEGATIVE
Protein, UA: NEGATIVE
Spec Grav, UA: >=1.03 — AB (ref 1.010–1.025)
Urobilinogen, UA: NEGATIVE E.U./dL — AB
pH, UA: 6 (ref 5.0–8.0)

## 2022-05-11 MED ORDER — NITROFURANTOIN MONOHYD MACRO 100 MG PO CAPS: 100.0000 mg | ORAL_CAPSULE | Freq: Two times a day (BID) | ORAL | 0 refills | Status: AC

## 2022-05-11 NOTE — Patient Instructions (Signed)
We have sent in macrobid to take 1 pill twice a day for 5 days.  The foot looks good no fracture.

## 2022-05-11 NOTE — Progress Notes (Signed)
   Subjective:   Patient ID: [name], female    DOB: 07/05/1940, 81 y.o.   MRN: [id number]  HPI The patient is a 81 YO female coming in for possible UTI  Review of Systems  Constitutional: Negative.   Respiratory: Negative.    Cardiovascular: Negative.   Gastrointestinal:  Negative for abdominal distention, abdominal pain, constipation, diarrhea, nausea and vomiting.  Genitourinary:  Positive for dysuria, frequency and urgency.  Musculoskeletal: Negative.   Skin: Negative.     Objective:  Physical Exam Constitutional:      Appearance: She is well-developed.  HENT:     Head: Normocephalic and atraumatic.  Cardiovascular:     Rate and Rhythm: Normal rate and regular rhythm.  Pulmonary:     Effort: Pulmonary effort is normal. No respiratory distress.     Breath sounds: Normal breath sounds. No wheezing or rales.  Abdominal:     General: Bowel sounds are normal. There is no distension.     Palpations: Abdomen is soft.     Tenderness: There is no abdominal tenderness. There is no rebound.  Musculoskeletal:     Cervical back: Normal range of motion.  Skin:    General: Skin is warm and dry.  Neurological:     Mental Status: She is alert and oriented to person, place, and time.     Coordination: Coordination normal.     Vitals:   05/11/22 1446  BP: 120/60  Pulse: 73  Temp: 97.6 F (36.4 C)  TempSrc: Oral  SpO2: 97%  Weight: 134 lb (60.8 kg)  Height: 5' 5.5" (1.664 m)    Assessment & Plan:

## 2022-05-12 DIAGNOSIS — R208 Other disturbances of skin sensation: Secondary | ICD-10-CM | POA: Insufficient documentation

## 2022-05-12 NOTE — Assessment & Plan Note (Signed)
POC U/A done in office consistent with acute cystitis. Rx macrobid 5 day course.

## 2022-05-14 ENCOUNTER — Other Ambulatory Visit: Payer: Self-pay | Admitting: Internal Medicine

## 2022-05-18 DIAGNOSIS — M79672 Pain in left foot: Secondary | ICD-10-CM | POA: Diagnosis not present

## 2022-05-27 DIAGNOSIS — D3131 Benign neoplasm of right choroid: Secondary | ICD-10-CM | POA: Diagnosis not present

## 2022-05-27 DIAGNOSIS — Z79899 Other long term (current) drug therapy: Secondary | ICD-10-CM | POA: Diagnosis not present

## 2022-05-27 DIAGNOSIS — M456 Ankylosing spondylitis lumbar region: Secondary | ICD-10-CM | POA: Diagnosis not present

## 2022-05-27 DIAGNOSIS — H04123 Dry eye syndrome of bilateral lacrimal glands: Secondary | ICD-10-CM | POA: Diagnosis not present

## 2022-06-01 DIAGNOSIS — M79672 Pain in left foot: Secondary | ICD-10-CM | POA: Diagnosis not present

## 2022-06-01 DIAGNOSIS — S93602A Unspecified sprain of left foot, initial encounter: Secondary | ICD-10-CM | POA: Diagnosis not present

## 2022-06-02 DIAGNOSIS — M79672 Pain in left foot: Secondary | ICD-10-CM | POA: Diagnosis not present

## 2022-06-03 ENCOUNTER — Encounter: Payer: Self-pay | Admitting: Internal Medicine

## 2022-06-03 DIAGNOSIS — M79672 Pain in left foot: Secondary | ICD-10-CM | POA: Diagnosis not present

## 2022-06-04 ENCOUNTER — Other Ambulatory Visit: Payer: Self-pay | Admitting: Internal Medicine

## 2022-06-08 DIAGNOSIS — S93602A Unspecified sprain of left foot, initial encounter: Secondary | ICD-10-CM | POA: Diagnosis not present

## 2022-06-08 DIAGNOSIS — M79672 Pain in left foot: Secondary | ICD-10-CM | POA: Diagnosis not present

## 2022-06-29 DIAGNOSIS — S93602A Unspecified sprain of left foot, initial encounter: Secondary | ICD-10-CM | POA: Diagnosis not present

## 2022-07-20 DIAGNOSIS — Z79899 Other long term (current) drug therapy: Secondary | ICD-10-CM | POA: Diagnosis not present

## 2022-07-20 DIAGNOSIS — M1380 Other specified arthritis, unspecified site: Secondary | ICD-10-CM | POA: Diagnosis not present

## 2022-07-20 DIAGNOSIS — M545 Low back pain, unspecified: Secondary | ICD-10-CM | POA: Diagnosis not present

## 2022-07-22 DIAGNOSIS — S93602D Unspecified sprain of left foot, subsequent encounter: Secondary | ICD-10-CM | POA: Diagnosis not present

## 2022-07-26 DIAGNOSIS — R7989 Other specified abnormal findings of blood chemistry: Secondary | ICD-10-CM | POA: Diagnosis not present

## 2022-08-05 DIAGNOSIS — H02402 Unspecified ptosis of left eyelid: Secondary | ICD-10-CM | POA: Diagnosis not present

## 2022-08-05 DIAGNOSIS — H0288A Meibomian gland dysfunction right eye, upper and lower eyelids: Secondary | ICD-10-CM | POA: Diagnosis not present

## 2022-08-05 DIAGNOSIS — H0288B Meibomian gland dysfunction left eye, upper and lower eyelids: Secondary | ICD-10-CM | POA: Diagnosis not present

## 2022-08-08 ENCOUNTER — Other Ambulatory Visit: Payer: Self-pay | Admitting: Cardiovascular Disease

## 2022-09-08 DIAGNOSIS — L821 Other seborrheic keratosis: Secondary | ICD-10-CM | POA: Diagnosis not present

## 2022-09-08 DIAGNOSIS — Z85828 Personal history of other malignant neoplasm of skin: Secondary | ICD-10-CM | POA: Diagnosis not present

## 2022-09-20 ENCOUNTER — Ambulatory Visit: Payer: Medicare PPO | Admitting: Student

## 2022-10-14 DIAGNOSIS — M79672 Pain in left foot: Secondary | ICD-10-CM | POA: Diagnosis not present

## 2022-10-20 NOTE — Progress Notes (Unsigned)
  Electrophysiology Office Note:   Date:  10/21/2022  ID:  [name], DOB 06-Jan-1941, MRN [id number]  Primary Cardiologist: None Electrophysiologist: Lanier Prude, MD   History of Present Illness:   [name] is a 82 y.o. female with h/o PVCs and NSVT seen today for routine electrophysiology followup. Since last being seen in our clinic the patient reports doing very well. She has very rare palpitations. Occasionally wakes up at night, but doesn't always correlate with PVCs on self exam.  she denies chest pain, dyspnea, PND, orthopnea, nausea, vomiting, dizziness, syncope, edema, weight gain, or early satiety.   Review of systems complete and found to be negative unless listed in HPI.   Studies Reviewed:    EKG is ordered today. Personal review shows NSR at 66 bpm     Physical Exam:   VS:  BP 120/70   Pulse 66   Ht 5' 5.5" (1.664 m)   Wt 130 lb (59 kg)   SpO2 97%   BMI 21.30 kg/m    Wt Readings from Last 3 Encounters:  10/21/22 130 lb (59 kg)  05/11/22 134 lb (60.8 kg)  03/18/22 134 lb (60.8 kg)     GEN: Well nourished, well developed in no acute distress NECK: No JVD; No carotid bruits CARDIAC: Regular rate and rhythm, no murmurs, rubs, gallops RESPIRATORY:  Clear to auscultation without rales, wheezing or rhonchi  ABDOMEN: Soft, non-tender, non-distended EXTREMITIES:  No edema; No deformity   ASSESSMENT AND PLAN:    PVCs NSVT Monitor 05/2021 showed much decreased burden on low dose Toprol Did not tolerate amio or higher dose of Toprol Rare symptoms, usually only a couple of times a month, not always with clear PBCs Echo 10/2020 LVEF 55-60%  Follow up with Dr. Lalla Brothers in 12 months  Signed, Graciella Freer, PA-C

## 2022-10-21 ENCOUNTER — Ambulatory Visit: Payer: Medicare PPO | Attending: Student | Admitting: Student

## 2022-10-21 ENCOUNTER — Encounter: Payer: Self-pay | Admitting: Student

## 2022-10-21 VITALS — BP 120/70 | HR 66 | Ht 65.5 in | Wt 130.0 lb

## 2022-10-21 DIAGNOSIS — I493 Ventricular premature depolarization: Secondary | ICD-10-CM | POA: Diagnosis not present

## 2022-10-21 DIAGNOSIS — I472 Ventricular tachycardia, unspecified: Secondary | ICD-10-CM | POA: Diagnosis not present

## 2022-10-21 DIAGNOSIS — M1380 Other specified arthritis, unspecified site: Secondary | ICD-10-CM | POA: Diagnosis not present

## 2022-10-21 DIAGNOSIS — Z79899 Other long term (current) drug therapy: Secondary | ICD-10-CM | POA: Diagnosis not present

## 2022-10-21 NOTE — Patient Instructions (Signed)
Medication Instructions:  Your physician recommends that you continue on your current medications as directed. Please refer to the Current Medication list given to you today.  *If you need a refill on your cardiac medications before your next appointment, please call your pharmacy*   Lab Work: None ordered If you have labs (blood work) drawn today and your tests are completely normal, you will receive your results only by: MyChart Message (if you have MyChart) OR A paper copy in the mail If you have any lab test that is abnormal or we need to change your treatment, we will call you to review the results   Follow-Up: At [facility], you and your health needs are our priority.  As part of our continuing mission to provide you with exceptional heart care, we have created designated Provider Care Teams.  These Care Teams include your primary Cardiologist (physician) and Advanced Practice Providers (APPs -  Physician Assistants and Nurse Practitioners) who all work together to provide you with the care you need, when you need it.  Your next appointment:   1 year(s)  Provider:   Cameron Lambert, MD  

## 2022-11-04 DIAGNOSIS — M79672 Pain in left foot: Secondary | ICD-10-CM | POA: Diagnosis not present

## 2022-11-10 DIAGNOSIS — M79672 Pain in left foot: Secondary | ICD-10-CM | POA: Diagnosis not present

## 2022-11-17 ENCOUNTER — Other Ambulatory Visit: Payer: Self-pay | Admitting: Cardiovascular Disease

## 2022-11-17 DIAGNOSIS — M79672 Pain in left foot: Secondary | ICD-10-CM | POA: Diagnosis not present

## 2022-11-24 DIAGNOSIS — M79672 Pain in left foot: Secondary | ICD-10-CM | POA: Diagnosis not present

## 2022-12-03 DIAGNOSIS — M79672 Pain in left foot: Secondary | ICD-10-CM | POA: Diagnosis not present

## 2022-12-13 DIAGNOSIS — Z01419 Encounter for gynecological examination (general) (routine) without abnormal findings: Secondary | ICD-10-CM | POA: Diagnosis not present

## 2022-12-13 DIAGNOSIS — Z6821 Body mass index (BMI) 21.0-21.9, adult: Secondary | ICD-10-CM | POA: Diagnosis not present

## 2022-12-13 DIAGNOSIS — Z1211 Encounter for screening for malignant neoplasm of colon: Secondary | ICD-10-CM | POA: Diagnosis not present

## 2022-12-13 DIAGNOSIS — N959 Unspecified menopausal and perimenopausal disorder: Secondary | ICD-10-CM | POA: Diagnosis not present

## 2022-12-17 DIAGNOSIS — M79672 Pain in left foot: Secondary | ICD-10-CM | POA: Diagnosis not present

## 2022-12-24 ENCOUNTER — Ambulatory Visit: Payer: Medicare PPO | Attending: Cardiovascular Disease | Admitting: Cardiovascular Disease

## 2022-12-24 ENCOUNTER — Encounter: Payer: Self-pay | Admitting: Cardiovascular Disease

## 2022-12-24 VITALS — BP 104/72 | HR 73 | Ht 65.5 in | Wt 131.0 lb

## 2022-12-24 DIAGNOSIS — I493 Ventricular premature depolarization: Secondary | ICD-10-CM

## 2022-12-24 DIAGNOSIS — E782 Mixed hyperlipidemia: Secondary | ICD-10-CM | POA: Diagnosis not present

## 2022-12-24 DIAGNOSIS — R931 Abnormal findings on diagnostic imaging of heart and coronary circulation: Secondary | ICD-10-CM | POA: Diagnosis not present

## 2022-12-24 MED ORDER — METOPROLOL SUCCINATE ER 25 MG PO TB24: 12.5000 mg | ORAL_TABLET | Freq: Every day | ORAL | 30 refills | Status: DC

## 2022-12-24 NOTE — Assessment & Plan Note (Signed)
History of hyperlipidemia on low-dose rosuvastatin lipid profile performed 03/18/2022 revealing total cholesterol 158, LDL 77 and HDL 63.

## 2022-12-24 NOTE — Assessment & Plan Note (Signed)
History of symptomatic PVCs improved on low-dose beta-blocker.

## 2022-12-24 NOTE — Patient Instructions (Signed)

## 2022-12-24 NOTE — Progress Notes (Signed)
12/24/2022 [name]   [birth year]  [id number]  Primary Physician Myrlene Broker, MD Primary Cardiologist: [name] MD Nicholes Calamity, [place]  HPI:  [name] is a 82 y.o.   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 Vincente Poli , her OB/GYN, for progressive dyspnea.  I last saw her in the office 12/04/2021.  Her only risk factor history of hyperlipidemia.  She is never smoked.  There is no family history.  She is never had a heart attack or stroke.  She is had a right total hip replacement and has difficulties with her left hip scheduled to see Dr. Lequita Halt in the near future.  Since January he has had progressive dyspnea.  She did recently have COVID and was treated with Paxlovid as an outpatient.  I obtained a 2D echocardiogram on her 11/13/2020 which was normal.  A Myoview stress test showed no ischemia but that showed nonsustained ventricular tachycardia.  A coronary calcium score was low at 14.  I did refer her to Dr. Lalla Brothers, electrophysiology, who empirically begun her on low-dose amiodarone which she did not tolerate.  She is currently on metoprolol 25 mg a day which she says is affecting her sleep adversely as well as causing fatigue.  A follow-up event monitor performed by Dr. Lalla Brothers 06/19/2021 showed only rare PVCs.  Since I saw her a year ago she is remained stable.  She denies chest pain, shortness of breath or palpitations.  She is scheduled to go to [place] with her daughter this coming September.   Current Meds  Medication Sig   butalbital-aspirin-caffeine (FIORINAL) 50-325-40 MG capsule Take 1 capsule by mouth every 4 (four) hours as needed for headache.   estradiol (CLIMARA - DOSED IN MG/24 HR) 0.1 mg/24hr patch Climara 0.1 mg/24 hr transdermal patch   fluocinonide (LIDEX) 0.05 % external solution Apply 1 application  topically 2 (two) times daily as needed (for psorasis.).    gabapentin (NEURONTIN) 100 MG capsule Take 3 capsules (300 mg total) by mouth at bedtime. (Patient taking differently: Take 400 mg by mouth at bedtime.)   hydroxychloroquine (PLAQUENIL) 200 MG tablet Take 200 mg by mouth daily.   progesterone (PROMETRIUM) 100 MG capsule Take 100 mg by mouth at bedtime.   rosuvastatin (CRESTOR) 5 MG tablet Take 1 tablet (5 mg total) by mouth daily.   sulfaSALAzine (AZULFIDINE) 500 MG tablet Take 1,000-1,500 mg by mouth 2 (two) times daily. 1000 mg in the am and 1500 mg at night   SYNTHROID 50 MCG tablet Take 1 tablet (50 mcg total) by mouth every morning.   TESTOSTERONE PROPIONATE IJ Place 1 application  onto the skin 3 (three) times a week.   VITAMIN D PO Take 1,000 mg by mouth daily.   [DISCONTINUED] metoprolol succinate (TOPROL-XL) 25 MG 24 hr tablet take 1/2 tablet (12.5mg ) by mouth daily     Allergies  Allergen Reactions   Imitrex [Sumatriptan] Nausea And Vomiting and Hypertension   Nsaids Other (See Comments)    Gerd as result taking.   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   Patient drinks two cups of coffee daily.   Patient is right-handed.    Social Determinants of Health   Financial Resource Strain: Low Risk  (03/04/2022)   Overall Financial Resource Strain (CARDIA)    Difficulty of Paying Living Expenses: Not hard at all  Food Insecurity: No Food Insecurity (03/04/2022)   Hunger Vital Sign    Worried About Running Out of Food in the Last Year: Never true    Ran Out of Food in the Last Year: Never true  Transportation Needs: No Transportation Needs (03/04/2022)   PRAPARE - Administrator, Civil Service (Medical): No    Lack of Transportation (Non-Medical): No  Physical Activity: Sufficiently Active (03/04/2022)   Exercise Vital Sign    Days of Exercise per Week: 5 days    Minutes of Exercise per Session: 40 min  Stress: No Stress Concern Present (03/04/2022)   [employer] of Occupational Health - Occupational Stress Questionnaire    Feeling of Stress : Not at all  Social Connections: Moderately Integrated (03/04/2022)   Social Connection and Isolation Panel [NHANES]    Frequency of Communication with Friends and Family: More than three times a week    Frequency of Social Gatherings with Friends and Family: More than three times a week    Attends Religious Services: Never    Database administrator or Organizations: Yes    Attends Engineer, structural: More than 4 times per year    Marital Status: Married  Catering manager Violence: Not At Risk (03/04/2022)   Humiliation, Afraid, Rape, and Kick questionnaire    Fear of Current or Ex-Partner: No    Emotionally Abused: No    Physically Abused: No    Sexually Abused: No     Review of Systems: General: negative for chills, fever, night sweats or weight changes.  Cardiovascular: negative for chest pain, dyspnea on exertion, edema, orthopnea, palpitations, paroxysmal nocturnal dyspnea or shortness of breath Dermatological: negative for rash Respiratory: negative for cough or wheezing Urologic: negative for hematuria Abdominal: negative for  nausea, vomiting, diarrhea, bright red blood per rectum, melena, or hematemesis Neurologic: negative for visual changes, syncope, or dizziness All other systems reviewed and are otherwise negative except as noted above.    Blood pressure 104/72, pulse 73, height 5' 5.5" (1.664 m), weight 131 lb (59.4 kg), SpO2 99%.  General appearance: alert and no distress Neck: no adenopathy, no carotid bruit, no JVD, supple, symmetrical, trachea midline, and thyroid not enlarged, symmetric, no tenderness/mass/nodules Lungs: clear to auscultation bilaterally Heart: Regular rate and rhythm without murmurs gallops rubs or clicks Extremities: extremities normal, atraumatic, no cyanosis or edema Pulses: 2+ and symmetric Skin: Skin color, texture, turgor normal. No rashes or lesions Neurologic: Grossly normal  EKG not performed today      ASSESSMENT AND PLAN:   Hyperlipidemia History of hyperlipidemia on low-dose rosuvastatin lipid profile performed  03/18/2022 revealing total cholesterol 158, LDL 77 and HDL 63.  PVC's (premature ventricular contractions) History of symptomatic PVCs improved on low-dose beta-blocker.  Elevated coronary artery calcium score Coronary calcium score 02/13/2021 was 14.  She is completely asymptomatic and has acceptable lipid profile for secondary prevention.     [name] MD FACP,FACC,FAHA, [facility] 12/24/2022 9:03 AM

## 2022-12-24 NOTE — Assessment & Plan Note (Signed)
Coronary calcium score 02/13/2021 was 14.  She is completely asymptomatic and has acceptable lipid profile for secondary prevention.

## 2023-01-04 DIAGNOSIS — M545 Low back pain, unspecified: Secondary | ICD-10-CM | POA: Diagnosis not present

## 2023-01-04 DIAGNOSIS — D696 Thrombocytopenia, unspecified: Secondary | ICD-10-CM | POA: Diagnosis not present

## 2023-01-04 DIAGNOSIS — M1380 Other specified arthritis, unspecified site: Secondary | ICD-10-CM | POA: Diagnosis not present

## 2023-01-04 DIAGNOSIS — Z79899 Other long term (current) drug therapy: Secondary | ICD-10-CM | POA: Diagnosis not present

## 2023-01-04 DIAGNOSIS — M0600 Rheumatoid arthritis without rheumatoid factor, unspecified site: Secondary | ICD-10-CM | POA: Diagnosis not present

## 2023-01-04 DIAGNOSIS — M159 Polyosteoarthritis, unspecified: Secondary | ICD-10-CM | POA: Diagnosis not present

## 2023-01-14 DIAGNOSIS — Z79899 Other long term (current) drug therapy: Secondary | ICD-10-CM | POA: Diagnosis not present

## 2023-02-01 ENCOUNTER — Ambulatory Visit: Payer: Medicare PPO | Admitting: Physician Assistant

## 2023-03-21 DIAGNOSIS — E559 Vitamin D deficiency, unspecified: Secondary | ICD-10-CM | POA: Diagnosis not present

## 2023-03-21 DIAGNOSIS — M0600 Rheumatoid arthritis without rheumatoid factor, unspecified site: Secondary | ICD-10-CM | POA: Diagnosis not present

## 2023-03-21 DIAGNOSIS — Z1331 Encounter for screening for depression: Secondary | ICD-10-CM | POA: Diagnosis not present

## 2023-03-21 DIAGNOSIS — G44209 Tension-type headache, unspecified, not intractable: Secondary | ICD-10-CM | POA: Diagnosis not present

## 2023-03-21 DIAGNOSIS — E7800 Pure hypercholesterolemia, unspecified: Secondary | ICD-10-CM | POA: Diagnosis not present

## 2023-03-21 DIAGNOSIS — E039 Hypothyroidism, unspecified: Secondary | ICD-10-CM | POA: Diagnosis not present

## 2023-03-21 DIAGNOSIS — K219 Gastro-esophageal reflux disease without esophagitis: Secondary | ICD-10-CM | POA: Diagnosis not present

## 2023-03-21 DIAGNOSIS — Z0000 Encounter for general adult medical examination without abnormal findings: Secondary | ICD-10-CM | POA: Diagnosis not present

## 2023-03-21 DIAGNOSIS — Z23 Encounter for immunization: Secondary | ICD-10-CM | POA: Diagnosis not present

## 2023-03-29 ENCOUNTER — Encounter: Payer: Self-pay | Admitting: Gastroenterology

## 2023-03-29 ENCOUNTER — Ambulatory Visit: Payer: Medicare PPO | Admitting: Gastroenterology

## 2023-03-29 VITALS — BP 90/62 | HR 76 | Ht 64.5 in | Wt 132.4 lb

## 2023-03-29 DIAGNOSIS — K594 Anal spasm: Secondary | ICD-10-CM

## 2023-03-29 DIAGNOSIS — R195 Other fecal abnormalities: Secondary | ICD-10-CM

## 2023-03-29 DIAGNOSIS — Z800 Family history of malignant neoplasm of digestive organs: Secondary | ICD-10-CM

## 2023-03-29 NOTE — Progress Notes (Signed)
Chief Complaint:    Change in stools   HPI:     Patient is a 82 y.o. female with history of history of psoriatic arthritis for which she is on Azulfidine, has history of ruptured appendix at age 37, is status post cholecystectomy, history migraine headaches hypothyroidism, presenting to the Gastroenterology Clinic for follow-up and to discuss changes in bowel habits.  She has family history of colon cancer in her mother, diagnosed in her early 20s.  Patient was last seen in the office in by Amy Esterwood in 01/2022.  At that time she was having some very mild changes in bowel habits, but not necessarily diarrhea/constipation.  No overt bleeding.  Last colonoscopy was done in November 2013 per Dr. Christella Hartigan with finding of a very tortuous colon resulting in significant looping and an incomplete exam. She subsequently had CT colonography which showed bilateral renal stones, and a low-attenuation area in the hepatic dome, no colonic abnormalities noted. Subsequent MRI of the liver showed the area in the hepatic dome to be a benign hemangioma.    Reviewed findings from repeat virtual colonoscopy in 02/2022 which was normal and without any polyps.  Today, she states since her last OV in the GI clinic she continued to have thin caliber stools.  Was initially intermittent, but was then occurring regularly for 6 months or so.  Over the last couple of months, stools have improved and nearly back to baseline caliber.  Still with increased gas, bloating, and occasional feeling of incomplete evacuation.  The symptoms are not terribly bothersome to her.  More bothersome has been recent episodic intense spasm pain in the anal canal.  Tends to occur at nighttime and has awoken her from sleep.  Symptoms tend to last  <5 minutes, and improved with heating pad.  No associated dyschezia, hematochezia, melena.   Bowel habits improve with dates, prunes. Eats high fiber, Mediterranean diet; has not tried a fiber  supplement.   Review of systems:     No chest pain, no SOB, no fevers, no urinary sx   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HCC)     dr Vincente Poli, [facility] now resolved   Anemia    iron   Arthritis    psoriatic  in hips oa in hands   Blood transfusion without reported diagnosis 1978   Cataract    chronic headaches    histamine migraines, rarely occur   Chronic kidney disease    hx bladder infections yrs ago   Colon polyp yrs ago   Dysrhythmia 1996 to 1999   wore holter monitor result of accidental fall, occasional irregular pulse   Family history of adverse reaction to anesthesia    father had aspiration after anesthesia one time   Gastric polyps    GERD (gastroesophageal reflux disease)    History of migraine headaches    Hyperlipidemia    Hypothyroidism    Neuromuscular disease (HCC)    fibromyalgia   Pneumonia 2005aspiration pneumonia   stopped when got pneumonia shot, also 2015 regular pneumonia   PONV (postoperative nausea and vomiting)    patient wishes to have no anesthetics that could affect memory due to past memory issues, no cause found   Psoriatic arthritis (HCC)    PVC (premature ventricular contraction)     Patient's surgical history, family medical history, social history, medications and allergies were all reviewed in Epic    Current Outpatient Medications  Medication Sig Dispense Refill  butalbital-aspirin-caffeine (FIORINAL) 50-325-40 MG capsule Take 1 capsule by mouth every 4 (four) hours as needed for headache. 30 capsule 0   clobetasol (TEMOVATE) 0.05 % external solution Apply 1 Application topically as needed.     estradiol (CLIMARA - DOSED IN MG/24 HR) 0.1 mg/24hr patch Climara 0.1 mg/24 hr transdermal patch     fluocinonide (LIDEX) 0.05 % external solution Apply 1 application topically 2 (two) times daily as needed (for psorasis.).      gabapentin (NEURONTIN) 100 MG capsule Take 3 capsules (300 mg total) by mouth at  bedtime. (Patient taking differently: Take 400 mg by mouth at bedtime.) 90 capsule 5   hydroxychloroquine (PLAQUENIL) 200 MG tablet Take 200 mg by mouth daily.     metoprolol succinate (TOPROL-XL) 25 MG 24 hr tablet Take 0.5 tablets (12.5 mg total) by mouth daily. 45 tablet 30   progesterone (PROMETRIUM) 100 MG capsule Take 100 mg by mouth at bedtime.     rosuvastatin (CRESTOR) 5 MG tablet Take 1 tablet (5 mg total) by mouth daily. 90 tablet 2   sulfaSALAzine (AZULFIDINE) 500 MG tablet Take 1,000-1,500 mg by mouth 2 (two) times daily. 1000 mg in the am and 1500 mg at night     SYNTHROID 50 MCG tablet Take 1 tablet (50 mcg total) by mouth every morning. 90 tablet 3   TESTOSTERONE PROPIONATE IJ Place 1 application  onto the skin 3 (three) times a week.     VITAMIN D PO Take 1,000 mg by mouth daily.     No current facility-administered medications for this visit.    Physical Exam:     BP 90/62 (BP Location: Left Arm, Patient Position: Sitting, Cuff Size: Normal)   Pulse 76 Comment: irregular  Ht 5' 4.5" (1.638 m)   Wt 132 lb 6 oz (60 kg)   BMI 22.37 kg/m   GENERAL:  Pleasant female in NAD PSYCH: : Cooperative, normal affect NEURO: Alert and oriented x 3, no focal neurologic deficits   IMPRESSION and PLAN:    1) Proctalgia fugax Discussed pathophysiology of proctalgia fugax with patient at length today. - Provided patient education and reassurance - Conservative management - Use of heating pad prn since this tends to quell symptoms  2) Change in bowel habits Was having thin caliber stools, but those symptoms have nearly resolved.  Describes symptoms as minimally bothersome now. - Trial fiber supplement - Continue healthy eating habits - Did have reversal colonoscopy in 2023 which was otherwise normal and quite reassuring.  Do not think we need to try to pursue optical colonoscopy given overall clinical improvement and recent CTC - If recurrence of symptoms, could consider stool  studies, abdominal x-ray, or possibly repeat CT  3) Family history of colon cancer Mother was diagnosed with colon cancer in her early 32s.  UTD on CRC screening having completed virtual colonoscopy in 2023.  RTC prn     I spent 35 minutes of time, including in depth chart review, independent review of results as outlined above, communicating results with the patient directly, face-to-face time with the patient, coordinating care, and ordering studies and medications as appropriate, and documentation.      Shellia Cleverly ,DO, FACG 03/29/2023, 2:28 PM

## 2023-03-29 NOTE — Patient Instructions (Addendum)
_______________________________________________________  If your blood pressure at your visit was 140/90 or greater, please contact your primary care physician to follow up on this. _______________________________________________________  If you are age 80 or older, your body mass index should be between 23-30. Your Body mass index is 22.37 kg/m. If this is out of the aforementioned range listed, please consider follow up with your Primary Care Provider. ________________________________________________________  [name] will follow up in our office on an as needed basis.   The [place] [facility] providers would like to encourage you to use [facility] to communicate with providers for non-urgent requests or questions.  Due to long hold times on the telephone, sending your provider a message by [facility] may be a faster and more efficient way to get a response.  Please allow 48 business hours for a response.  Please remember that this is for non-urgent requests.  _______________________________________________________  It was a pleasure to see you today!  Vito Cirigliano, D.O.

## 2023-04-13 DIAGNOSIS — M1990 Unspecified osteoarthritis, unspecified site: Secondary | ICD-10-CM | POA: Diagnosis not present

## 2023-04-13 DIAGNOSIS — K219 Gastro-esophageal reflux disease without esophagitis: Secondary | ICD-10-CM | POA: Diagnosis not present

## 2023-04-13 DIAGNOSIS — I493 Ventricular premature depolarization: Secondary | ICD-10-CM | POA: Diagnosis not present

## 2023-04-13 DIAGNOSIS — E785 Hyperlipidemia, unspecified: Secondary | ICD-10-CM | POA: Diagnosis not present

## 2023-04-13 DIAGNOSIS — R32 Unspecified urinary incontinence: Secondary | ICD-10-CM | POA: Diagnosis not present

## 2023-04-13 DIAGNOSIS — E039 Hypothyroidism, unspecified: Secondary | ICD-10-CM | POA: Diagnosis not present

## 2023-04-13 DIAGNOSIS — N182 Chronic kidney disease, stage 2 (mild): Secondary | ICD-10-CM | POA: Diagnosis not present

## 2023-04-13 DIAGNOSIS — D84821 Immunodeficiency due to drugs: Secondary | ICD-10-CM | POA: Diagnosis not present

## 2023-04-13 DIAGNOSIS — L4050 Arthropathic psoriasis, unspecified: Secondary | ICD-10-CM | POA: Diagnosis not present

## 2023-05-03 ENCOUNTER — Other Ambulatory Visit: Payer: Self-pay

## 2023-05-03 ENCOUNTER — Emergency Department (HOSPITAL_COMMUNITY): Payer: Medicare PPO

## 2023-05-03 ENCOUNTER — Inpatient Hospital Stay (HOSPITAL_COMMUNITY)
Admission: EM | Admit: 2023-05-03 | Discharge: 2023-05-07 | DRG: 195 | Disposition: A | Discharge status: Home / Self Care | Payer: Medicare PPO | Attending: Internal Medicine | Admitting: Internal Medicine

## 2023-05-03 ENCOUNTER — Encounter (HOSPITAL_COMMUNITY): Payer: Self-pay

## 2023-05-03 DIAGNOSIS — Z833 Family history of diabetes mellitus: Secondary | ICD-10-CM | POA: Diagnosis not present

## 2023-05-03 DIAGNOSIS — Z881 Allergy status to other antibiotic agents status: Secondary | ICD-10-CM

## 2023-05-03 DIAGNOSIS — R918 Other nonspecific abnormal finding of lung field: Secondary | ICD-10-CM | POA: Diagnosis not present

## 2023-05-03 DIAGNOSIS — Z9049 Acquired absence of other specified parts of digestive tract: Secondary | ICD-10-CM

## 2023-05-03 DIAGNOSIS — Z888 Allergy status to other drugs, medicaments and biological substances status: Secondary | ICD-10-CM

## 2023-05-03 DIAGNOSIS — J9811 Atelectasis: Secondary | ICD-10-CM | POA: Diagnosis not present

## 2023-05-03 DIAGNOSIS — J984 Other disorders of lung: Secondary | ICD-10-CM | POA: Diagnosis not present

## 2023-05-03 DIAGNOSIS — E785 Hyperlipidemia, unspecified: Secondary | ICD-10-CM | POA: Diagnosis present

## 2023-05-03 DIAGNOSIS — Z8249 Family history of ischemic heart disease and other diseases of the circulatory system: Secondary | ICD-10-CM

## 2023-05-03 DIAGNOSIS — I700 Atherosclerosis of aorta: Secondary | ICD-10-CM | POA: Diagnosis not present

## 2023-05-03 DIAGNOSIS — Z96641 Presence of right artificial hip joint: Secondary | ICD-10-CM | POA: Diagnosis present

## 2023-05-03 DIAGNOSIS — J111 Influenza due to unidentified influenza virus with other respiratory manifestations: Secondary | ICD-10-CM | POA: Diagnosis not present

## 2023-05-03 DIAGNOSIS — G43909 Migraine, unspecified, not intractable, without status migrainosus: Secondary | ICD-10-CM | POA: Diagnosis present

## 2023-05-03 DIAGNOSIS — E039 Hypothyroidism, unspecified: Secondary | ICD-10-CM | POA: Diagnosis present

## 2023-05-03 DIAGNOSIS — R9431 Abnormal electrocardiogram [ECG] [EKG]: Secondary | ICD-10-CM | POA: Diagnosis not present

## 2023-05-03 DIAGNOSIS — Z800 Family history of malignant neoplasm of digestive organs: Secondary | ICD-10-CM | POA: Diagnosis not present

## 2023-05-03 DIAGNOSIS — I493 Ventricular premature depolarization: Secondary | ICD-10-CM | POA: Diagnosis present

## 2023-05-03 DIAGNOSIS — Z79890 Hormone replacement therapy (postmenopausal): Secondary | ICD-10-CM

## 2023-05-03 DIAGNOSIS — J439 Emphysema, unspecified: Secondary | ICD-10-CM | POA: Diagnosis present

## 2023-05-03 DIAGNOSIS — J101 Influenza due to other identified influenza virus with other respiratory manifestations: Secondary | ICD-10-CM | POA: Diagnosis present

## 2023-05-03 DIAGNOSIS — I6782 Cerebral ischemia: Secondary | ICD-10-CM | POA: Diagnosis not present

## 2023-05-03 DIAGNOSIS — Z803 Family history of malignant neoplasm of breast: Secondary | ICD-10-CM

## 2023-05-03 DIAGNOSIS — R0902 Hypoxemia: Secondary | ICD-10-CM | POA: Diagnosis not present

## 2023-05-03 DIAGNOSIS — R0602 Shortness of breath: Secondary | ICD-10-CM | POA: Diagnosis present

## 2023-05-03 DIAGNOSIS — G43809 Other migraine, not intractable, without status migrainosus: Secondary | ICD-10-CM | POA: Diagnosis not present

## 2023-05-03 DIAGNOSIS — J449 Chronic obstructive pulmonary disease, unspecified: Secondary | ICD-10-CM | POA: Diagnosis not present

## 2023-05-03 DIAGNOSIS — I10 Essential (primary) hypertension: Secondary | ICD-10-CM | POA: Diagnosis present

## 2023-05-03 DIAGNOSIS — R059 Cough, unspecified: Secondary | ICD-10-CM | POA: Diagnosis not present

## 2023-05-03 DIAGNOSIS — R519 Headache, unspecified: Secondary | ICD-10-CM | POA: Diagnosis not present

## 2023-05-03 DIAGNOSIS — E860 Dehydration: Secondary | ICD-10-CM | POA: Diagnosis present

## 2023-05-03 DIAGNOSIS — R079 Chest pain, unspecified: Secondary | ICD-10-CM | POA: Diagnosis not present

## 2023-05-03 DIAGNOSIS — Z1152 Encounter for screening for COVID-19: Secondary | ICD-10-CM | POA: Diagnosis not present

## 2023-05-03 DIAGNOSIS — E782 Mixed hyperlipidemia: Secondary | ICD-10-CM

## 2023-05-03 DIAGNOSIS — R531 Weakness: Secondary | ICD-10-CM | POA: Diagnosis not present

## 2023-05-03 DIAGNOSIS — Z79899 Other long term (current) drug therapy: Secondary | ICD-10-CM

## 2023-05-03 DIAGNOSIS — L4050 Arthropathic psoriasis, unspecified: Secondary | ICD-10-CM | POA: Diagnosis present

## 2023-05-03 DIAGNOSIS — Z886 Allergy status to analgesic agent status: Secondary | ICD-10-CM

## 2023-05-03 DIAGNOSIS — M797 Fibromyalgia: Secondary | ICD-10-CM | POA: Diagnosis present

## 2023-05-03 DIAGNOSIS — Z818 Family history of other mental and behavioral disorders: Secondary | ICD-10-CM | POA: Diagnosis not present

## 2023-05-03 DIAGNOSIS — Z90710 Acquired absence of both cervix and uterus: Secondary | ICD-10-CM | POA: Diagnosis not present

## 2023-05-03 DIAGNOSIS — I672 Cerebral atherosclerosis: Secondary | ICD-10-CM | POA: Diagnosis not present

## 2023-05-03 DIAGNOSIS — K219 Gastro-esophageal reflux disease without esophagitis: Secondary | ICD-10-CM | POA: Diagnosis present

## 2023-05-03 DIAGNOSIS — J09X1 Influenza due to identified novel influenza A virus with pneumonia: Secondary | ICD-10-CM | POA: Insufficient documentation

## 2023-05-03 LAB — COMPREHENSIVE METABOLIC PANEL
ALT: 14 U/L (ref 0–44)
AST: 21 U/L (ref 15–41)
Albumin: 3.7 g/dL (ref 3.5–5.0)
Alkaline Phosphatase: 51 U/L (ref 38–126)
Anion gap: 9 (ref 5–15)
BUN: 8 mg/dL (ref 8–23)
CO2: 23 mmol/L (ref 22–32)
Calcium: 8.5 mg/dL — ABNORMAL LOW (ref 8.9–10.3)
Chloride: 106 mmol/L (ref 98–111)
Creatinine, Ser: 0.74 mg/dL (ref 0.44–1.00)
GFR, Estimated: >60 mL/min (ref >60)
Glucose, Bld: 107 mg/dL — ABNORMAL HIGH (ref 70–99)
Potassium: 3.5 mmol/L (ref 3.5–5.1)
Sodium: 138 mmol/L (ref 135–145)
Total Bilirubin: 1.2 mg/dL — ABNORMAL HIGH (ref <1.2)
Total Protein: 6.3 g/dL — ABNORMAL LOW (ref 6.5–8.1)

## 2023-05-03 LAB — CBC WITH DIFFERENTIAL/PLATELET
Abs Immature Granulocytes: 0.01 10*3/uL (ref 0.00–0.07)
Basophils Absolute: 0 10*3/uL (ref 0.0–0.1)
Basophils Relative: 0 %
Eosinophils Absolute: 0 10*3/uL (ref 0.0–0.5)
Eosinophils Relative: 0 %
HCT: 40.1 % (ref 36.0–46.0)
Hemoglobin: 12.8 g/dL (ref 12.0–15.0)
Immature Granulocytes: 0 %
Lymphocytes Relative: 6 %
Lymphs Abs: 0.4 10*3/uL — ABNORMAL LOW (ref 0.7–4.0)
MCH: 32.8 pg (ref 26.0–34.0)
MCHC: 31.9 g/dL (ref 30.0–36.0)
MCV: 102.8 fL — ABNORMAL HIGH (ref 80.0–100.0)
Monocytes Absolute: 0.4 10*3/uL (ref 0.1–1.0)
Monocytes Relative: 6 %
Neutro Abs: 5.5 10*3/uL (ref 1.7–7.7)
Neutrophils Relative %: 88 %
Platelets: 106 10*3/uL — ABNORMAL LOW (ref 150–400)
RBC: 3.9 MIL/uL (ref 3.87–5.11)
RDW: 13.2 % (ref 11.5–15.5)
WBC: 6.3 10*3/uL (ref 4.0–10.5)
nRBC: 0 % (ref 0.0–0.2)

## 2023-05-03 LAB — BLOOD GAS, VENOUS
Acid-base deficit: 1.4 mmol/L (ref 0.0–2.0)
Bicarbonate: 22.8 mmol/L (ref 20.0–28.0)
O2 Saturation: 77.9 %
Patient temperature: 36.3
pCO2, Ven: 35 mm[Hg] — ABNORMAL LOW (ref 44–60)
pH, Ven: 7.42 (ref 7.25–7.43)
pO2, Ven: 43 mm[Hg] (ref 32–45)

## 2023-05-03 LAB — TROPONIN I (HIGH SENSITIVITY)
Troponin I (High Sensitivity): 7 ng/L (ref <18)
Troponin I (High Sensitivity): 6 ng/L (ref <18)

## 2023-05-03 LAB — PROCALCITONIN: Procalcitonin: <0.1 ng/mL

## 2023-05-03 LAB — HEPATIC FUNCTION PANEL
ALT: 15 U/L (ref 0–44)
AST: 21 U/L (ref 15–41)
Albumin: 3.6 g/dL (ref 3.5–5.0)
Alkaline Phosphatase: 50 U/L (ref 38–126)
Bilirubin, Direct: 0.3 mg/dL — ABNORMAL HIGH (ref 0.0–0.2)
Indirect Bilirubin: 1 mg/dL — ABNORMAL HIGH (ref 0.3–0.9)
Total Bilirubin: 1.3 mg/dL — ABNORMAL HIGH (ref <1.2)
Total Protein: 6.2 g/dL — ABNORMAL LOW (ref 6.5–8.1)

## 2023-05-03 LAB — RESP PANEL BY RT-PCR (RSV, FLU A&B, COVID)  RVPGX2
Influenza A by PCR: POSITIVE — AB
Influenza B by PCR: NEGATIVE
Resp Syncytial Virus by PCR: NEGATIVE
SARS Coronavirus 2 by RT PCR: NEGATIVE

## 2023-05-03 LAB — PHOSPHORUS: Phosphorus: 2.6 mg/dL (ref 2.5–4.6)

## 2023-05-03 LAB — I-STAT CG4 LACTIC ACID, ED: Lactic Acid, Venous: 1 mmol/L (ref 0.5–1.9)

## 2023-05-03 LAB — MAGNESIUM: Magnesium: 1.8 mg/dL (ref 1.7–2.4)

## 2023-05-03 LAB — TSH: TSH: 0.698 u[IU]/mL (ref 0.350–4.500)

## 2023-05-03 LAB — CK: Total CK: 47 U/L (ref 38–234)

## 2023-05-03 MED ORDER — ROSUVASTATIN CALCIUM 5 MG PO TABS
5.0000 mg | ORAL_TABLET | Freq: Every day | ORAL | Status: DC
  Administered 2023-05-04 – 2023-05-07 (×4): 5 mg via ORAL
  Filled 2023-05-03 (×4): qty 1

## 2023-05-03 MED ORDER — ONDANSETRON HCL 4 MG PO TABS: 4.0000 mg | ORAL_TABLET | Freq: Four times a day (QID) | ORAL | Status: DC | PRN

## 2023-05-03 MED ORDER — ALBUTEROL SULFATE (2.5 MG/3ML) 0.083% IN NEBU: 2.5000 mg | INHALATION_SOLUTION | RESPIRATORY_TRACT | Status: DC | PRN

## 2023-05-03 MED ORDER — FENTANYL CITRATE PF 50 MCG/ML IJ SOSY: 12.5000 ug | PREFILLED_SYRINGE | INTRAMUSCULAR | Status: DC | PRN

## 2023-05-03 MED ORDER — METOCLOPRAMIDE HCL 5 MG/ML IJ SOLN: 5.0000 mg | Freq: Four times a day (QID) | INTRAMUSCULAR | Status: DC | PRN

## 2023-05-03 MED ORDER — OSELTAMIVIR PHOSPHATE 75 MG PO CAPS
75.0000 mg | ORAL_CAPSULE | Freq: Once | ORAL | Status: AC
  Administered 2023-05-03: 75 mg via ORAL
  Filled 2023-05-03: qty 1

## 2023-05-03 MED ORDER — IPRATROPIUM-ALBUTEROL 0.5-2.5 (3) MG/3ML IN SOLN
3.0000 mL | Freq: Once | RESPIRATORY_TRACT | Status: AC
  Administered 2023-05-03: 3 mL via RESPIRATORY_TRACT
  Filled 2023-05-03: qty 3

## 2023-05-03 MED ORDER — SODIUM CHLORIDE 0.9 % IV SOLN: INTRAVENOUS | Status: AC

## 2023-05-03 MED ORDER — GABAPENTIN 400 MG PO CAPS
400.0000 mg | ORAL_CAPSULE | Freq: Every day | ORAL | Status: DC
Start: 2023-05-04 — End: 2023-05-07
  Administered 2023-05-04 – 2023-05-06 (×4): 400 mg via ORAL
  Filled 2023-05-03 (×4): qty 1

## 2023-05-03 MED ORDER — METHOCARBAMOL 1000 MG/10ML IJ SOLN: 500.0000 mg | Freq: Four times a day (QID) | INTRAMUSCULAR | Status: DC | PRN

## 2023-05-03 MED ORDER — ACETAMINOPHEN 325 MG PO TABS
650.0000 mg | ORAL_TABLET | Freq: Four times a day (QID) | ORAL | Status: DC | PRN
Start: 2023-05-03 — End: 2023-05-07
  Administered 2023-05-04 – 2023-05-07 (×7): 650 mg via ORAL
  Filled 2023-05-03 (×7): qty 2

## 2023-05-03 MED ORDER — LEVOTHYROXINE SODIUM 50 MCG PO TABS
50.0000 ug | ORAL_TABLET | Freq: Every day | ORAL | Status: DC
  Administered 2023-05-04 – 2023-05-07 (×4): 50 ug via ORAL
  Filled 2023-05-03 (×4): qty 1

## 2023-05-03 MED ORDER — DIPHENHYDRAMINE HCL 50 MG/ML IJ SOLN
12.5000 mg | Freq: Once | INTRAMUSCULAR | Status: AC
  Administered 2023-05-04: 12.5 mg via INTRAVENOUS
  Filled 2023-05-03: qty 1

## 2023-05-03 MED ORDER — OSELTAMIVIR PHOSPHATE 30 MG PO CAPS
30.0000 mg | ORAL_CAPSULE | Freq: Two times a day (BID) | ORAL | Status: DC
  Administered 2023-05-04 – 2023-05-07 (×7): 30 mg via ORAL
  Filled 2023-05-03 (×8): qty 1

## 2023-05-03 MED ORDER — GUAIFENESIN ER 600 MG PO TB12
600.0000 mg | ORAL_TABLET | Freq: Two times a day (BID) | ORAL | Status: DC
  Administered 2023-05-04 – 2023-05-07 (×8): 600 mg via ORAL
  Filled 2023-05-03 (×8): qty 1

## 2023-05-03 MED ORDER — ACETAMINOPHEN 650 MG RE SUPP: 650.0000 mg | Freq: Four times a day (QID) | RECTAL | Status: DC | PRN

## 2023-05-03 MED ORDER — IOHEXOL 350 MG/ML SOLN
75.0000 mL | Freq: Once | INTRAVENOUS | Status: AC | PRN
  Administered 2023-05-03: 75 mL via INTRAVENOUS

## 2023-05-03 MED ORDER — HYDROCODONE-ACETAMINOPHEN 5-325 MG PO TABS
1.0000 | ORAL_TABLET | ORAL | Status: DC | PRN
  Administered 2023-05-04: 2 via ORAL
  Filled 2023-05-03: qty 2

## 2023-05-03 MED ORDER — ONDANSETRON HCL 4 MG/2ML IJ SOLN
4.0000 mg | Freq: Four times a day (QID) | INTRAMUSCULAR | Status: DC | PRN
  Administered 2023-05-04 – 2023-05-05 (×2): 4 mg via INTRAVENOUS
  Filled 2023-05-03 (×2): qty 2

## 2023-05-03 NOTE — Assessment & Plan Note (Signed)
Will rehydrate and follow fluid status °

## 2023-05-03 NOTE — Assessment & Plan Note (Signed)
Continue tamiflu

## 2023-05-03 NOTE — ED Notes (Signed)
ED TO INPATIENT HANDOFF REPORT  ED Nurse Name and Phone #: [phone]  S Name/Age/Gender [name] 82 y.o. female Room/Bed: 015C/015C  Code Status   Code Status: Prior  Home/SNF/Other Home Patient oriented to: self, place, time, and situation Is this baseline? Yes   Triage Complete: Triage complete  Chief Complaint Influenza A [J10.1]  Triage Note Patient arrived by [facility] with ongoing dry cough since traveling on plane. Complains of weakness and concerned for pneumonia. Using otc meds with no improvement. Sat 92% per ems and placed on 2l. Alert and oriented No cp/ no sob   Allergies Allergies  Allergen Reactions   Imitrex [Sumatriptan] Nausea And Vomiting and Hypertension   Nsaids Other (See Comments)    Gerd as result taking.   Ciprofloxacin Hcl    Keflex [Cephalexin] Nausea And Vomiting    Level of Care/Admitting Diagnosis ED Disposition     ED Disposition  Admit   Condition  --   Comment  Hospital Area: [facility] [[id number]]  Level of Care: Telemetry Medical [104]  May place patient in observation at [facility] or [facility] if equivalent level of care is available:: No  Covid Evaluation: Asymptomatic - no recent exposure (last 10 days) testing not required  Diagnosis: Influenza A [546270]  Admitting Physician: Therisa Doyne [[id number]]  Attending Physician: Therisa Doyne [[id number]]          B Medical/Surgery History 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HCC)     dr Vincente Poli, [facility] B now resolved   Anemia    iron   Arthritis    psoriatic  in hips oa in hands   Blood transfusion without reported diagnosis 1978   Cataract    chronic headaches    histamine migraines, rarely occur   Chronic kidney disease    hx bladder infections yrs ago   Colon polyp yrs ago   Dysrhythmia 1996 to 1999   wore holter monitor result of accidental fall, occasional irregular pulse   Family history of adverse  reaction to anesthesia    father had aspiration after anesthesia one time   Gastric polyps    GERD (gastroesophageal reflux disease)    History of migraine headaches    Hyperlipidemia    Hypothyroidism    Neuromuscular disease (HCC)    fibromyalgia   Pneumonia 2005aspiration pneumonia   stopped when got pneumonia shot, also 2015 regular pneumonia   PONV (postoperative nausea and vomiting)    patient wishes to have no anesthetics that could affect memory due to past memory issues, no cause found   Psoriatic arthritis (HCC)    PVC (premature ventricular contraction)    Past Surgical History:  Procedure Laterality Date   ABDOMINAL HYSTERECTOMY     left ovary left and tube left   APPENDECTOMY     with hysterectomy   CARDIAC CATHETERIZATION     CATARACT EXTRACTION, BILATERAL     CHOLECYSTECTOMY     COLONOSCOPY     FINGER SURGERY     middle finger right hand   INCONTINENCE SURGERY     bladder sling   RECTOCELE REPAIR     RHINOPLASTY     SHOULDER ARTHROSCOPY WITH ROTATOR CUFF REPAIR AND SUBACROMIAL DECOMPRESSION Right 06/17/2015   Procedure: SHOULDER DIAGNOSTIC OPERATIVE ARTHROSCOPY WITH MINI-OPEN ROTATOR CUFF REPAIR AND SUBACROMIAL DECOMPRESSION;  Surgeon: Cammy Copa, MD;  Location: [facility] OR;  Service: Orthopedics;  Laterality: Right;   TONSILLECTOMY  age 41  adenoids also   TOTAL HIP ARTHROPLASTY Right 07/06/2017   Procedure: RIGHT TOTAL HIP ARTHROPLASTY ANTERIOR APPROACH;  Surgeon: Ollen Gross, MD;  Location: [facility] ORS;  Service: Orthopedics;  Laterality: Right;     A IV Location/Drains/Wounds Patient Lines/Drains/Airways Status     Active Line/Drains/Airways     Name Placement date Placement time Site Days   Peripheral IV 05/03/23 18 G 1" Anterior;Right Forearm 05/03/23  1655  Forearm  less than 1   Incision (Closed) 06/17/15 Shoulder Right 06/17/15  1004  -- 2877   Incision (Closed) 07/06/17 Hip Right 07/06/17  0909  -- 2127            Intake/Output Last 24  hours No intake or output data in the 24 hours ending 05/03/23 2223  Labs/Imaging Results for orders placed or performed during the hospital encounter of 05/03/23 (from the past 48 hour(s))  Comprehensive metabolic panel     Status: Abnormal   Collection Time: 05/03/23  4:41 PM  Result Value Ref Range   Sodium 138 135 - 145 mmol/L   Potassium 3.5 3.5 - 5.1 mmol/L   Chloride 106 98 - 111 mmol/L   CO2 23 22 - 32 mmol/L   Glucose, Bld 107 (H) 70 - 99 mg/dL    Comment: Glucose reference range applies only to samples taken after fasting for at least 8 hours.   BUN 8 8 - 23 mg/dL   Creatinine, Ser 5.28 0.44 - 1.00 mg/dL   Calcium 8.5 (L) 8.9 - 10.3 mg/dL   Total Protein 6.3 (L) 6.5 - 8.1 g/dL   Albumin 3.7 3.5 - 5.0 g/dL   AST 21 15 - 41 U/L   ALT 14 0 - 44 U/L   Alkaline Phosphatase 51 38 - 126 U/L   Total Bilirubin 1.2 (H) <1.2 mg/dL   GFR, Estimated >41 >32 mL/min    Comment: (NOTE) Calculated using the CKD-EPI Creatinine Equation (2021)    Anion gap 9 5 - 15    Comment: Performed at [facility], [address], [place], [place] [address]  CBC with Differential     Status: Abnormal   Collection Time: 05/03/23  4:41 PM  Result Value Ref Range   WBC 6.3 4.0 - 10.5 K/uL   RBC 3.90 3.87 - 5.11 MIL/uL   Hemoglobin 12.8 12.0 - 15.0 g/dL   HCT 27.2 53.6 - 64.4 %   MCV 102.8 (H) 80.0 - 100.0 fL   MCH 32.8 26.0 - 34.0 pg   MCHC 31.9 30.0 - 36.0 g/dL   RDW 03.4 74.2 - 59.5 %   Platelets 106 (L) 150 - 400 K/uL    Comment: REPEATED TO VERIFY   nRBC 0.0 0.0 - 0.2 %   Neutrophils Relative % 88 %   Neutro Abs 5.5 1.7 - 7.7 K/uL   Lymphocytes Relative 6 %   Lymphs Abs 0.4 (L) 0.7 - 4.0 K/uL   Monocytes Relative 6 %   Monocytes Absolute 0.4 0.1 - 1.0 K/uL   Eosinophils Relative 0 %   Eosinophils Absolute 0.0 0.0 - 0.5 K/uL   Basophils Relative 0 %   Basophils Absolute 0.0 0.0 - 0.1 K/uL   Immature Granulocytes 0 %   Abs Immature Granulocytes 0.01 0.00 - 0.07 K/uL    Comment:  Performed at [facility], [address], [place], [place] [address]  Troponin I (High Sensitivity)     Status: None   Collection Time: 05/03/23  4:41 PM  Result Value Ref Range   Troponin I (High Sensitivity) 7 <18 ng/L    Comment: (NOTE) Elevated high sensitivity troponin I (hsTnI) values and significant  changes across serial measurements may suggest ACS but many other  chronic and acute conditions are known to elevate hsTnI results.  Refer to the "Links" section for chest pain algorithms and additional  guidance. Performed at [facility], [address], [place], [place] [address]   Resp panel by RT-PCR (RSV, Flu A&B, Covid) Anterior Nasal Swab     Status: Abnormal   Collection Time: 05/03/23  5:00 PM   Specimen: Anterior Nasal Swab  Result Value Ref Range   SARS Coronavirus 2 by RT PCR NEGATIVE NEGATIVE   Influenza A by PCR POSITIVE (A) NEGATIVE   Influenza B by PCR NEGATIVE NEGATIVE    Comment: (NOTE) The Xpert Xpress SARS-CoV-2/FLU/RSV plus assay is intended as an aid in the diagnosis of influenza from Nasopharyngeal swab specimens and should not be used as a sole basis for treatment. Nasal washings and aspirates are unacceptable for Xpert Xpress SARS-CoV-2/FLU/RSV testing.  Fact Sheet for Patients: BloggerCourse.com  Fact Sheet for Healthcare Providers: SeriousBroker.it  This test is not yet approved or cleared by the [place] FDA and has been authorized for detection and/or diagnosis of SARS-CoV-2 by FDA under an Emergency Use Authorization (EUA). This EUA will remain in effect (meaning this test can be used) for the duration of the COVID-19 declaration under Section 564(b)(1) of the Act, 21 U.S.C. section 360bbb-3(b)(1), unless the authorization is terminated or revoked.     Resp Syncytial Virus by PCR NEGATIVE NEGATIVE    Comment: (NOTE) Fact Sheet for  Patients: BloggerCourse.com  Fact Sheet for Healthcare Providers: SeriousBroker.it  This test is not yet approved or cleared by the [place] FDA and has been authorized for detection and/or diagnosis of SARS-CoV-2 by FDA under an Emergency Use Authorization (EUA). This EUA will remain in effect (meaning this test can be used) for the duration of the COVID-19 declaration under Section 564(b)(1) of the Act, 21 U.S.C. section 360bbb-3(b)(1), unless the authorization is terminated or revoked.  Performed at [facility], [address] [place], [place] [address]   I-Stat Lactic Acid, ED     Status: None   Collection Time: 05/03/23  5:26 PM  Result Value Ref Range   Lactic Acid, Venous 1.0 0.5 - 1.9 mmol/L  Troponin I (High Sensitivity)     Status: None   Collection Time: 05/03/23  8:05 PM  Result Value Ref Range   Troponin I (High Sensitivity) 6 <18 ng/L    Comment: (NOTE) Elevated high sensitivity troponin I (hsTnI) values and significant  changes across serial measurements may suggest ACS but many other  chronic and acute conditions are known to elevate hsTnI results.  Refer to the "Links" section for chest pain algorithms and additional  guidance. Performed at [facility], [address], [place], [place] [address]    CT Angio Chest PE W and/or Wo Contrast  Result Date: 05/03/2023 CLINICAL DATA:  High prob PE suspected, flu, heart issues EXAM: CT ANGIOGRAPHY CHEST WITH CONTRAST TECHNIQUE: Multidetector CT imaging of the chest was performed using the standard protocol during bolus administration of intravenous contrast. Multiplanar CT image reconstructions and MIPs were obtained to evaluate the vascular anatomy. RADIATION DOSE REDUCTION: This exam was performed according to the departmental dose-optimization program which includes automated exposure control, adjustment of the mA and/or kV according to  patient size  and/or use of iterative reconstruction technique. CONTRAST:  75mL OMNIPAQUE IOHEXOL 350 MG/ML SOLN COMPARISON:  02/11/2021 FINDINGS: Cardiovascular: SVC patent. Heart size normal. No pericardial effusion. The RV is nondilated. Satisfactory opacification of pulmonary arteries noted, and there is no evidence of pulmonary emboli. There is a separate lingular pulmonary vein, anatomic variant. Adequate contrast opacification of the thoracic aorta with no evidence of dissection, aneurysm, or stenosis. There is classic 3-vessel brachiocephalic arch anatomy without proximal stenosis. Minimal calcified plaque in the descending thoracic aorta Mediastinum/Nodes: No mass, hematoma, or adenopathy. Lungs/Pleura: No pleural effusion. No pneumothorax. Mild dependent atelectasis posteriorly in the lung bases with some linear scarring or subsegmental atelectasis, slightly increased from prior exam. Upper Abdomen: Cholecystectomy clips.  No acute findings. Musculoskeletal: No chest wall abnormality. No acute or significant osseous findings. Review of the MIP images confirms the above findings. IMPRESSION: 1. Negative for acute PE or thoracic aortic dissection. 2. Mild dependent atelectasis in the lung bases. 3. Aortic Atherosclerosis (ICD10-I70.0). Electronically Signed   By: Corlis Leak M.D.   On: 05/03/2023 20:36   DG Chest 2 View  Result Date: 05/03/2023 CLINICAL DATA:  Cough since traveling on plain. EXAM: CHEST - 2 VIEW COMPARISON:  Chest radiographs 10/07/2020 and 03/25/2018 FINDINGS: Cardiac silhouette and mediastinal contours are within limits. Flattening of the diaphragms and mild-to-moderate hyperinflation, similar to prior. Increased lucencies are again seen within the bilateral upper lungs compatible with chronic emphysematous changes. Multiple partially calcified blood vessels on end are similar to prior within the bilateral hilar regions. No acute lung airspace opacity. No definite pleural effusion. No pneumothorax.  Mild-to-moderate multilevel degenerative disc changes of the thoracic spine. IMPRESSION: 1. No acute lung process. 2. Chronic emphysematous changes. Electronically Signed   By: Neita Garnet M.D.   On: 05/03/2023 17:00    Pending Labs Unresulted Labs (From admission, onward)     Start     Ordered   05/03/23 2208  Procalcitonin  Add-on,   AD       References:    Procalcitonin Lower Respiratory Tract Infection AND Sepsis Procalcitonin Algorithm   05/03/23 2207   05/03/23 2207  CK  Add-on,   AD        05/03/23 2206   05/03/23 2207  Magnesium  Add-on,   AD        05/03/23 2206   05/03/23 2207  Phosphorus  Add-on,   AD        05/03/23 2206   05/03/23 2207  Hepatic function panel  Add-on,   AD       Question:  Release to patient  Answer:  Immediate   05/03/23 2206   05/03/23 2207  Blood gas, venous  ONCE - STAT,   STAT       Question:  Release to patient  Answer:  Immediate   05/03/23 2206   05/03/23 2207  TSH  Add-on,   AD        05/03/23 2206   05/03/23 2207  Urinalysis, Complete w Microscopic -Urine, Clean Catch  Once,   URGENT       Question Answer Comment  Release to patient Immediate   Specimen Source Urine, Clean Catch      05/03/23 2206            Vitals/Pain Today's Vitals   05/03/23 2132 05/03/23 2145 05/03/23 2215 05/03/23 2222  BP:  131/60 (!) 121/53   Pulse:  93 89   Resp:  (!) 29 14  Temp:    99 F (37.2 C)  TempSrc:    Oral  SpO2:  97% 96%   Weight: 59 kg     PainSc:        Isolation Precautions No active isolations  Medications Medications  ipratropium-albuterol (DUONEB) 0.5-2.5 (3) MG/3ML nebulizer solution 3 mL (3 mLs Nebulization Given 05/03/23 1746)  iohexol (OMNIPAQUE) 350 MG/ML injection 75 mL (75 mLs Intravenous Contrast Given 05/03/23 2022)  oseltamivir (TAMIFLU) capsule 75 mg (75 mg Oral Given 05/03/23 2223)    Mobility walks     Focused Assessments Pulmonary Assessment Handoff:  Lung sounds:   O2 Device: Room Air       R Recommendations: See Admitting Provider Note  Report given to:   Additional Notes:

## 2023-05-03 NOTE — Assessment & Plan Note (Signed)
Continue Crestor 5 mg a day

## 2023-05-03 NOTE — ED Provider Notes (Signed)
[place] EMERGENCY DEPARTMENT AT [facility] Provider Note   CSN: [id number] Arrival date & time: 05/03/23  1624     History  No chief complaint on file.   Gaylyn Lambert Bohman is a 82 y.o. female.  With a history of CKD, anemia, arthritis, hyperlipidemia, GERD presenting to the ED for evaluation of cough and shortness of breath.  She states she traveled down to [place] 5 days ago.  She then flew back 2 days ago.  She developed some mild chest pressure and a sore throat yesterday.  Yesterday evening she developed a significant cough.  States she has been unable to sleep due to this cough.  Cough is nonproductive.  She reports shortness of breath at rest as well.  Chest pressure continues.  She denies any fevers.  No nausea or vomiting.  No congestion or rhinorrhea.  She reports having all of her vaccines yearly.  HPI     Home Medications Prior to Admission medications   Medication Sig Start Date End Date Taking? Authorizing Provider  butalbital-aspirin-caffeine [facility]) 50-325-40 MG capsule Take 1 capsule by mouth every 4 (four) hours as needed for headache. 03/05/21   Myrlene Broker, MD  clobetasol (TEMOVATE) 0.05 % external solution Apply 1 Application topically as needed.    [provider]  estradiol (CLIMARA - DOSED IN MG/24 HR) 0.1 mg/24hr patch Climara 0.1 mg/24 hr transdermal patch    [provider]  fluocinonide (LIDEX) 0.05 % external solution Apply 1 application topically 2 (two) times daily as needed (for psorasis.).     [provider]  gabapentin (NEURONTIN) 100 MG capsule Take 3 capsules (300 mg total) by mouth at bedtime. Patient taking differently: Take 400 mg by mouth at bedtime. 02/19/19   Dohmeier, Porfirio Mylar, MD  hydroxychloroquine (PLAQUENIL) 200 MG tablet Take 200 mg by mouth daily. 01/11/22   [provider]  metoprolol succinate (TOPROL-XL) 25 MG 24 hr tablet Take 0.5 tablets (12.5 mg total) by mouth daily.  12/24/22   [name], MD  progesterone (PROMETRIUM) 100 MG capsule Take 100 mg by mouth at bedtime.    [provider]  rosuvastatin (CRESTOR) 5 MG tablet Take 1 tablet (5 mg total) by mouth daily. 06/07/22   Myrlene Broker, MD  sulfaSALAzine (AZULFIDINE) 500 MG tablet Take 1,000-1,500 mg by mouth 2 (two) times daily. 1000 mg in the am and 1500 mg at night    [provider]  SYNTHROID 50 MCG tablet Take 1 tablet (50 mcg total) by mouth every morning. 05/18/22   Myrlene Broker, MD  TESTOSTERONE PROPIONATE IJ Place 1 application  onto the skin 3 (three) times a week.    [provider]  VITAMIN D PO Take 1,000 mg by mouth daily.    [provider]      Allergies    Imitrex [sumatriptan], Nsaids, Ciprofloxacin hcl, and Keflex [cephalexin]    Review of Systems   Review of Systems  Respiratory:  Positive for cough, chest tightness and shortness of breath.   All other systems reviewed and are negative.   Physical Exam Updated Vital Signs BP (!) 121/53   Pulse 89   Temp 99 F (37.2 C) (Oral)   Resp 14   Wt 59 kg   SpO2 96%   BMI 21.98 kg/m  Physical Exam Vitals and nursing note reviewed.  Constitutional:      General: She is not in acute distress.    Appearance: She is  well-developed. She is ill-appearing. She is not toxic-appearing or diaphoretic.     Comments: Resting comfortably in bed  HENT:     Head: Normocephalic and atraumatic.  Eyes:     Conjunctiva/sclera: Conjunctivae normal.  Cardiovascular:     Rate and Rhythm: Normal rate and regular rhythm.     Heart sounds: No murmur heard. Pulmonary:     Effort: Pulmonary effort is normal. No respiratory distress.     Breath sounds: Normal breath sounds. No stridor. No wheezing, rhonchi or rales.  Abdominal:     Palpations: Abdomen is soft.     Tenderness: There is no abdominal tenderness. There is no guarding.  Musculoskeletal:        General: No swelling.     Cervical  back: Neck supple.     Right lower leg: No edema.     Left lower leg: No edema.  Skin:    General: Skin is warm and dry.     Capillary Refill: Capillary refill takes less than 2 seconds.  Neurological:     Mental Status: She is alert.  Psychiatric:        Mood and Affect: Mood normal.     ED Results / Procedures / Treatments   Labs (all labs ordered are listed, but only abnormal results are displayed) Labs Reviewed  RESP PANEL BY RT-PCR (RSV, FLU A&B, COVID)  RVPGX2 - Abnormal; Notable for the following components:      Result Value   Influenza A by PCR POSITIVE (*)    All other components within normal limits  COMPREHENSIVE METABOLIC PANEL - Abnormal; Notable for the following components:   Glucose, Bld 107 (*)    Calcium 8.5 (*)    Total Protein 6.3 (*)    Total Bilirubin 1.2 (*)    All other components within normal limits  CBC WITH DIFFERENTIAL/PLATELET - Abnormal; Notable for the following components:   MCV 102.8 (*)    Platelets 106 (*)    Lymphs Abs 0.4 (*)    All other components within normal limits  CK  MAGNESIUM  PHOSPHORUS  HEPATIC FUNCTION PANEL  BLOOD GAS, VENOUS  TSH  URINALYSIS, COMPLETE (UACMP) WITH MICROSCOPIC  PROCALCITONIN  I-STAT CG4 LACTIC ACID, ED  I-STAT CG4 LACTIC ACID, ED  TROPONIN I (HIGH SENSITIVITY)  TROPONIN I (HIGH SENSITIVITY)    EKG EKG Interpretation Date/Time:  Tuesday May 03 2023 17:37:02 EST Ventricular Rate:  95 PR Interval:  122 QRS Duration:  94 QT Interval:  344 QTC Calculation: 432 R Axis:   33  Text Interpretation: Sinus rhythm with occasional Premature ventricular complexes Left ventricular hypertrophy with repolarization abnormality ( Cornell product ) When compared with ECG of 07-Oct-2020 10:13, PREVIOUS ECG IS PRESENT Confirmed by Alvester Chou [phone]) on 05/03/2023 8:31:00 PM  Radiology CT Angio Chest PE W and/or Wo Contrast  Result Date: 05/03/2023 CLINICAL DATA:  High prob PE suspected, flu, heart  issues EXAM: CT ANGIOGRAPHY CHEST WITH CONTRAST TECHNIQUE: Multidetector CT imaging of the chest was performed using the standard protocol during bolus administration of intravenous contrast. Multiplanar CT image reconstructions and MIPs were obtained to evaluate the vascular anatomy. RADIATION DOSE REDUCTION: This exam was performed according to the departmental dose-optimization program which includes automated exposure control, adjustment of the mA and/or kV according to patient size and/or use of iterative reconstruction technique. CONTRAST:  75mL OMNIPAQUE IOHEXOL 350 MG/ML SOLN COMPARISON:  02/11/2021 FINDINGS: Cardiovascular: SVC patent. Heart size normal. No pericardial effusion. The RV is nondilated.  Satisfactory opacification of pulmonary arteries noted, and there is no evidence of pulmonary emboli. There is a separate lingular pulmonary vein, anatomic variant. Adequate contrast opacification of the thoracic aorta with no evidence of dissection, aneurysm, or stenosis. There is classic 3-vessel brachiocephalic arch anatomy without proximal stenosis. Minimal calcified plaque in the descending thoracic aorta Mediastinum/Nodes: No mass, hematoma, or adenopathy. Lungs/Pleura: No pleural effusion. No pneumothorax. Mild dependent atelectasis posteriorly in the lung bases with some linear scarring or subsegmental atelectasis, slightly increased from prior exam. Upper Abdomen: Cholecystectomy clips.  No acute findings. Musculoskeletal: No chest wall abnormality. No acute or significant osseous findings. Review of the MIP images confirms the above findings. IMPRESSION: 1. Negative for acute PE or thoracic aortic dissection. 2. Mild dependent atelectasis in the lung bases. 3. Aortic Atherosclerosis (ICD10-I70.0). Electronically Signed   By: Corlis Leak M.D.   On: 05/03/2023 20:36   DG Chest 2 View  Result Date: 05/03/2023 CLINICAL DATA:  Cough since traveling on [place]. EXAM: CHEST - 2 VIEW COMPARISON:  Chest  radiographs 10/07/2020 and 03/25/2018 FINDINGS: Cardiac silhouette and mediastinal contours are within limits. Flattening of the diaphragms and mild-to-moderate hyperinflation, similar to prior. Increased lucencies are again seen within the bilateral upper lungs compatible with chronic emphysematous changes. Multiple partially calcified blood vessels on end are similar to prior within the bilateral hilar regions. No acute lung airspace opacity. No definite pleural effusion. No pneumothorax. Mild-to-moderate multilevel degenerative disc changes of the thoracic spine. IMPRESSION: 1. No acute lung process. 2. Chronic emphysematous changes. Electronically Signed   By: Neita Garnet M.D.   On: 05/03/2023 17:00    Procedures Procedures    Medications Ordered in ED Medications  ipratropium-albuterol (DUONEB) 0.5-2.5 (3) MG/3ML nebulizer solution 3 mL (3 mLs Nebulization Given 05/03/23 1746)  iohexol (OMNIPAQUE) 350 MG/ML injection 75 mL (75 mLs Intravenous Contrast Given 05/03/23 2022)  oseltamivir (TAMIFLU) capsule 75 mg (75 mg Oral Given 05/03/23 2223)    ED Course/ Medical Decision Making/ A&P                                 Medical Decision Making Amount and/or Complexity of Data Reviewed Labs: ordered. Radiology: ordered.  Risk Prescription drug management. Decision regarding hospitalization.  This patient presents to the ED for concern of cough, shortness of breath, this involves an extensive number of treatment options, and is a complaint that carries with it a high risk of complications and morbidity.  The emergent differential diagnosis for shortness of breath includes, but is not limited to, Pulmonary edema, bronchoconstriction, Pneumonia, Pulmonary embolism, Pneumotherax/ Hemothorax, Dysrythmia, ACS.    My initial workup includes labs, imaging, symptom control fracture  Additional history obtained from: Nursing notes from this visit.  I ordered, reviewed and interpreted labs  which include: CBC, CMP, lactate, troponin, respiratory panel.  Injury at the moment influenza A positive.  No leukocytosis or anemia.  So hyperglycemia 107.  No significant electrolyte derangement or kidney dysfunction.  Initial troponin 7 with delta troponin 6  I ordered imaging studies including chest x-ray, CT PE study I independently visualized and interpreted imaging which showed emphysematous changes on chest x-ray, negative CT PE study I agree with the radiologist interpretation  Cardiac Monitoring:  The patient was maintained on a cardiac monitor.  I personally viewed and interpreted the cardiac monitored which showed an underlying rhythm of: NSR  Consultations Obtained:  I requested consultation with the hospitalist  Dr. Adela Glimpse,  and discussed lab and imaging findings as well as pertinent plan - they recommend: admission  Afebrile, hemodynamically stable.  82 year old female presenting for evaluation cough and shortness of breath.  Symptoms began yesterday morning and got significantly worse yesterday evening.  Difficulty sleeping at night due to the symptoms.  Cough is nonproductive.  EMS found pulse ox of 92 percent on room air.  This appears to be transient as during my entire evaluation she remained above 98% on room air with good waveform.  No adventitious breath sounds.  Lab workup was reassuring but positive for influenza A.  I had a shared decision-making conversation with the patient regarding management.  She states she lives at home alone and has no one to take care of her or monitor her in the event that symptoms worsen.  Will admit to hospitalist for continued monitoring.  Will initiate Tamiflu as symptom onset was less than 48 hours prior to arrival.  Stable at admission.  Patient's case discussed with Dr. Renaye Rakers who agrees with plan to admit.   Note: Portions of this report may have been transcribed using voice recognition software. Every effort was made to ensure accuracy;  however, inadvertent computerized transcription errors may still be present.         Final Clinical Impression(s) / ED Diagnoses Final diagnoses:  Influenza A    Rx / DC Orders ED Discharge Orders     None         Michelle Piper, Cordelia Poche 05/03/23 2233    Terald Sleeper, MD 05/04/23 [phone]

## 2023-05-03 NOTE — ED Triage Notes (Signed)
Patient arrived by [facility] with ongoing dry cough since traveling on plane. Complains of weakness and concerned for pneumonia. Using otc meds with no improvement. Sat 92% per ems and placed on 2l. Alert and oriented No cp/ no sob

## 2023-05-03 NOTE — ED Provider Triage Note (Signed)
Emergency Medicine Provider Triage Evaluation Note  [name] , a 82 y.o. female  was evaluated in triage.  Pt complains of cough, chest discomfort with coughing, shortness of breath.  Review of Systems  Positive: Chest pain, shortness of breath, Negative: Leg swelling, hemoptysis  Physical Exam  BP (!) 135/58 (BP Location: Left Arm)   Pulse 88   Temp 99.5 F (37.5 C) (Oral)   Resp 18   SpO2 96%  Gen:   Awake, no distress, mild conversational dyspnea noted Resp:  Normal effort, diminished at bilateral lung bases MSK:   Moves extremities without difficulty    Medical Decision Making  Medically screening exam initiated at 5:17 PM.  Appropriate orders placed.  Cheryl Lambert Lowenthal was informed that the remainder of the evaluation will be completed by another provider, this initial triage assessment does not replace that evaluation, and the importance of remaining in the ED until their evaluation is complete.  The patient reports with cough, chest discomfort, and shortness of breath.  Will obtain basic labs Wels and EKG, chest x-ray, troponin to evaluate for ACS, pulmonary edema, pulmonary infiltrates, or pneumothorax.  Chest x-ray does not show any infiltrates but does show emphysema.  Patient denies any history of COPD or asthma.  She does report chest pain with coughing and does have recent travel with flight to [place].  Will obtain CT angiogram to evaluate for pulmonary embolism as well as occult infiltrate.  DuoNeb ordered.   Durwin Glaze, MD 05/03/23 [phone]

## 2023-05-03 NOTE — Assessment & Plan Note (Signed)
Supportive management Reglan as needed dose of Benadryl For severe pain Fentanyl Norco Given severe headache obtain CT head for completion No meningismus neck supple

## 2023-05-03 NOTE — Assessment & Plan Note (Signed)
Continue Tamiflu 75 mg daily Ra hydrate Supportive management

## 2023-05-03 NOTE — Subjective & Objective (Signed)
Dry cough, weakness O2 sat 92% and started on2 L No Cp/SOB

## 2023-05-03 NOTE — H&P (Signed)
[name]:[id number] DOB: Sep 26, 1940 DOA: 05/03/2023     PCP: Myrlene Broker, MD   Outpatient Specialists:      GI Dr.Cirigliano,    (  LB)    Patient arrived to ER on 05/03/23 at 1624 Referred by Attending Trifan, Kermit Balo, MD   Patient coming from:    home Lives alone,      Chief Complaint: cough   HPI: Cheryl Lambert Shifrin is a 82 y.o. female with medical history significant of psoriatic arthritis hypothyroidism, status post cholecystectomy, migraine,, GERD, hyperlipidemia, fibromyalgia, PVCs  Presented with   cough fatigue had a recent travel Reports fevers severe headache Nausea vomiting Feels very poorly General Fatigue Dry cough, weakness O2 sat 92% and started on2 L No Cp/SOB Initial blood pressure in the 70s but quickly improved Denies significant ETOH intake   Does not smoke   Lab Results  Component Value Date   SARSCOV2NAA NEGATIVE 05/03/2023      Regarding pertinent Chronic problems:       HTN on metoprolol  last echo  Recent Results (from the past 81191 hour(s))  ECHOCARDIOGRAM COMPLETE   Collection Time: 11/13/20  1:45 PM  Result Value   Area-P 1/2 4.36   S' Lateral 3.50   Narrative      ECHOCARDIOGRAM REPORT     IMPRESSIONS    1. Left ventricular ejection fraction, by estimation, is 55 to 60%. Left ventricular ejection fraction by 3D volume is 58 %. The left ventricle has normal function. The left ventricle has no regional wall motion abnormalities. Left ventricular diastolic  parameters were normal. The average left ventricular global longitudinal strain is -20.6 %. The global longitudinal strain is normal.  2. Right ventricular systolic function is normal. The right ventricular size is normal. There is normal pulmonary artery systolic pressure.  3. The mitral valve is grossly normal. Trivial mitral valve regurgitation. No evidence of mitral stenosis.  4. The aortic valve is tricuspid. There is mild calcification of  the aortic valve. Aortic valve regurgitation is not visualized. Mild aortic valve sclerosis is present, with no evidence of aortic valve stenosis.  5. The inferior vena cava is normal in size with greater than 50% respiratory variability, suggesting right atrial pressure of 3 mmHg.  Comparison(s): No significant change from prior study.  Conclusion(s)/Recommendation(s): Normal biventricular function without evidence of hemodynamically significant valvular heart disease.            Hypothyroidism:   Lab Results  Component Value Date   TSH 3.37 03/18/2022   on synthroid Psoriatic arthritis on Plaquenil and sulfaSALAzine  While in ER:   Initially placed on 2 L of oxygen now improved.  Blood pressures initially soft up to 120s systolic    Lab Orders         Resp panel by RT-PCR (RSV, Flu A&B, Covid) Anterior Nasal Swab         Comprehensive metabolic panel         CBC with Differential         I-Stat Lactic Acid, ED      CT HEAD ordered     CXR -  NON acute    CTA chest -  nonacute, no PE, no evidence of infiltrate  Following Medications were ordered in ER: Medications  oseltamivir (TAMIFLU) capsule 75 mg (has no administration in time range)  ipratropium-albuterol (DUONEB) 0.5-2.5 (3) MG/3ML nebulizer solution 3 mL (3 mLs Nebulization Given 05/03/23 1746)  iohexol (OMNIPAQUE) 350  MG/ML injection 75 mL (75 mLs Intravenous Contrast Given 05/03/23 2022)        ED Triage Vitals  Encounter Vitals Group     BP 05/03/23 1626 (!) 135/58     Systolic BP Percentile --      Diastolic BP Percentile --      Pulse Rate 05/03/23 1626 88     Resp 05/03/23 1626 18     Temp 05/03/23 1626 99.5 F (37.5 C)     Temp Source 05/03/23 1626 Oral     SpO2 05/03/23 1626 96 %     Weight 05/03/23 2132 130 lb 1.1 oz (59 kg)     Height --      Head Circumference --      Peak Flow --      Pain Score 05/03/23 1629 4     Pain Loc --      Pain Education --      Exclude from Growth Chart --    NWGN(56)@     _________________________________________ Significant initial  Findings: Abnormal Labs Reviewed  RESP PANEL BY RT-PCR (RSV, FLU A&B, COVID)  RVPGX2 - Abnormal; Notable for the following components:      Result Value   Influenza A by PCR POSITIVE (*)    All other components within normal limits  COMPREHENSIVE METABOLIC PANEL - Abnormal; Notable for the following components:   Glucose, Bld 107 (*)    Calcium 8.5 (*)    Total Protein 6.3 (*)    Total Bilirubin 1.2 (*)    All other components within normal limits  CBC WITH DIFFERENTIAL/PLATELET - Abnormal; Notable for the following components:   MCV 102.8 (*)    Platelets 106 (*)    Lymphs Abs 0.4 (*)    All other components within normal limits      _________________________ Troponin   Cardiac Panel (last 3 results) Recent Labs    05/03/23 1641 05/03/23 2005  TROPONINIHS 7 6     ECG: Ordered Personally reviewed and interpreted by me showing: HR : 95 Rhythm: Sinus rhythm with occasional Premature ventricular complexes Left ventricular hypertrophy with repolarization abnormality   When compared with ECG of 07-Oct-2020 10:1 QTC 432   COVID-19 Labs  No results for input(s): "DDIMER", "FERRITIN", "LDH", "CRP" in the last 72 hours.  Lab Results  Component Value Date   SARSCOV2NAA NEGATIVE 05/03/2023    The recent clinical data is shown below. Vitals:   05/03/23 2005 05/03/23 2043 05/03/23 2115 05/03/23 2132  BP: 129/66     Pulse: 91     Resp: 17     Temp:  99.5 F (37.5 C) 100.2 F (37.9 C)   TempSrc:  Oral Oral   SpO2: 98%     Weight:    59 kg    WBC     Component Value Date/Time   WBC 6.3 05/03/2023 1641   LYMPHSABS 0.4 (L) 05/03/2023 1641   MONOABS 0.4 05/03/2023 1641   EOSABS 0.0 05/03/2023 1641   BASOSABS 0.0 05/03/2023 1641    Lactic Acid, Venous    Component Value Date/Time   LATICACIDVEN 1.0 05/03/2023 1726    Procalcitonin   Ordered      UA  ordered    Results for orders  placed or performed during the hospital encounter of 05/03/23  Resp panel by RT-PCR (RSV, Flu A&B, Covid) Anterior Nasal Swab     Status: Abnormal   Collection Time: 05/03/23  5:00 PM   Specimen: Anterior Nasal  Swab  Result Value Ref Range Status   SARS Coronavirus 2 by RT PCR NEGATIVE NEGATIVE Final   Influenza A by PCR POSITIVE (A) NEGATIVE Final   Influenza B by PCR NEGATIVE NEGATIVE Final         Resp Syncytial Virus by PCR NEGATIVE NEGATIVE Final           __________________________________________________________ Recent Labs  Lab 05/03/23 1641  NA 138  K 3.5  CO2 23  GLUCOSE 107*  BUN 8  CREATININE 0.74  CALCIUM 8.5*    Cr   stable,   Lab Results  Component Value Date   CREATININE 0.74 05/03/2023   CREATININE 0.68 03/05/2021   CREATININE 0.61 10/07/2020    Recent Labs  Lab 05/03/23 1641  AST 21  ALT 14  ALKPHOS 51  BILITOT 1.2*  PROT 6.3*  ALBUMIN 3.7   Lab Results  Component Value Date   CALCIUM 8.5 (L) 05/03/2023   PHOS 2.6 05/03/2023    Plt: Lab Results  Component Value Date   PLT 106 (L) 05/03/2023    Recent Labs  Lab 05/03/23 1641  WBC 6.3  NEUTROABS 5.5  HGB 12.8  HCT 40.1  MCV 102.8*  PLT 106*    HG/HCT  stable      Component Value Date/Time   HGB 12.8 05/03/2023 1641   HCT 40.1 05/03/2023 1641   MCV 102.8 (H) 05/03/2023 1641     _______________________________________________ Hospitalist was called for admission for   Influenza A dehydration severe headache   The following Work up has been ordered so far:  Orders Placed This Encounter  Procedures   Resp panel by RT-PCR (RSV, Flu A&B, Covid) Anterior Nasal Swab   DG Chest 2 View   CT Angio Chest PE W and/or Wo Contrast   Comprehensive metabolic panel   CBC with Differential   ED Cardiac monitoring   Consult to hospitalist  Flu [phone]   I-Stat Lactic Acid, ED   ED EKG     OTHER Significant initial  Findings:  labs showing:         Cultures:    Component  Value Date/Time   SDES  09/22/2017 1555    BLOOD RIGHT FOREARM Performed at [facility], [address], [place], [place] [address]    SPECREQUEST  09/22/2017 1555    BOTTLES DRAWN AEROBIC AND ANAEROBIC Blood Culture adequate volume Performed at [facility], [address] [place], [place] [address]    CULT  09/22/2017 1555    NO GROWTH 5 DAYS Performed at [facility], [address] [place], [place] [address]    REPTSTATUS 09/27/2017 FINAL 09/22/2017 1555     Radiological Exams on Admission: CT Angio Chest PE W and/or Wo Contrast  Result Date: 05/03/2023 CLINICAL DATA:  High prob PE suspected, flu, heart issues EXAM: CT ANGIOGRAPHY CHEST WITH CONTRAST TECHNIQUE: Multidetector CT imaging of the chest was performed using the standard protocol during bolus administration of intravenous contrast. Multiplanar CT image reconstructions and MIPs were obtained to evaluate the vascular anatomy. RADIATION DOSE REDUCTION: This exam was performed according to the departmental dose-optimization program which includes automated exposure control, adjustment of the mA and/or kV according to patient size and/or use of iterative reconstruction technique. CONTRAST:  75mL OMNIPAQUE IOHEXOL 350 MG/ML SOLN COMPARISON:  02/11/2021 FINDINGS: Cardiovascular: SVC patent. Heart size normal. No pericardial effusion. The RV is nondilated. Satisfactory opacification of pulmonary arteries noted, and there is no evidence of  pulmonary emboli. There is a separate lingular pulmonary vein, anatomic variant. Adequate contrast opacification of the thoracic aorta with no evidence of dissection, aneurysm, or stenosis. There is classic 3-vessel brachiocephalic arch anatomy without proximal stenosis. Minimal calcified plaque in the descending thoracic aorta Mediastinum/Nodes: No mass, hematoma, or adenopathy. Lungs/Pleura: No pleural effusion. No pneumothorax. Mild dependent atelectasis  posteriorly in the lung bases with some linear scarring or subsegmental atelectasis, slightly increased from prior exam. Upper Abdomen: Cholecystectomy clips.  No acute findings. Musculoskeletal: No chest wall abnormality. No acute or significant osseous findings. Review of the MIP images confirms the above findings. IMPRESSION: 1. Negative for acute PE or thoracic aortic dissection. 2. Mild dependent atelectasis in the lung bases. 3. Aortic Atherosclerosis (ICD10-I70.0). Electronically Signed   By: Corlis Leak M.D.   On: 05/03/2023 20:36   DG Chest 2 View  Result Date: 05/03/2023 CLINICAL DATA:  Cough since traveling on plain. EXAM: CHEST - 2 VIEW COMPARISON:  Chest radiographs 10/07/2020 and 03/25/2018 FINDINGS: Cardiac silhouette and mediastinal contours are within limits. Flattening of the diaphragms and mild-to-moderate hyperinflation, similar to prior. Increased lucencies are again seen within the bilateral upper lungs compatible with chronic emphysematous changes. Multiple partially calcified blood vessels on end are similar to prior within the bilateral hilar regions. No acute lung airspace opacity. No definite pleural effusion. No pneumothorax. Mild-to-moderate multilevel degenerative disc changes of the thoracic spine. IMPRESSION: 1. No acute lung process. 2. Chronic emphysematous changes. Electronically Signed   By: Neita Garnet M.D.   On: 05/03/2023 17:00   _______________________________________________________________________________________________________ Latest  Blood pressure 129/66, pulse 91, temperature 100.2 F (37.9 C), temperature source Oral, resp. rate 17, weight 59 kg, SpO2 98%.   Vitals  labs and radiology finding personally reviewed  Review of Systems:    Pertinent positives include:  Fevers, chills, fatigue shortness of breath at rest non-productive cough,  Constitutional:  No weight loss, night sweats, , weight loss  HEENT:  No headaches, Difficulty  swallowing,Tooth/dental problems,Sore throat,  No sneezing, itching, ear ache, nasal congestion, post nasal drip,  Cardio-vascular:  No chest pain, Orthopnea, PND, anasarca, dizziness, palpitations.no Bilateral lower extremity swelling  GI:  No heartburn, indigestion, abdominal pain, nausea, vomiting, diarrhea, change in bowel habits, loss of appetite, melena, blood in stool, hematemesis Resp:   No dyspnea on exertion, No excess mucus, no productive cough, No No coughing up of blood.No change in color of mucus.No wheezing. Skin:  no rash or lesions. No jaundice GU:  no dysuria, change in color of urine, no urgency or frequency. No straining to urinate.  No flank pain.  Musculoskeletal:  No joint pain or no joint swelling. No decreased range of motion. No back pain.  Psych:  No change in mood or affect. No depression or anxiety. No memory loss.  Neuro: no localizing neurological complaints, no tingling, no weakness, no double vision, no gait abnormality, no slurred speech, no confusion  All systems reviewed and apart from HOPI all are negative _______________________________________________________________________________________________ Past Medical History:   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HCC)     dr Vincente Poli, coricare B now resolved   Anemia    iron   Arthritis    psoriatic  in hips oa in hands   Blood transfusion without reported diagnosis 1978   Cataract    chronic headaches    histamine migraines, rarely occur   Chronic kidney disease    hx bladder infections yrs ago   Colon polyp  yrs ago   Dysrhythmia 1996 to 1999   wore holter monitor result of accidental fall, occasional irregular pulse   Family history of adverse reaction to anesthesia    father had aspiration after anesthesia one time   Gastric polyps    GERD (gastroesophageal reflux disease)    History of migraine headaches    Hyperlipidemia    Hypothyroidism     Neuromuscular disease (HCC)    fibromyalgia   Pneumonia 2005aspiration pneumonia   stopped when got pneumonia shot, also 2015 regular pneumonia   PONV (postoperative nausea and vomiting)    patient wishes to have no anesthetics that could affect memory due to past memory issues, no cause found   Psoriatic arthritis (HCC)    PVC (premature ventricular contraction)     Past Surgical History:  Procedure Laterality Date   ABDOMINAL HYSTERECTOMY     left ovary left and tube left   APPENDECTOMY     with hysterectomy   CARDIAC CATHETERIZATION     CATARACT EXTRACTION, BILATERAL     CHOLECYSTECTOMY     COLONOSCOPY     FINGER SURGERY     middle finger right hand   INCONTINENCE SURGERY     bladder sling   RECTOCELE REPAIR     RHINOPLASTY     SHOULDER ARTHROSCOPY WITH ROTATOR CUFF REPAIR AND SUBACROMIAL DECOMPRESSION Right 06/17/2015   Procedure: SHOULDER DIAGNOSTIC OPERATIVE ARTHROSCOPY WITH MINI-OPEN ROTATOR CUFF REPAIR AND SUBACROMIAL DECOMPRESSION;  Surgeon: Cammy Copa, MD;  Location: [facility] OR;  Service: Orthopedics;  Laterality: Right;   TONSILLECTOMY  age 62   adenoids also   TOTAL HIP ARTHROPLASTY Right 07/06/2017   Procedure: RIGHT TOTAL HIP ARTHROPLASTY ANTERIOR APPROACH;  Surgeon: Ollen Gross, MD;  Location: [facility] ORS;  Service: Orthopedics;  Laterality: Right;    Social History:  Ambulatory   independently     reports that she has never smoked. She has never used smokeless tobacco. She reports current alcohol use. She reports that she does not use drugs.   Family History:   Family History  Problem Relation Age of Onset   Esophageal cancer Father    Stomach cancer Father    Hypertension Father    Colon cancer Mother    Breast cancer Mother    Depression Mother    Anxiety disorder Mother    Heart attack Paternal Grandfather    Diabetes Paternal Grandmother    Liver cancer Maternal Grandmother    Rectal cancer Neg Hx     ______________________________________________________________________________________________ Allergies: Allergies  Allergen Reactions   Imitrex [Sumatriptan] Nausea And Vomiting and Hypertension   Nsaids Other (See Comments)    Gerd as result taking.   Ciprofloxacin Hcl    Keflex [Cephalexin] Nausea And Vomiting     Prior to Admission medications   Medication Sig Start Date End Date Taking? Authorizing Provider  butalbital-aspirin-caffeine [facility]) 50-325-40 MG capsule Take 1 capsule by mouth every 4 (four) hours as needed for headache. 03/05/21   Myrlene Broker, MD  clobetasol (TEMOVATE) 0.05 % external solution Apply 1 Application topically as needed.    [provider]  estradiol (CLIMARA - DOSED IN MG/24 HR) 0.1 mg/24hr patch Climara 0.1 mg/24 hr transdermal patch    [provider]  fluocinonide (LIDEX) 0.05 % external solution Apply 1 application topically 2 (two) times daily as needed (for psorasis.).     [provider]  gabapentin (NEURONTIN) 100 MG capsule Take 3 capsules (300 mg total) by mouth at bedtime. Patient  taking differently: Take 400 mg by mouth at bedtime. 02/19/19   Dohmeier, Porfirio Mylar, MD  hydroxychloroquine (PLAQUENIL) 200 MG tablet Take 200 mg by mouth daily. 01/11/22   [provider]  metoprolol succinate (TOPROL-XL) 25 MG 24 hr tablet Take 0.5 tablets (12.5 mg total) by mouth daily. 12/24/22   [name], MD  progesterone (PROMETRIUM) 100 MG capsule Take 100 mg by mouth at bedtime.    [provider]  rosuvastatin (CRESTOR) 5 MG tablet Take 1 tablet (5 mg total) by mouth daily. 06/07/22   Myrlene Broker, MD  sulfaSALAzine (AZULFIDINE) 500 MG tablet Take 1,000-1,500 mg by mouth 2 (two) times daily. 1000 mg in the am and 1500 mg at night    [provider]  SYNTHROID 50 MCG tablet Take 1 tablet (50 mcg total) by mouth every morning. 05/18/22   Myrlene Broker, MD  TESTOSTERONE  PROPIONATE IJ Place 1 application  onto the skin 3 (three) times a week.    [provider]  VITAMIN D PO Take 1,000 mg by mouth daily.    [provider]    ___________________________________________________________________________________________________ Physical Exam:    05/03/2023    9:32 PM 05/03/2023    8:05 PM 05/03/2023    4:26 PM  Vitals with BMI  Weight 130 lbs 1 oz    Systolic  129 135  Diastolic  66 58  Pulse  91 88     1. General:  in No  Acute distress   ill-appearing 2. Psychological: Alert and   Oriented 3. Head/ENT:    Dry Mucous Membranes                          Head Non traumatic, neck supple                         Poor Dentition 4. SKIN: decreased Skin turgor,  Skin clean Dry and intact no rash    5. Heart: Regular rate and rhythm no  Murmur, no Rub or gallop 6. Lungs , no wheezes or crackles   7. Abdomen: Soft,  non-tender, Non distended  bowel sounds present 8. Lower extremities: no clubbing, cyanosis, no  edema 9. Neurologically Grossly intact, moving all 4 extremities equally  10. MSK: Normal range of motion    Chart has been reviewed  ______________________________________________________________________________________________  Assessment/Plan  82 y.o. female with medical history significant of psoriatic arthritis hypothyroidism, status post cholecystectomy, migraine,, GERD, hyperlipidemia, fibromyalgia, PVCs  Admitted for   Influenza A dehydration severe headache    Present on Admission:  Influenza A  Migraine  Psoriatic arthritis (HCC)  PVC's (premature ventricular contractions)  Hyperlipidemia  Essential hypertension  Dehydration      Influenza A Continue Tamiflu 75 mg daily Ra hydrate Supportive management  Migraine Supportive management Reglan as needed dose of Benadryl For severe pain Fentanyl Norco Given severe headache obtain CT head for completion No meningismus neck supple  Psoriatic arthritis  (HCC) Hold Plaquenil  PVC's (premature ventricular contractions) Chronic stable resume Toprol when able to tolerate tonight have had low blood pressures  Hyperlipidemia Continue Crestor 5 mg a day  Essential hypertension Given soft blood pressures will allow permissive hypertension for tonight  Dehydration Will rehydrate and follow fluid status   Other plan as per orders.  DVT prophylaxis:  SCD      Code Status:    Code Status: Prior FULL CODE  as  per patient   I had personally discussed CODE STATUS with patient   ACP   none  Family Communication:   Family not at  Bedside    Diet regular   Disposition Plan:    To home once workup is complete and patient is stable   Following barriers for discharge:                                                      Electrolytes corrected                                                          Pain controlled with PO medications                               Afebrile,                               Will need to be able to tolerate PO                                    Consult Orders  (From admission, onward)           Start     Ordered   05/03/23 2136  Consult to hospitalist  Flu [phone]  Once       Comments: Flu  [phone]  Provider:  (Not yet assigned)  Question Answer Comment  Place call to: [facility]   Reason for Consult Admit      05/03/23 2136               Consults called: none   Admission status:  ED Disposition     ED Disposition  Admit   Condition  --   Comment  Hospital Area: [facility] [[id number]]  Level of Care: Telemetry Medical [[id number]]  May place patient in observation at [facility] or [facility] if equivalent level of care is available:: No  Covid Evaluation: Asymptomatic - no recent exposure (last 10 days) testing not required  Diagnosis: Influenza A [956387]  Admitting Physician: Therisa Doyne [[id number]]  Attending Physician: Therisa Doyne [[id number]]           Obs     Level of care     tele  For 24H      Lab Results  Component Value Date   SARSCOV2NAA NEGATIVE 05/03/2023     Precautions: admitted as   Covid Negative     Vicke Plotner 05/03/2023, 11:55 PM    [facility]     after 2 AM please page floor coverage PA If 7AM-7PM, please contact the day team taking care of the patient using [email or web address]

## 2023-05-03 NOTE — Assessment & Plan Note (Signed)
Chronic stable resume Toprol when able to tolerate tonight have had low blood pressures

## 2023-05-03 NOTE — Assessment & Plan Note (Signed)
Given soft blood pressures will allow permissive hypertension for tonight

## 2023-05-03 NOTE — Assessment & Plan Note (Signed)
Hold Plaquenil °

## 2023-05-04 ENCOUNTER — Observation Stay (HOSPITAL_COMMUNITY): Payer: Medicare PPO

## 2023-05-04 DIAGNOSIS — J101 Influenza due to other identified influenza virus with other respiratory manifestations: Secondary | ICD-10-CM | POA: Diagnosis not present

## 2023-05-04 DIAGNOSIS — R519 Headache, unspecified: Secondary | ICD-10-CM | POA: Diagnosis not present

## 2023-05-04 DIAGNOSIS — I6782 Cerebral ischemia: Secondary | ICD-10-CM | POA: Diagnosis not present

## 2023-05-04 DIAGNOSIS — I672 Cerebral atherosclerosis: Secondary | ICD-10-CM | POA: Diagnosis not present

## 2023-05-04 LAB — CBC
HCT: 36.1 % (ref 36.0–46.0)
Hemoglobin: 11.6 g/dL — ABNORMAL LOW (ref 12.0–15.0)
MCH: 32.8 pg (ref 26.0–34.0)
MCHC: 32.1 g/dL (ref 30.0–36.0)
MCV: 102 fL — ABNORMAL HIGH (ref 80.0–100.0)
Platelets: 102 10*3/uL — ABNORMAL LOW (ref 150–400)
RBC: 3.54 MIL/uL — ABNORMAL LOW (ref 3.87–5.11)
RDW: 13.2 % (ref 11.5–15.5)
WBC: 4.8 10*3/uL (ref 4.0–10.5)
nRBC: 0 % (ref 0.0–0.2)

## 2023-05-04 LAB — URINALYSIS, COMPLETE (UACMP) WITH MICROSCOPIC
Bacteria, UA: NONE SEEN
Bilirubin Urine: NEGATIVE
Glucose, UA: NEGATIVE mg/dL
Hgb urine dipstick: NEGATIVE
Ketones, ur: 20 mg/dL — AB
Leukocytes,Ua: NEGATIVE
Nitrite: NEGATIVE
Protein, ur: NEGATIVE mg/dL
Specific Gravity, Urine: 1.026 (ref 1.005–1.030)
pH: 5 (ref 5.0–8.0)

## 2023-05-04 LAB — COMPREHENSIVE METABOLIC PANEL
ALT: 13 U/L (ref 0–44)
AST: 18 U/L (ref 15–41)
Albumin: 3.1 g/dL — ABNORMAL LOW (ref 3.5–5.0)
Alkaline Phosphatase: 42 U/L (ref 38–126)
Anion gap: 6 (ref 5–15)
BUN: 5 mg/dL — ABNORMAL LOW (ref 8–23)
CO2: 20 mmol/L — ABNORMAL LOW (ref 22–32)
Calcium: 7.7 mg/dL — ABNORMAL LOW (ref 8.9–10.3)
Chloride: 110 mmol/L (ref 98–111)
Creatinine, Ser: 0.57 mg/dL (ref 0.44–1.00)
GFR, Estimated: >60 mL/min (ref >60)
Glucose, Bld: 91 mg/dL (ref 70–99)
Potassium: 3.3 mmol/L — ABNORMAL LOW (ref 3.5–5.1)
Sodium: 136 mmol/L (ref 135–145)
Total Bilirubin: 1.2 mg/dL — ABNORMAL HIGH (ref <1.2)
Total Protein: 5.3 g/dL — ABNORMAL LOW (ref 6.5–8.1)

## 2023-05-04 LAB — PHOSPHORUS: Phosphorus: 3.5 mg/dL (ref 2.5–4.6)

## 2023-05-04 LAB — MAGNESIUM: Magnesium: 2 mg/dL (ref 1.7–2.4)

## 2023-05-04 MED ORDER — METOPROLOL SUCCINATE ER 25 MG PO TB24
12.5000 mg | ORAL_TABLET | Freq: Every day | ORAL | Status: DC
  Administered 2023-05-04 – 2023-05-07 (×3): 12.5 mg via ORAL
  Filled 2023-05-04 (×4): qty 1

## 2023-05-04 MED ORDER — BUTALBITAL-APAP-CAFFEINE 50-325-40 MG PO TABS
1.0000 | ORAL_TABLET | Freq: Four times a day (QID) | ORAL | Status: DC | PRN
  Administered 2023-05-04 (×2): 1 via ORAL
  Filled 2023-05-04 (×2): qty 1

## 2023-05-04 NOTE — Progress Notes (Signed)
Chronic headache due to migraine: Patient is complaining about headache.  Per chart review she has history of migraine as needed medication on board for the management of migraine - CT head unremarkable finding for any acute intracranial abnormality.

## 2023-05-04 NOTE — Progress Notes (Signed)
PROGRESS NOTE    [name]  ION:[id number] DOB: 11-21-1940 DOA: 05/03/2023 PCP: Myrlene Broker, MD  82/F with history of psoriatic arthritis, fibromyalgia, migraine, dyslipidemia, GERD, PVCs presented to the ED last night with cough fatigue fevers headache nausea vomiting.  In the ED low-grade temps, basic labs unremarkable, positive for influenza A   Subjective: -Feels a little better, complains of headache  Assessment and Plan:  Influenza A -Continue Tamiflu day 2/5, supportive care, cut down IV fluids   H/o Migraine Headaches somewhat worse in the setting of above -Supportive care, add Fioricet   Psoriatic arthritis (HCC) Hold Plaquenil   PVCs Essential hypertension -Restart Toprol   Hyperlipidemia Continue Crestor 5 mg a day    DVT prophylaxis:  SCD   Code Status: Full code Family Communication: None present Disposition Plan: Home tomorrow if stable  Consultants:    Procedures:   Antimicrobials:    Objective: Vitals:   05/03/23 2313 05/04/23 0058 05/04/23 0447 05/04/23 0755  BP: 137/66  (!) 111/47 (!) 104/47  Pulse: 85  66 71  Resp: 18  18 17   Temp: 100 F (37.8 C)  99.3 F (37.4 C) 97.9 F (36.6 C)  TempSrc: Oral  Oral Oral  SpO2: 97%  95% 91%  Weight:      Height:  5' 5.5" (1.664 m)      Intake/Output Summary (Last 24 hours) at 05/04/2023 1036 Last data filed at 05/04/2023 2440 Gross per 24 hour  Intake 251.67 ml  Output 1 ml  Net 250.67 ml   Filed Weights   05/03/23 2132  Weight: 59 kg    Examination:  General exam: Ill-appearing, AAOx3 Respiratory system: Rare basilar Rales Cardiovascular system: S1 & S2 heard, RRR.  Abd: nondistended, soft and nontender.Normal bowel sounds heard. Central nervous system: Alert and oriented. No focal neurological deficits.  Moves all extremities, no localizing signs Extremities: no edema Skin: No rashes Psychiatry:  Mood & affect appropriate.     Data Reviewed:    CBC: Recent Labs  Lab 05/03/23 1641 05/04/23 0628  WBC 6.3 4.8  NEUTROABS 5.5  --   HGB 12.8 11.6*  HCT 40.1 36.1  MCV 102.8* 102.0*  PLT 106* 102*   Basic Metabolic Panel: Recent Labs  Lab 05/03/23 1641 05/04/23 0628  NA 138 136  K 3.5 3.3*  CL 106 110  CO2 23 20*  GLUCOSE 107* 91  BUN 8 5*  CREATININE 0.74 0.57  CALCIUM 8.5* 7.7*  MG 1.8 2.0  PHOS 2.6 3.5   GFR: Estimated Creatinine Clearance: 51.5 mL/min (by C-G formula based on SCr of 0.57 mg/dL). Liver Function Tests: Recent Labs  Lab 05/03/23 1641 05/04/23 0628  AST 21  21 18   ALT 15  14 13   ALKPHOS 50  51 42  BILITOT 1.3*  1.2* 1.2*  PROT 6.2*  6.3* 5.3*  ALBUMIN 3.6  3.7 3.1*   No results for input(s): "LIPASE", "AMYLASE" in the last 168 hours. No results for input(s): "AMMONIA" in the last 168 hours. Coagulation Profile: No results for input(s): "INR", "PROTIME" in the last 168 hours. Cardiac Enzymes: Recent Labs  Lab 05/03/23 1641  CKTOTAL 47   BNP (last 3 results) No results for input(s): "PROBNP" in the last 8760 hours. HbA1C: No results for input(s): "HGBA1C" in the last 72 hours. CBG: No results for input(s): "GLUCAP" in the last 168 hours. Lipid Profile: No results for input(s): "CHOL", "HDL", "LDLCALC", "TRIG", "CHOLHDL", "LDLDIRECT" in the last 72 hours. Thyroid  Function Tests: Recent Labs    05/03/23 1641  TSH 0.698   Anemia Panel: No results for input(s): "VITAMINB12", "FOLATE", "FERRITIN", "TIBC", "IRON", "RETICCTPCT" in the last 72 hours. Urine analysis:    Component Value Date/Time   COLORURINE YELLOW 05/04/2023 0548   APPEARANCEUR CLEAR 05/04/2023 0548   LABSPEC 1.026 05/04/2023 0548   PHURINE 5.0 05/04/2023 0548   GLUCOSEU NEGATIVE 05/04/2023 0548   HGBUR NEGATIVE 05/04/2023 0548   BILIRUBINUR NEGATIVE 05/04/2023 0548   BILIRUBINUR negative 05/11/2022 1615   KETONESUR 20 (A) 05/04/2023 0548   PROTEINUR NEGATIVE 05/04/2023 0548   UROBILINOGEN negative  (A) 05/11/2022 1615   UROBILINOGEN 0.2 07/18/2012 1515   NITRITE NEGATIVE 05/04/2023 0548   LEUKOCYTESUR NEGATIVE 05/04/2023 0548   Sepsis Labs: @LABRCNTIP (procalcitonin:4,lacticidven:4)  ) Recent Results (from the past 240 hour(s))  Resp panel by RT-PCR (RSV, Flu A&B, Covid) Anterior Nasal Swab     Status: Abnormal   Collection Time: 05/03/23  5:00 PM   Specimen: Anterior Nasal Swab  Result Value Ref Range Status   SARS Coronavirus 2 by RT PCR NEGATIVE NEGATIVE Final   Influenza A by PCR POSITIVE (A) NEGATIVE Final   Influenza B by PCR NEGATIVE NEGATIVE Final    Comment: (NOTE) The Xpert Xpress SARS-CoV-2/FLU/RSV plus assay is intended as an aid in the diagnosis of influenza from Nasopharyngeal swab specimens and should not be used as a sole basis for treatment. Nasal washings and aspirates are unacceptable for Xpert Xpress SARS-CoV-2/FLU/RSV testing.  Fact Sheet for Patients: BloggerCourse.com  Fact Sheet for Healthcare Providers: SeriousBroker.it  This test is not yet approved or cleared by the [place] FDA and has been authorized for detection and/or diagnosis of SARS-CoV-2 by FDA under an Emergency Use Authorization (EUA). This EUA will remain in effect (meaning this test can be used) for the duration of the COVID-19 declaration under Section 564(b)(1) of the Act, 21 U.S.C. section 360bbb-3(b)(1), unless the authorization is terminated or revoked.     Resp Syncytial Virus by PCR NEGATIVE NEGATIVE Final    Comment: (NOTE) Fact Sheet for Patients: BloggerCourse.com  Fact Sheet for Healthcare Providers: SeriousBroker.it  This test is not yet approved or cleared by the [place] FDA and has been authorized for detection and/or diagnosis of SARS-CoV-2 by FDA under an Emergency Use Authorization (EUA). This EUA will remain in effect (meaning this test can be  used) for the duration of the COVID-19 declaration under Section 564(b)(1) of the Act, 21 U.S.C. section 360bbb-3(b)(1), unless the authorization is terminated or revoked.  Performed at [facility], [address] [place], [place] [address]      Radiology Studies: CT HEAD WO CONTRAST ( )  Result Date: 05/04/2023 CLINICAL DATA:  Headache EXAM: CT HEAD WITHOUT CONTRAST TECHNIQUE: Contiguous axial images were obtained from the base of the skull through the vertex without intravenous contrast. RADIATION DOSE REDUCTION: This exam was performed according to the departmental dose-optimization program which includes automated exposure control, adjustment of the mA and/or kV according to patient size and/or use of iterative reconstruction technique. COMPARISON:  None Available. FINDINGS: Brain: No evidence of acute infarction, hemorrhage, hydrocephalus, extra-axial collection or mass lesion/mass effect. Subcortical white matter and periventricular small vessel ischemic changes. Vascular: Mild intracranial atherosclerosis. Skull: Normal. Negative for fracture or focal lesion. Sinuses/Orbits: The visualized paranasal sinuses are essentially clear. The mastoid air cells are unopacified. Other: None. IMPRESSION: No acute intracranial abnormality. Small vessel ischemic changes. Electronically Signed   By: Roselie Awkward.D.  On: 05/04/2023 01:10   CT Angio Chest PE W and/or Wo Contrast  Result Date: 05/03/2023 CLINICAL DATA:  High prob PE suspected, flu, heart issues EXAM: CT ANGIOGRAPHY CHEST WITH CONTRAST TECHNIQUE: Multidetector CT imaging of the chest was performed using the standard protocol during bolus administration of intravenous contrast. Multiplanar CT image reconstructions and MIPs were obtained to evaluate the vascular anatomy. RADIATION DOSE REDUCTION: This exam was performed according to the departmental dose-optimization program which includes automated exposure control,  adjustment of the mA and/or kV according to patient size and/or use of iterative reconstruction technique. CONTRAST:  75mL OMNIPAQUE IOHEXOL 350 MG/ML SOLN COMPARISON:  02/11/2021 FINDINGS: Cardiovascular: SVC patent. Heart size normal. No pericardial effusion. The RV is nondilated. Satisfactory opacification of pulmonary arteries noted, and there is no evidence of pulmonary emboli. There is a separate lingular pulmonary vein, anatomic variant. Adequate contrast opacification of the thoracic aorta with no evidence of dissection, aneurysm, or stenosis. There is classic 3-vessel brachiocephalic arch anatomy without proximal stenosis. Minimal calcified plaque in the descending thoracic aorta Mediastinum/Nodes: No mass, hematoma, or adenopathy. Lungs/Pleura: No pleural effusion. No pneumothorax. Mild dependent atelectasis posteriorly in the lung bases with some linear scarring or subsegmental atelectasis, slightly increased from prior exam. Upper Abdomen: Cholecystectomy clips.  No acute findings. Musculoskeletal: No chest wall abnormality. No acute or significant osseous findings. Review of the MIP images confirms the above findings. IMPRESSION: 1. Negative for acute PE or thoracic aortic dissection. 2. Mild dependent atelectasis in the lung bases. 3. Aortic Atherosclerosis (ICD10-I70.0). Electronically Signed   By: Corlis Leak M.D.   On: 05/03/2023 20:36   DG Chest 2 View  Result Date: 05/03/2023 CLINICAL DATA:  Cough since traveling on plain. EXAM: CHEST - 2 VIEW COMPARISON:  Chest radiographs 10/07/2020 and 03/25/2018 FINDINGS: Cardiac silhouette and mediastinal contours are within limits. Flattening of the diaphragms and mild-to-moderate hyperinflation, similar to prior. Increased lucencies are again seen within the bilateral upper lungs compatible with chronic emphysematous changes. Multiple partially calcified blood vessels on end are similar to prior within the bilateral hilar regions. No acute lung airspace  opacity. No definite pleural effusion. No pneumothorax. Mild-to-moderate multilevel degenerative disc changes of the thoracic spine. IMPRESSION: 1. No acute lung process. 2. Chronic emphysematous changes. Electronically Signed   By: Neita Garnet M.D.   On: 05/03/2023 17:00     Scheduled Meds:  gabapentin  400 mg Oral QHS   guaiFENesin  600 mg Oral BID   levothyroxine  50 mcg Oral Q0600   metoprolol succinate  12.5 mg Oral Daily   oseltamivir  30 mg Oral BID   rosuvastatin  5 mg Oral Daily   Continuous Infusions:   LOS: 0 days    Time spent:    Zannie Cove, MD [facility]   05/04/2023, 10:36 AM

## 2023-05-04 NOTE — Progress Notes (Signed)
Transition of Care [facility]) - Inpatient Brief Assessment   Patient Details  Name: Jenevy Aherne MRN: [id number] Date of Birth: 1940-08-19  Transition of [facility]) CM/SW Contact:    Janae Bridgeman, RN Phone Number: 05/04/2023, 3:42 PM   Clinical Narrative: Patient admitted to the hospital from home - influenza.  She lives alone and is currently on room air.  The patient states that she still feels weak when ambulating.  Medicare Observation notice provided to the patient.  Patient does not have family in the area with daughter in [place] and son living in [place].  Patient plans to have friend provide transportation to home.  Patient has a housekeeper who plans to stay with her starting on Friday evening.  No other TOC needs at this time.   Transition of Care Asessment: Insurance and Status: (P) Insurance coverage has been reviewed Patient has primary care physician: (P) Yes Home environment has been reviewed: (P) from home alone Prior level of function:: (P) Independent Prior/Current Home Services: (P) No current home services Social Determinants of Health Reivew: (P) SDOH reviewed interventions complete Readmission risk has been reviewed: (P) Yes Transition of care needs: (P) transition of care needs identified, TOC will continue to follow

## 2023-05-04 NOTE — Plan of Care (Signed)

## 2023-05-04 NOTE — Care Management Obs Status (Signed)
MEDICARE OBSERVATION STATUS NOTIFICATION   Patient Details  Name: [name] MRN: [id number] Date of Birth: 09/14/1940   Medicare Observation Status Notification Given:  Yes    Janae Bridgeman, RN 05/04/2023, 3:19 PM

## 2023-05-05 ENCOUNTER — Observation Stay (HOSPITAL_COMMUNITY): Payer: Medicare PPO

## 2023-05-05 DIAGNOSIS — Z79890 Hormone replacement therapy (postmenopausal): Secondary | ICD-10-CM | POA: Diagnosis not present

## 2023-05-05 DIAGNOSIS — Z8249 Family history of ischemic heart disease and other diseases of the circulatory system: Secondary | ICD-10-CM | POA: Diagnosis not present

## 2023-05-05 DIAGNOSIS — Z888 Allergy status to other drugs, medicaments and biological substances status: Secondary | ICD-10-CM | POA: Diagnosis not present

## 2023-05-05 DIAGNOSIS — E785 Hyperlipidemia, unspecified: Secondary | ICD-10-CM | POA: Diagnosis present

## 2023-05-05 DIAGNOSIS — Z1152 Encounter for screening for COVID-19: Secondary | ICD-10-CM | POA: Diagnosis not present

## 2023-05-05 DIAGNOSIS — Z803 Family history of malignant neoplasm of breast: Secondary | ICD-10-CM | POA: Diagnosis not present

## 2023-05-05 DIAGNOSIS — Z79899 Other long term (current) drug therapy: Secondary | ICD-10-CM | POA: Diagnosis not present

## 2023-05-05 DIAGNOSIS — J439 Emphysema, unspecified: Secondary | ICD-10-CM | POA: Diagnosis present

## 2023-05-05 DIAGNOSIS — E039 Hypothyroidism, unspecified: Secondary | ICD-10-CM | POA: Diagnosis present

## 2023-05-05 DIAGNOSIS — Z90710 Acquired absence of both cervix and uterus: Secondary | ICD-10-CM | POA: Diagnosis not present

## 2023-05-05 DIAGNOSIS — Z800 Family history of malignant neoplasm of digestive organs: Secondary | ICD-10-CM | POA: Diagnosis not present

## 2023-05-05 DIAGNOSIS — Z881 Allergy status to other antibiotic agents status: Secondary | ICD-10-CM | POA: Diagnosis not present

## 2023-05-05 DIAGNOSIS — Z818 Family history of other mental and behavioral disorders: Secondary | ICD-10-CM | POA: Diagnosis not present

## 2023-05-05 DIAGNOSIS — R0902 Hypoxemia: Secondary | ICD-10-CM | POA: Diagnosis not present

## 2023-05-05 DIAGNOSIS — J101 Influenza due to other identified influenza virus with other respiratory manifestations: Secondary | ICD-10-CM | POA: Diagnosis present

## 2023-05-05 DIAGNOSIS — Z886 Allergy status to analgesic agent status: Secondary | ICD-10-CM | POA: Diagnosis not present

## 2023-05-05 DIAGNOSIS — Z96641 Presence of right artificial hip joint: Secondary | ICD-10-CM | POA: Diagnosis present

## 2023-05-05 DIAGNOSIS — Z9049 Acquired absence of other specified parts of digestive tract: Secondary | ICD-10-CM | POA: Diagnosis not present

## 2023-05-05 DIAGNOSIS — E860 Dehydration: Secondary | ICD-10-CM | POA: Diagnosis present

## 2023-05-05 DIAGNOSIS — I493 Ventricular premature depolarization: Secondary | ICD-10-CM | POA: Diagnosis present

## 2023-05-05 DIAGNOSIS — M797 Fibromyalgia: Secondary | ICD-10-CM | POA: Diagnosis present

## 2023-05-05 DIAGNOSIS — Z833 Family history of diabetes mellitus: Secondary | ICD-10-CM | POA: Diagnosis not present

## 2023-05-05 DIAGNOSIS — I10 Essential (primary) hypertension: Secondary | ICD-10-CM | POA: Diagnosis present

## 2023-05-05 DIAGNOSIS — R0602 Shortness of breath: Secondary | ICD-10-CM | POA: Diagnosis present

## 2023-05-05 DIAGNOSIS — L4050 Arthropathic psoriasis, unspecified: Secondary | ICD-10-CM | POA: Diagnosis present

## 2023-05-05 DIAGNOSIS — K219 Gastro-esophageal reflux disease without esophagitis: Secondary | ICD-10-CM | POA: Diagnosis present

## 2023-05-05 DIAGNOSIS — J984 Other disorders of lung: Secondary | ICD-10-CM | POA: Diagnosis not present

## 2023-05-05 DIAGNOSIS — R918 Other nonspecific abnormal finding of lung field: Secondary | ICD-10-CM | POA: Diagnosis not present

## 2023-05-05 MED ORDER — POTASSIUM CHLORIDE CRYS ER 20 MEQ PO TBCR
40.0000 meq | EXTENDED_RELEASE_TABLET | Freq: Once | ORAL | Status: AC
  Administered 2023-05-05: 40 meq via ORAL
  Filled 2023-05-05: qty 2

## 2023-05-05 NOTE — Progress Notes (Signed)
PROGRESS NOTE    [name]  ZOX:[id number] DOB: 1940/12/10 DOA: 05/03/2023 PCP: Myrlene Broker, MD  82/F with history of psoriatic arthritis, fibromyalgia, migraine, dyslipidemia, GERD, PVCs presented to the ED last night with cough fatigue fevers headache nausea vomiting.  In the ED low-grade temps, basic labs unremarkable, positive for influenza A   Subjective: -Feels terrible, low-grade fever this morning, congested, short of breath  Assessment and Plan:  Influenza A -Continue Tamiflu day 3/5, supportive care,  -Mucinex -Albuterol as needed, will check x-ray today   H/o Migraine Headaches somewhat worse in the setting of above -Supportive care, Fioricet   Psoriatic arthritis (HCC) Hold Plaquenil   PVCs Essential hypertension -Continue   Hyperlipidemia Continue Crestor 5 mg a day    DVT prophylaxis:  SCD   Code Status: Full code Family Communication: None present Disposition Plan: Home tomorrow if stable  Consultants:    Procedures:   Antimicrobials:    Objective: Vitals:   05/04/23 1948 05/05/23 0617 05/05/23 0735 05/05/23 1031  BP: (!) 122/56 (!) 140/58 (!) 148/61   Pulse: 74 81 89   Resp: 18 17 17    Temp: 98.4 F (36.9 C) 99.2 F (37.3 C) 99.4 F (37.4 C) 100.1 F (37.8 C)  TempSrc: Oral Oral Oral Oral  SpO2: 96% 91% 91%   Weight:      Height:        Intake/Output Summary (Last 24 hours) at 05/05/2023 1132 Last data filed at 05/05/2023 0915 Gross per 24 hour  Intake 360 ml  Output --  Net 360 ml   Filed Weights   05/03/23 2132  Weight: 59 kg    Examination:  General exam: Elderly female sitting up in bed, ill-appearing, AAOx3 CVS: S1-S2, regular rhythm Lungs: Few scattered basilar rhonchi Abdomen: Soft, nontender, bowel sounds present Extremities: No edema Psych: Flat affect    Data Reviewed:   CBC: Recent Labs  Lab 05/03/23 1641 05/04/23 0628  WBC 6.3 4.8  NEUTROABS 5.5  --   HGB 12.8 11.6*  HCT 40.1  36.1  MCV 102.8* 102.0*  PLT 106* 102*   Basic Metabolic Panel: Recent Labs  Lab 05/03/23 1641 05/04/23 0628  NA 138 136  K 3.5 3.3*  CL 106 110  CO2 23 20*  GLUCOSE 107* 91  BUN 8 5*  CREATININE 0.74 0.57  CALCIUM 8.5* 7.7*  MG 1.8 2.0  PHOS 2.6 3.5   GFR: Estimated Creatinine Clearance: 51.5 mL/min (by C-G formula based on SCr of 0.57 mg/dL). Liver Function Tests: Recent Labs  Lab 05/03/23 1641 05/04/23 0628  AST 21  21 18   ALT 15  14 13   ALKPHOS 50  51 42  BILITOT 1.3*  1.2* 1.2*  PROT 6.2*  6.3* 5.3*  ALBUMIN 3.6  3.7 3.1*   No results for input(s): "LIPASE", "AMYLASE" in the last 168 hours. No results for input(s): "AMMONIA" in the last 168 hours. Coagulation Profile: No results for input(s): "INR", "PROTIME" in the last 168 hours. Cardiac Enzymes: Recent Labs  Lab 05/03/23 1641  CKTOTAL 47   BNP (last 3 results) No results for input(s): "PROBNP" in the last 8760 hours. HbA1C: No results for input(s): "HGBA1C" in the last 72 hours. CBG: No results for input(s): "GLUCAP" in the last 168 hours. Lipid Profile: No results for input(s): "CHOL", "HDL", "LDLCALC", "TRIG", "CHOLHDL", "LDLDIRECT" in the last 72 hours. Thyroid Function Tests: Recent Labs    05/03/23 1641  TSH 0.698   Anemia Panel: No results  for input(s): "VITAMINB12", "FOLATE", "FERRITIN", "TIBC", "IRON", "RETICCTPCT" in the last 72 hours. Urine analysis:    Component Value Date/Time   COLORURINE YELLOW 05/04/2023 0548   APPEARANCEUR CLEAR 05/04/2023 0548   LABSPEC 1.026 05/04/2023 0548   PHURINE 5.0 05/04/2023 0548   GLUCOSEU NEGATIVE 05/04/2023 0548   HGBUR NEGATIVE 05/04/2023 0548   BILIRUBINUR NEGATIVE 05/04/2023 0548   BILIRUBINUR negative 05/11/2022 1615   KETONESUR 20 (A) 05/04/2023 0548   PROTEINUR NEGATIVE 05/04/2023 0548   UROBILINOGEN negative (A) 05/11/2022 1615   UROBILINOGEN 0.2 07/18/2012 1515   NITRITE NEGATIVE 05/04/2023 0548   LEUKOCYTESUR NEGATIVE  05/04/2023 0548   Sepsis Labs: @LABRCNTIP (procalcitonin:4,lacticidven:4)  ) Recent Results (from the past 240 hours)  Resp panel by RT-PCR (RSV, Flu A&B, Covid) Anterior Nasal Swab     Status: Abnormal   Collection Time: 05/03/23  5:00 PM   Specimen: Anterior Nasal Swab  Result Value Ref Range Status   SARS Coronavirus 2 by RT PCR NEGATIVE NEGATIVE Final   Influenza A by PCR POSITIVE (A) NEGATIVE Final   Influenza B by PCR NEGATIVE NEGATIVE Final    Comment: (NOTE) The Xpert Xpress SARS-CoV-2/FLU/RSV plus assay is intended as an aid in the diagnosis of influenza from Nasopharyngeal swab specimens and should not be used as a sole basis for treatment. Nasal washings and aspirates are unacceptable for Xpert Xpress SARS-CoV-2/FLU/RSV testing.  Fact Sheet for Patients: BloggerCourse.com  Fact Sheet for Healthcare Providers: SeriousBroker.it  This test is not yet approved or cleared by the [place] FDA and has been authorized for detection and/or diagnosis of SARS-CoV-2 by FDA under an Emergency Use Authorization (EUA). This EUA will remain in effect (meaning this test can be used) for the duration of the COVID-19 declaration under Section 564(b)(1) of the Act, 21 U.S.C. section 360bbb-3(b)(1), unless the authorization is terminated or revoked.     Resp Syncytial Virus by PCR NEGATIVE NEGATIVE Final    Comment: (NOTE) Fact Sheet for Patients: BloggerCourse.com  Fact Sheet for Healthcare Providers: SeriousBroker.it  This test is not yet approved or cleared by the [place] FDA and has been authorized for detection and/or diagnosis of SARS-CoV-2 by FDA under an Emergency Use Authorization (EUA). This EUA will remain in effect (meaning this test can be used) for the duration of the COVID-19 declaration under Section 564(b)(1) of the Act, 21 U.S.C. section 360bbb-3(b)(1),  unless the authorization is terminated or revoked.  Performed at [facility], [address], [place], [place] [address]      Radiology Studies: CT HEAD WO CONTRAST ( ) Result Date: 05/04/2023 CLINICAL DATA:  Headache EXAM: CT HEAD WITHOUT CONTRAST TECHNIQUE: Contiguous axial images were obtained from the base of the skull through the vertex without intravenous contrast. RADIATION DOSE REDUCTION: This exam was performed according to the departmental dose-optimization program which includes automated exposure control, adjustment of the mA and/or kV according to patient size and/or use of iterative reconstruction technique. COMPARISON:  None Available. FINDINGS: Brain: No evidence of acute infarction, hemorrhage, hydrocephalus, extra-axial collection or mass lesion/mass effect. Subcortical white matter and periventricular small vessel ischemic changes. Vascular: Mild intracranial atherosclerosis. Skull: Normal. Negative for fracture or focal lesion. Sinuses/Orbits: The visualized paranasal sinuses are essentially clear. The mastoid air cells are unopacified. Other: None. IMPRESSION: No acute intracranial abnormality. Small vessel ischemic changes. Electronically Signed   By: Charline Bills M.D.   On: 05/04/2023 01:10   CT Angio Chest PE W and/or Wo Contrast Result Date: 05/03/2023 CLINICAL DATA:  High prob PE suspected, flu, heart issues EXAM: CT ANGIOGRAPHY CHEST WITH CONTRAST TECHNIQUE: Multidetector CT imaging of the chest was performed using the standard protocol during bolus administration of intravenous contrast. Multiplanar CT image reconstructions and MIPs were obtained to evaluate the vascular anatomy. RADIATION DOSE REDUCTION: This exam was performed according to the departmental dose-optimization program which includes automated exposure control, adjustment of the mA and/or kV according to patient size and/or use of iterative reconstruction technique. CONTRAST:  75mL OMNIPAQUE  IOHEXOL 350 MG/ML SOLN COMPARISON:  02/11/2021 FINDINGS: Cardiovascular: SVC patent. Heart size normal. No pericardial effusion. The RV is nondilated. Satisfactory opacification of pulmonary arteries noted, and there is no evidence of pulmonary emboli. There is a separate lingular pulmonary vein, anatomic variant. Adequate contrast opacification of the thoracic aorta with no evidence of dissection, aneurysm, or stenosis. There is classic 3-vessel brachiocephalic arch anatomy without proximal stenosis. Minimal calcified plaque in the descending thoracic aorta Mediastinum/Nodes: No mass, hematoma, or adenopathy. Lungs/Pleura: No pleural effusion. No pneumothorax. Mild dependent atelectasis posteriorly in the lung bases with some linear scarring or subsegmental atelectasis, slightly increased from prior exam. Upper Abdomen: Cholecystectomy clips.  No acute findings. Musculoskeletal: No chest wall abnormality. No acute or significant osseous findings. Review of the MIP images confirms the above findings. IMPRESSION: 1. Negative for acute PE or thoracic aortic dissection. 2. Mild dependent atelectasis in the lung bases. 3. Aortic Atherosclerosis (ICD10-I70.0). Electronically Signed   By: Corlis Leak M.D.   On: 05/03/2023 20:36   DG Chest 2 View Result Date: 05/03/2023 CLINICAL DATA:  Cough since traveling on plain. EXAM: CHEST - 2 VIEW COMPARISON:  Chest radiographs 10/07/2020 and 03/25/2018 FINDINGS: Cardiac silhouette and mediastinal contours are within limits. Flattening of the diaphragms and mild-to-moderate hyperinflation, similar to prior. Increased lucencies are again seen within the bilateral upper lungs compatible with chronic emphysematous changes. Multiple partially calcified blood vessels on end are similar to prior within the bilateral hilar regions. No acute lung airspace opacity. No definite pleural effusion. No pneumothorax. Mild-to-moderate multilevel degenerative disc changes of the thoracic spine.  IMPRESSION: 1. No acute lung process. 2. Chronic emphysematous changes. Electronically Signed   By: Neita Garnet M.D.   On: 05/03/2023 17:00     Scheduled Meds:  gabapentin  400 mg Oral QHS   guaiFENesin  600 mg Oral BID   levothyroxine  50 mcg Oral Q0600   metoprolol succinate  12.5 mg Oral Daily   oseltamivir  30 mg Oral BID   rosuvastatin  5 mg Oral Daily   Continuous Infusions:   LOS: 0 days    Time spent:    Zannie Cove, MD [facility]   05/05/2023, 11:32 AM

## 2023-05-05 NOTE — Plan of Care (Signed)
  Problem: Education: Goal: Knowledge of General Education information will improve Description: Including pain rating scale, medication(s)/side effects and non-pharmacologic comfort measures Outcome: Progressing   Problem: Health Behavior/Discharge Planning: Goal: Ability to manage health-related needs will improve Outcome: Progressing   Problem: Clinical Measurements: Goal: Ability to maintain clinical measurements within normal limits will improve Outcome: Progressing Goal: Will remain free from infection Outcome: Progressing Goal: Diagnostic test results will improve Outcome: Progressing Goal: Respiratory complications will improve Outcome: Progressing Goal: Cardiovascular complication will be avoided Outcome: Progressing   Problem: Activity: Goal: Risk for activity intolerance will decrease Outcome: Progressing   Problem: Nutrition: Goal: Adequate nutrition will be maintained Outcome: Progressing   Problem: Coping: Goal: Level of anxiety will decrease Outcome: Progressing   Problem: Elimination: Goal: Will not experience complications related to bowel motility Outcome: Progressing Goal: Will not experience complications related to urinary retention Outcome: Progressing   Problem: Pain Management: Goal: General experience of comfort will improve Outcome: Progressing   Problem: Skin Integrity: Goal: Risk for impaired skin integrity will decrease Outcome: Progressing

## 2023-05-05 NOTE — Plan of Care (Signed)

## 2023-05-06 DIAGNOSIS — J101 Influenza due to other identified influenza virus with other respiratory manifestations: Secondary | ICD-10-CM | POA: Diagnosis not present

## 2023-05-06 LAB — BASIC METABOLIC PANEL
Anion gap: 8 (ref 5–15)
BUN: 13 mg/dL (ref 8–23)
CO2: 24 mmol/L (ref 22–32)
Calcium: 8 mg/dL — ABNORMAL LOW (ref 8.9–10.3)
Chloride: 106 mmol/L (ref 98–111)
Creatinine, Ser: 0.93 mg/dL (ref 0.44–1.00)
GFR, Estimated: >60 mL/min (ref >60)
Glucose, Bld: 102 mg/dL — ABNORMAL HIGH (ref 70–99)
Potassium: 3.5 mmol/L (ref 3.5–5.1)
Sodium: 138 mmol/L (ref 135–145)

## 2023-05-06 LAB — LACTOFERRIN, FECAL, QUALITATIVE: Lactoferrin, Fecal, Qual: POSITIVE — AB

## 2023-05-06 LAB — CBC
HCT: 34.8 % — ABNORMAL LOW (ref 36.0–46.0)
Hemoglobin: 11.6 g/dL — ABNORMAL LOW (ref 12.0–15.0)
MCH: 33 pg (ref 26.0–34.0)
MCHC: 33.3 g/dL (ref 30.0–36.0)
MCV: 99.1 fL (ref 80.0–100.0)
Platelets: 101 10*3/uL — ABNORMAL LOW (ref 150–400)
RBC: 3.51 MIL/uL — ABNORMAL LOW (ref 3.87–5.11)
RDW: 13 % (ref 11.5–15.5)
WBC: 7.2 10*3/uL (ref 4.0–10.5)
nRBC: 0 % (ref 0.0–0.2)

## 2023-05-06 NOTE — Plan of Care (Signed)

## 2023-05-06 NOTE — Progress Notes (Signed)
BP 96/48, Odie Sera, MD was notified.   [name]

## 2023-05-06 NOTE — Progress Notes (Signed)
PROGRESS NOTE    [name]  QMV:[id number] DOB: [birth year] DOA: 05/03/2023 PCP: Myrlene Broker, MD   Brief history: Patient is an 82 year old Caucasian female with past medical history significant for psoriatic arthritis, fibromyalgia, migraine, dyslipidemia, GERD and PVCs.  Patient presented with cough, fatigue, fevers, headache, nausea and vomiting.  Workup revealed positive influenza A.  Chest x-ray revealed right lung changes.  However, patient has remained afebrile.  Procalcitonin is less than 0.1.  Patient is on Tamiflu.    Subjective: -Patient seen. -Patient reports some improvement. No fever.    Assessment and Plan:  Influenza A/viral syndrome: -Continue and complete course of Tamiflu. -Monitor closely, and continue to assess for postviral pneumonia.   -Supportive care. -Albuterol as needed   H/o Migraine Headaches somewhat worse in the setting of above -Supportive care, Fioricet   Psoriatic arthritis (HCC) Hold Plaquenil   PVCs Essential hypertension -Continue   Hyperlipidemia Continue Crestor 5 mg a day    DVT prophylaxis:  SCD   Code Status: Full code Family Communication: None present Disposition Plan: Home tomorrow if stable  Consultants:    Procedures:   Antimicrobials:    Objective: Vitals:   05/06/23 0847 05/06/23 0900 05/06/23 0949 05/06/23 0951  BP: (!) 92/45 (!) 98/50 (!) 111/49 (!) 105/46  Pulse: 70  71 73  Resp: 18     Temp: 99.5 F (37.5 C)     TempSrc: Oral     SpO2: 93%     Weight:      Height:        Intake/Output Summary (Last 24 hours) at 05/06/2023 1110 Last data filed at 05/06/2023 0943 Gross per 24 hour  Intake 170 ml  Output --  Net 170 ml   Filed Weights   05/03/23 2132  Weight: 59 kg    Examination:  General exam: Not in any distress.  Patient looks weak.  Awake and alert.   CVS: S1-S2, regular rhythm Lungs: Crackles right lung base posteriorly.   Abdomen: Soft, nontender, bowel sounds  present Extremities: No edema Neuro: Awake and alert.  Moves all extremities.   Data Reviewed:   CBC: Recent Labs  Lab 05/03/23 1641 05/04/23 0628 05/06/23 0740  WBC 6.3 4.8 7.2  NEUTROABS 5.5  --   --   HGB 12.8 11.6* 11.6*  HCT 40.1 36.1 34.8*  MCV 102.8* 102.0* 99.1  PLT 106* 102* 101*   Basic Metabolic Panel: Recent Labs  Lab 05/03/23 1641 05/04/23 0628 05/06/23 0740  NA 138 136 138  K 3.5 3.3* 3.5  CL 106 110 106  CO2 23 20* 24  GLUCOSE 107* 91 102*  BUN 8 5* 13  CREATININE 0.74 0.57 0.93  CALCIUM 8.5* 7.7* 8.0*  MG 1.8 2.0  --   PHOS 2.6 3.5  --    GFR: Estimated Creatinine Clearance: 44.3 mL/min (by C-G formula based on SCr of 0.93 mg/dL). Liver Function Tests: Recent Labs  Lab 05/03/23 1641 05/04/23 0628  AST 21  21 18   ALT 15  14 13   ALKPHOS 50  51 42  BILITOT 1.3*  1.2* 1.2*  PROT 6.2*  6.3* 5.3*  ALBUMIN 3.6  3.7 3.1*   No results for input(s): "LIPASE", "AMYLASE" in the last 168 hours. No results for input(s): "AMMONIA" in the last 168 hours. Coagulation Profile: No results for input(s): "INR", "PROTIME" in the last 168 hours. Cardiac Enzymes: Recent Labs  Lab 05/03/23 1641  CKTOTAL 47   BNP (last 3 results) No  results for input(s): "PROBNP" in the last 8760 hours. HbA1C: No results for input(s): "HGBA1C" in the last 72 hours. CBG: No results for input(s): "GLUCAP" in the last 168 hours. Lipid Profile: No results for input(s): "CHOL", "HDL", "LDLCALC", "TRIG", "CHOLHDL", "LDLDIRECT" in the last 72 hours. Thyroid Function Tests: Recent Labs    05/03/23 1641  TSH 0.698   Anemia Panel: No results for input(s): "VITAMINB12", "FOLATE", "FERRITIN", "TIBC", "IRON", "RETICCTPCT" in the last 72 hours. Urine analysis:    Component Value Date/Time   COLORURINE YELLOW 05/04/2023 0548   APPEARANCEUR CLEAR 05/04/2023 0548   LABSPEC 1.026 05/04/2023 0548   PHURINE 5.0 05/04/2023 0548   GLUCOSEU NEGATIVE 05/04/2023 0548   HGBUR  NEGATIVE 05/04/2023 0548   BILIRUBINUR NEGATIVE 05/04/2023 0548   BILIRUBINUR negative 05/11/2022 1615   KETONESUR 20 (A) 05/04/2023 0548   PROTEINUR NEGATIVE 05/04/2023 0548   UROBILINOGEN negative (A) 05/11/2022 1615   UROBILINOGEN 0.2 07/18/2012 1515   NITRITE NEGATIVE 05/04/2023 0548   LEUKOCYTESUR NEGATIVE 05/04/2023 0548   Sepsis Labs: @LABRCNTIP (procalcitonin:4,lacticidven:4)  ) Recent Results (from the past 240 hours)  Resp panel by RT-PCR (RSV, Flu A&B, Covid) Anterior Nasal Swab     Status: Abnormal   Collection Time: 05/03/23  5:00 PM   Specimen: Anterior Nasal Swab  Result Value Ref Range Status   SARS Coronavirus 2 by RT PCR NEGATIVE NEGATIVE Final   Influenza A by PCR POSITIVE (A) NEGATIVE Final   Influenza B by PCR NEGATIVE NEGATIVE Final    Comment: (NOTE) The Xpert Xpress SARS-CoV-2/FLU/RSV plus assay is intended as an aid in the diagnosis of influenza from Nasopharyngeal swab specimens and should not be used as a sole basis for treatment. Nasal washings and aspirates are unacceptable for Xpert Xpress SARS-CoV-2/FLU/RSV testing.  Fact Sheet for Patients: BloggerCourse.com  Fact Sheet for Healthcare Providers: SeriousBroker.it  This test is not yet approved or cleared by the [place] FDA and has been authorized for detection and/or diagnosis of SARS-CoV-2 by FDA under an Emergency Use Authorization (EUA). This EUA will remain in effect (meaning this test can be used) for the duration of the COVID-19 declaration under Section 564(b)(1) of the Act, 21 U.S.C. section 360bbb-3(b)(1), unless the authorization is terminated or revoked.     Resp Syncytial Virus by PCR NEGATIVE NEGATIVE Final    Comment: (NOTE) Fact Sheet for Patients: BloggerCourse.com  Fact Sheet for Healthcare Providers: SeriousBroker.it  This test is not yet approved or cleared by the  [place] FDA and has been authorized for detection and/or diagnosis of SARS-CoV-2 by FDA under an Emergency Use Authorization (EUA). This EUA will remain in effect (meaning this test can be used) for the duration of the COVID-19 declaration under Section 564(b)(1) of the Act, 21 U.S.C. section 360bbb-3(b)(1), unless the authorization is terminated or revoked.  Performed at [facility], [address], [place], [place] [address]      Radiology Studies: DG CHEST PORT 1 VIEW Result Date: 05/05/2023 CLINICAL DATA:  Hypoxia. EXAM: PORTABLE CHEST 1 VIEW COMPARISON:  CT and chest radiograph from 05/03/2023 FINDINGS: New patchy densities in the perihilar and medial right lower lung. No definite new densities in the left lung. Heart size is within normal limits and stable. The trachea is midline. Negative for a pneumothorax. No acute bone abnormality. IMPRESSION: New patchy densities in the right lung. Findings are concerning for pneumonia. Recommend follow-up to ensure resolution. Electronically Signed   By: Richarda Overlie M.D.   On: 05/05/2023  16:44     Scheduled Meds:  gabapentin  400 mg Oral QHS   guaiFENesin  600 mg Oral BID   levothyroxine  50 mcg Oral Q0600   metoprolol succinate  12.5 mg Oral Daily   oseltamivir  30 mg Oral BID   rosuvastatin  5 mg Oral Daily   Continuous Infusions:   LOS: 1 day    Time spent:    Barnetta Chapel, MD [facility]   05/06/2023, 11:10 AM

## 2023-05-07 ENCOUNTER — Inpatient Hospital Stay (HOSPITAL_COMMUNITY): Payer: Medicare PPO

## 2023-05-07 DIAGNOSIS — J101 Influenza due to other identified influenza virus with other respiratory manifestations: Secondary | ICD-10-CM | POA: Diagnosis not present

## 2023-05-07 LAB — RENAL FUNCTION PANEL
Albumin: 3 g/dL — ABNORMAL LOW (ref 3.5–5.0)
Anion gap: 10 (ref 5–15)
BUN: 11 mg/dL (ref 8–23)
CO2: 25 mmol/L (ref 22–32)
Calcium: 8.2 mg/dL — ABNORMAL LOW (ref 8.9–10.3)
Chloride: 104 mmol/L (ref 98–111)
Creatinine, Ser: 1.01 mg/dL — ABNORMAL HIGH (ref 0.44–1.00)
GFR, Estimated: 56 mL/min — ABNORMAL LOW (ref >60)
Glucose, Bld: 105 mg/dL — ABNORMAL HIGH (ref 70–99)
Phosphorus: 2.6 mg/dL (ref 2.5–4.6)
Potassium: 3.8 mmol/L (ref 3.5–5.1)
Sodium: 139 mmol/L (ref 135–145)

## 2023-05-07 MED ORDER — ACETAMINOPHEN 325 MG PO TABS: 650.0000 mg | ORAL_TABLET | Freq: Four times a day (QID) | ORAL | Status: DC | PRN

## 2023-05-07 MED ORDER — GUAIFENESIN ER 600 MG PO TB12: 600.0000 mg | ORAL_TABLET | Freq: Two times a day (BID) | ORAL | 0 refills | Status: DC | PRN

## 2023-05-07 MED ORDER — OSELTAMIVIR PHOSPHATE 30 MG PO CAPS: 30.0000 mg | ORAL_CAPSULE | Freq: Two times a day (BID) | ORAL | 0 refills | Status: AC

## 2023-05-07 MED ORDER — GABAPENTIN 400 MG PO CAPS: 400.0000 mg | ORAL_CAPSULE | Freq: Every day | ORAL | 1 refills | Status: AC

## 2023-05-07 MED ORDER — OSELTAMIVIR PHOSPHATE 30 MG PO CAPS: 30.0000 mg | ORAL_CAPSULE | Freq: Two times a day (BID) | ORAL | 0 refills | Status: DC

## 2023-05-07 NOTE — Discharge Summary (Signed)
Physician Discharge Summary  Patient ID: [name] MRN: [id number] DOB/AGE: [birth year] 80 y.o.  Admit date: 05/03/2023 Discharge date: 05/07/2023  Admission Diagnoses:  Discharge Diagnoses:  Principal Problem:   Influenza A Active Problems:   Psoriatic arthritis (HCC)   Hyperlipidemia   Migraine   PVC's (premature ventricular contractions)   Essential hypertension   Dehydration   Discharged Condition: stable  Hospital Course:  Patient is an 82 year old Caucasian female with past medical history significant for psoriatic arthritis, fibromyalgia, migraine, dyslipidemia, GERD and PVCs.  Patient presented with cough, fatigue, fevers, headache, nausea and vomiting.  Workup revealed positive influenza A.  Chest x-ray revealed right lung changes.  However, patient has remained afebrile.  Procalcitonin was less than 0.1.  Patient has been on Tamiflu.  The patient has continued to improve and will be discharged back home today.  Patient will follow-up primary care provider within 1 week of discharge.  Influenza A/viral syndrome: -Complete course of Tamiflu. -Monitor closely  -Supportive care. -Albuterol as needed   H/o Migraine -Supportive care, Fioricet   Psoriatic arthritis (HCC) Plaquenil   PVCs Essential hypertension -Continue   Hyperlipidemia Continue Crestor 5 mg a day  Consults: None  Significant Diagnostic Studies:  Chest x-ray done on 05/05/2023 revealed: New patchy densities in the right lung. Findings are concerning for pneumonia. Recommend follow-up to ensure resolution.  Influenza A positive.  Discharge Exam: Blood pressure (!) 128/50, pulse 88, temperature 99.6 F (37.6 C), temperature source Oral, resp. rate 18, height 5' 5.5" (1.664 m), weight 59 kg, SpO2 95%.   Disposition: Discharge disposition: 01-Home or Self Care       Discharge Instructions     Diet - low sodium heart healthy   Complete by: As directed    Increase activity  slowly   Complete by: As directed       Allergies as of 05/07/2023       Reactions   Imitrex [sumatriptan] Nausea And Vomiting, Hypertension   Nsaids Other (See Comments)   Genella Rife as result taking.   Ciprofloxacin Hcl    Keflex [cephalexin] Nausea And Vomiting        Medication List     STOP taking these medications    butalbital-aspirin-caffeine 50-325-40 MG capsule Commonly known as: FIORINAL   sulfaSALAzine 500 MG tablet Commonly known as: AZULFIDINE   VITAMIN D PO       TAKE these medications    acetaminophen 325 MG tablet Commonly known as: TYLENOL Take 2 tablets (650 mg total) by mouth every 6 (six) hours as needed for mild pain (pain score 1-3) (or Fever >/= 101).   clobetasol 0.05 % external solution Commonly known as: TEMOVATE Apply 1 Application topically as needed (scalp).   estradiol 0.1 mg/24hr patch Commonly known as: CLIMARA - Dosed in mg/24 hr Place 0.1 mg onto the skin once a week.   fluocinonide 0.05 % external solution Commonly known as: LIDEX Apply 1 application topically 2 (two) times daily as needed (for psorasis.).   gabapentin 400 MG capsule Commonly known as: NEURONTIN Take 1 capsule (400 mg total) by mouth at bedtime. What changed:  medication strength how much to take   guaiFENesin 600 MG 12 hr tablet Commonly known as: MUCINEX Take 1 tablet (600 mg total) by mouth 2 (two) times daily as needed.   hydroxychloroquine 200 MG tablet Commonly known as: PLAQUENIL Take 200 mg by mouth daily.   metoprolol succinate 25 MG 24 hr tablet Commonly known as: TOPROL-XL Take 0.5  tablets (12.5 mg total) by mouth daily.   oseltamivir 30 MG capsule Commonly known as: TAMIFLU Take 1 capsule (30 mg total) by mouth 2 (two) times daily for 2 days.   progesterone 100 MG capsule Commonly known as: PROMETRIUM Take 100 mg by mouth at bedtime.   rosuvastatin 5 MG tablet Commonly known as: CRESTOR Take 1 tablet (5 mg total) by mouth  daily.   Synthroid 50 MCG tablet Generic drug: levothyroxine Take 1 tablet (50 mcg total) by mouth every morning. What changed: See the new instructions.   TESTOSTERONE PROPIONATE IJ Place 1 application  onto the skin 3 (three) times a week.       Time spent: 36 minutes.  SignedBarnetta Chapel 05/07/2023, 11:54 AM

## 2023-05-07 NOTE — Plan of Care (Signed)

## 2023-05-07 NOTE — Evaluation (Addendum)
Occupational Therapy Evaluation Patient Details Name: [name] MRN: [id number] DOB: 08-26-1940 Today's Date: 05/07/2023   History of Present Illness Pt is an 82 y/o F presenting to ED on 12/10 with dry cough. Found to have influenza A. CTA chest without PE, CT head unremarkable for intracranial abnormality. CXR with new patchy densities in perihilar and medial R lower lung, concerning for PNA. 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 Pt currently performing ADLs without assist, ind with bed mobility and transfers/in room ambulation. Pt presenting with impairments listed below, however has no acute OT needs at this time, will s/o. Please reconsult if there is a change in pt status. Anticipate no OT follow up needs at d/c.        If plan is discharge home, recommend the following: Assistance with cooking/housework;Assist for transportation;Help with stairs or ramp for entrance;A little help with bathing/dressing/bathroom    Functional Status Assessment  Patient has had a recent decline in their functional status and demonstrates the ability to make significant improvements in function in a reasonable and predictable amount of time.  Equipment Recommendations  None recommended by OT    Recommendations for Other Services       Precautions / Restrictions Precautions Precautions: None Restrictions Weight Bearing Restrictions Per Provider Order: No      Mobility Bed Mobility Overal bed mobility: Independent                  Transfers Overall transfer level: Independent                        Balance Overall balance assessment: No apparent balance deficits (not formally assessed)                                          ADL either performed or assessed with clinical judgement   ADL Overall ADL's : At baseline                                       General ADL Comments: dons socks, performs toilet transfer, grooming task and simulated tub transfer without assist     Vision   Vision Assessment?: No apparent visual deficits     Perception Perception: Not tested       Praxis Praxis: Not tested       Pertinent Vitals/Pain Pain Assessment Pain Assessment: No/denies pain     Extremity/Trunk Assessment Upper Extremity Assessment Upper Extremity Assessment: Overall WFL for tasks assessed (arthritic changes to bil hands)   Lower Extremity Assessment Lower Extremity Assessment: Defer to PT evaluation   Cervical / Trunk Assessment Cervical / Trunk Assessment: Normal   Communication Communication Communication: No apparent difficulties   Cognition Arousal: Alert Behavior During Therapy: WFL for tasks assessed/performed Overall Cognitive Status: Within Functional Limits for tasks assessed                                       General Comments  VSS    Exercises  Shoulder Instructions      Home Living Family/patient expects to be discharged to:: Private residence Living Arrangements: Alone Available Help at Discharge: Family;Available PRN/intermittently Type of Home: House Home Access: Stairs to enter Entrance Stairs-Number of Steps: 1   Home Layout: Two level;Bed/bath upstairs Alternate Level Stairs-Number of Steps: flight Alternate Level Stairs-Rails: Can reach both Bathroom Shower/Tub: Tub/shower unit         Home Equipment: Grab bars - tub/shower;Rolling Walker (2 wheels);Cane - single point          Prior Functioning/Environment Prior Level of Function : Independent/Modified Independent;Driving             Mobility Comments: no AD use ADLs Comments: has a housekeeper, ind with ADLs/IADLs        OT Problem List:  Decreased activity tolerance      OT Treatment/Interventions:      OT Goals(Current goals can be found in the care plan section) Acute Rehab OT Goals Patient Stated Goal: none stated OT Goal Formulation: With patient Time For Goal Achievement: 05/21/23 Potential to Achieve Goals: Good  OT Frequency:      Co-evaluation              AM-PAC OT "6 Clicks" Daily Activity     Outcome Measure Help from another person eating meals?: None Help from another person taking care of personal grooming?: None Help from another person toileting, which includes using toliet, bedpan, or urinal?: None Help from another person bathing (including washing, rinsing, drying)?: A Little Help from another person to put on and taking off regular upper body clothing?: None Help from another person to put on and taking off regular lower body clothing?: None 6 Click Score: 23   End of Session Nurse Communication: Mobility status  Activity Tolerance: Patient tolerated treatment well Patient left: in bed;with call bell/phone within reach  OT Visit Diagnosis: Muscle weakness (generalized) (M62.81)                Time: 6045-4098 OT Time Calculation (min): 29 min Charges:  OT General Charges $OT Visit: 1 Visit OT Evaluation $OT Eval Low Complexity: 1 Low OT Treatments $Self Care/Home Management : 8-22 mins  [name], OTD, OTR/L [facility] Preferred Acute Rehab [phone]   [name] 05/07/2023, 12:45 PM

## 2023-05-07 NOTE — Progress Notes (Signed)
PT Cancellation Note  Patient Details Name: [name] MRN: [id number] DOB: 10-13-1940   Cancelled Treatment:    Reason Eval/Treat Not Completed: PT screened. Patient mobilizing independently during OT Evaluation; no needs identified, will sign off.   Ina Homes, PT, DPT Acute Rehabilitation Services  Personal: Secure Chat Rehab Office: [phone]  Malachy Chamber 05/07/2023, 10:34 AM

## 2023-05-10 DIAGNOSIS — R9389 Abnormal findings on diagnostic imaging of other specified body structures: Secondary | ICD-10-CM | POA: Diagnosis not present

## 2023-05-10 DIAGNOSIS — J159 Unspecified bacterial pneumonia: Secondary | ICD-10-CM | POA: Diagnosis not present

## 2023-05-10 DIAGNOSIS — R053 Chronic cough: Secondary | ICD-10-CM | POA: Diagnosis not present

## 2023-05-10 DIAGNOSIS — J101 Influenza due to other identified influenza virus with other respiratory manifestations: Secondary | ICD-10-CM | POA: Diagnosis not present

## 2023-05-10 DIAGNOSIS — R0989 Other specified symptoms and signs involving the circulatory and respiratory systems: Secondary | ICD-10-CM | POA: Diagnosis not present

## 2023-05-10 DIAGNOSIS — R062 Wheezing: Secondary | ICD-10-CM | POA: Diagnosis not present

## 2023-05-10 LAB — O&P RESULT

## 2023-05-10 LAB — OVA + PARASITE EXAM

## 2023-05-27 DIAGNOSIS — J984 Other disorders of lung: Secondary | ICD-10-CM | POA: Diagnosis not present

## 2023-05-27 DIAGNOSIS — R918 Other nonspecific abnormal finding of lung field: Secondary | ICD-10-CM | POA: Diagnosis not present

## 2023-05-27 DIAGNOSIS — R9389 Abnormal findings on diagnostic imaging of other specified body structures: Secondary | ICD-10-CM | POA: Diagnosis not present

## 2023-05-30 DIAGNOSIS — Z1231 Encounter for screening mammogram for malignant neoplasm of breast: Secondary | ICD-10-CM | POA: Diagnosis not present

## 2023-06-01 DIAGNOSIS — R053 Chronic cough: Secondary | ICD-10-CM | POA: Diagnosis not present

## 2023-06-01 DIAGNOSIS — Z8709 Personal history of other diseases of the respiratory system: Secondary | ICD-10-CM | POA: Diagnosis not present

## 2023-06-01 DIAGNOSIS — D649 Anemia, unspecified: Secondary | ICD-10-CM | POA: Diagnosis not present

## 2023-06-01 DIAGNOSIS — R944 Abnormal results of kidney function studies: Secondary | ICD-10-CM | POA: Diagnosis not present

## 2023-06-03 ENCOUNTER — Other Ambulatory Visit: Payer: Self-pay | Admitting: Family Medicine

## 2023-06-03 DIAGNOSIS — R9389 Abnormal findings on diagnostic imaging of other specified body structures: Secondary | ICD-10-CM

## 2023-06-03 DIAGNOSIS — R918 Other nonspecific abnormal finding of lung field: Secondary | ICD-10-CM

## 2023-06-16 ENCOUNTER — Ambulatory Visit
Admission: RE | Admit: 2023-06-16 | Discharge: 2023-06-16 | Disposition: A | Discharge status: Home / Self Care | Payer: Medicare PPO | Source: Ambulatory Visit | Attending: Family Medicine | Admitting: Family Medicine

## 2023-06-16 DIAGNOSIS — J984 Other disorders of lung: Secondary | ICD-10-CM | POA: Diagnosis not present

## 2023-06-16 DIAGNOSIS — R9389 Abnormal findings on diagnostic imaging of other specified body structures: Secondary | ICD-10-CM

## 2023-06-16 DIAGNOSIS — R918 Other nonspecific abnormal finding of lung field: Secondary | ICD-10-CM

## 2023-06-16 DIAGNOSIS — I2510 Atherosclerotic heart disease of native coronary artery without angina pectoris: Secondary | ICD-10-CM | POA: Diagnosis not present

## 2023-06-22 DIAGNOSIS — Z23 Encounter for immunization: Secondary | ICD-10-CM | POA: Diagnosis not present

## 2023-06-27 DIAGNOSIS — H26493 Other secondary cataract, bilateral: Secondary | ICD-10-CM | POA: Diagnosis not present

## 2023-06-27 DIAGNOSIS — H353121 Nonexudative age-related macular degeneration, left eye, early dry stage: Secondary | ICD-10-CM | POA: Diagnosis not present

## 2023-06-27 DIAGNOSIS — H04123 Dry eye syndrome of bilateral lacrimal glands: Secondary | ICD-10-CM | POA: Diagnosis not present

## 2023-06-27 DIAGNOSIS — Z79899 Other long term (current) drug therapy: Secondary | ICD-10-CM | POA: Diagnosis not present

## 2023-06-27 DIAGNOSIS — D3131 Benign neoplasm of right choroid: Secondary | ICD-10-CM | POA: Diagnosis not present

## 2023-06-27 DIAGNOSIS — H524 Presbyopia: Secondary | ICD-10-CM | POA: Diagnosis not present

## 2023-07-18 DIAGNOSIS — R399 Unspecified symptoms and signs involving the genitourinary system: Secondary | ICD-10-CM | POA: Diagnosis not present

## 2023-07-18 DIAGNOSIS — Z6821 Body mass index (BMI) 21.0-21.9, adult: Secondary | ICD-10-CM | POA: Diagnosis not present

## 2023-07-20 DIAGNOSIS — L820 Inflamed seborrheic keratosis: Secondary | ICD-10-CM | POA: Diagnosis not present

## 2023-07-20 DIAGNOSIS — L821 Other seborrheic keratosis: Secondary | ICD-10-CM | POA: Diagnosis not present

## 2023-08-05 DIAGNOSIS — M150 Primary generalized (osteo)arthritis: Secondary | ICD-10-CM | POA: Diagnosis not present

## 2023-08-05 DIAGNOSIS — Z79899 Other long term (current) drug therapy: Secondary | ICD-10-CM | POA: Diagnosis not present

## 2023-08-05 DIAGNOSIS — G629 Polyneuropathy, unspecified: Secondary | ICD-10-CM | POA: Diagnosis not present

## 2023-08-05 DIAGNOSIS — D649 Anemia, unspecified: Secondary | ICD-10-CM | POA: Diagnosis not present

## 2023-08-05 DIAGNOSIS — D696 Thrombocytopenia, unspecified: Secondary | ICD-10-CM | POA: Diagnosis not present

## 2023-08-05 DIAGNOSIS — I7300 Raynaud's syndrome without gangrene: Secondary | ICD-10-CM | POA: Diagnosis not present

## 2023-08-05 DIAGNOSIS — L4050 Arthropathic psoriasis, unspecified: Secondary | ICD-10-CM | POA: Diagnosis not present

## 2023-08-09 ENCOUNTER — Encounter: Payer: Self-pay | Admitting: Pulmonary Disease

## 2023-08-09 ENCOUNTER — Institutional Professional Consult (permissible substitution): Payer: Medicare PPO | Admitting: Internal Medicine

## 2023-08-09 ENCOUNTER — Ambulatory Visit: Payer: Medicare PPO | Admitting: Pulmonary Disease

## 2023-08-09 VITALS — BP 131/74 | HR 66 | Ht 65.5 in | Wt 135.2 lb

## 2023-08-09 DIAGNOSIS — Z8701 Personal history of pneumonia (recurrent): Secondary | ICD-10-CM | POA: Diagnosis not present

## 2023-08-09 DIAGNOSIS — J219 Acute bronchiolitis, unspecified: Secondary | ICD-10-CM

## 2023-08-09 NOTE — Progress Notes (Signed)
 @Patient  ID: [name], female    DOB: March 26, 1941, 83 y.o.   MRN: [id number]  Chief Complaint  Patient presents with   Consult    Pt state had flu in 12/24 hospitalized after that they did follow up Ct that showed bronch. She has some SOB every now and again     Referring provider: Merri Brunette, MD  HPI:   83 y.o. woman with prior history of recurrent pneumonias and bronchitis but relatively symptom-free for 3+ decades whom are seen for evaluation of CT abnormality.  Note from referring provider reviewed.  Multiple hospital notes 04/2023 reviewed.  Patient became ill 04/2023.  Following a viral illness fever etc.  Presented to the ED.  Gradually got worse.  Was admitted.  Chest x-ray revealed bilateral lung field infiltrates concerning for multifocal pneumonia.  She tested positive for flu.  She was treated with antibiotics.  Likely was a viral pneumonia.  Repeat CT scan 4 days later which showed persistence of infiltrate.  Not unsurprising given only few days from initial chest x-ray.  Her symptoms gradually improved.  She did not have prolonged exacerbation of bronchitis etc. which has been a problem decades ago.  Cough improved at [facility].  Gradually her shortness of breath is improved.  She still had some residual symptoms which prompted CT scan to follow-up the pneumonia 05/2022.  This is personally reviewed With clear lungs bilaterally, prior infiltrate not seen..  The read mentions bronchiolitis.  She was concerned about bronchiectasis.  We discussed with respect to the findings on her imaging.  Laretta Bolster looks pretty similar to prior CT coronary 2022 on my review and interpretation.  She is back to walking.  No real exertional Limitation.  No cough etc.  We discussed at length the reassuring signs of improvement after her hospitalization as well as the radiologic resolution of prior pneumonia which is reassuring.   Questionaires / Pulmonary Flowsheets:   ACT:      No data to  display          MMRC:     No data to display          Epworth:      No data to display          Tests:   FENO:  No results found for: "NITRICOXIDE"  PFT:     No data to display          WALK:      No data to display          Imaging: Personally reviewed and as per EMR and discussion in this note No results found.  Lab Results: Personally reviewed CBC    Component Value Date/Time   WBC 7.2 05/06/2023 0740   RBC 3.51 (L) 05/06/2023 0740   HGB 11.6 (L) 05/06/2023 0740   HCT 34.8 (L) 05/06/2023 0740   PLT 101 (L) 05/06/2023 0740   MCV 99.1 05/06/2023 0740   MCH 33.0 05/06/2023 0740   MCHC 33.3 05/06/2023 0740   RDW 13.0 05/06/2023 0740   LYMPHSABS 0.4 (L) 05/03/2023 1641   MONOABS 0.4 05/03/2023 1641   EOSABS 0.0 05/03/2023 1641   BASOSABS 0.0 05/03/2023 1641    BMET    Component Value Date/Time   NA 139 05/07/2023 0922   K 3.8 05/07/2023 0922   CL 104 05/07/2023 0922   CO2 25 05/07/2023 0922   GLUCOSE 105 (H) 05/07/2023 0922   BUN 11 05/07/2023 0922   CREATININE 1.01 (H) 05/07/2023  0865   CALCIUM 8.2 (L) 05/07/2023 0922   GFRNONAA 56 (L) 05/07/2023 0922   GFRAA >60 09/22/2017 1343    BNP No results found for: "BNP"  ProBNP No results found for: "PROBNP"  Specialty Problems   None   Allergies  Allergen Reactions   Imitrex [Sumatriptan] Nausea And Vomiting and Hypertension   Nsaids Other (See Comments)    Gerd as result taking.   Ciprofloxacin Hcl    Keflex [Cephalexin] Nausea And Vomiting    Immunization History  Administered Date(s) Administered   Fluad Quad(high Dose 65+) 03/18/2022   Influenza,inj,quad, With Preservative 01/22/2017   Influenza-Unspecified 02/23/2016, 01/22/2017   PFIZER Comirnaty(Gray Top)Covid-19 Tri-Sucrose Vaccine 04/02/2022   PFIZER(Purple Top)SARS-COV-2 Vaccination 06/29/2019, 07/19/2019, 12/24/2019   Pneumococcal Conjugate-13 02/03/2016   Unspecified SARS-COV-2 Vaccination 06/29/2019,  07/20/2019    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HCC)     dr Vincente Poli, [facility] B now resolved   Anemia    iron   Arthritis    psoriatic  in hips oa in hands   Blood transfusion without reported diagnosis 1978   Cataract    chronic headaches    histamine migraines, rarely occur   Chronic kidney disease    hx bladder infections yrs ago   Colon polyp yrs ago   Dysrhythmia 1996 to 1999   wore holter monitor result of accidental fall, occasional irregular pulse   Family history of adverse reaction to anesthesia    father had aspiration after anesthesia one time   Gastric polyps    GERD (gastroesophageal reflux disease)    History of migraine headaches    Hyperlipidemia    Hypothyroidism    Neuromuscular disease (HCC)    fibromyalgia   Pneumonia 2005aspiration pneumonia   stopped when got pneumonia shot, also 2015 regular pneumonia   PONV (postoperative nausea and vomiting)    patient wishes to have no anesthetics that could affect memory due to past memory issues, no cause found   Psoriatic arthritis (HCC)    PVC (premature ventricular contraction)     Tobacco History: Social History   Tobacco Use  Smoking Status Never  Smokeless Tobacco Never   Counseling given: Not Answered   Continue to not smoke  Outpatient Encounter Medications as of 08/09/2023  Medication Sig   clobetasol (TEMOVATE) 0.05 % external solution Apply 1 Application topically as needed (scalp).   estradiol (CLIMARA - DOSED IN MG/24 HR) 0.1 mg/24hr patch Place 0.1 mg onto the skin once a week.   fluocinonide (LIDEX) 0.05 % external solution Apply 1 application topically 2 (two) times daily as needed (for psorasis.).    gabapentin (NEURONTIN) 400 MG capsule Take 1 capsule (400 mg total) by mouth at bedtime.   hydroxychloroquine (PLAQUENIL) 200 MG tablet Take 200 mg by mouth daily.   metoprolol succinate (TOPROL-XL) 25 MG 24 hr tablet Take 0.5 tablets (12.5 mg total) by  mouth daily.   progesterone (PROMETRIUM) 100 MG capsule Take 100 mg by mouth at bedtime.   rosuvastatin (CRESTOR) 5 MG tablet Take 1 tablet (5 mg total) by mouth daily.   SYNTHROID 50 MCG tablet Take 1 tablet (50 mcg total) by mouth every morning. (Patient taking differently: Take 50 mcg by mouth daily before breakfast.)   TESTOSTERONE PROPIONATE IJ Place 1 application  onto the skin 3 (three) times a week.   acetaminophen (TYLENOL) 325 MG tablet Take 2 tablets (650 mg total) by mouth every 6 (six) hours as needed for  mild pain (pain score 1-3) (or Fever >/= 101).   guaiFENesin (MUCINEX) 600 MG 12 hr tablet Take 1 tablet (600 mg total) by mouth 2 (two) times daily as needed.   No facility-administered encounter medications on file as of 08/09/2023.     Review of Systems  Review of Systems  No chest pain exertion.  No orthopnea or PND.  Comprehensive review of systems otherwise negative. Physical Exam  BP 131/74 (BP Location: Left Arm, Cuff Size: Normal)   Pulse 66   Ht 5' 5.5" (1.664 m)   Wt 135 lb 3.2 oz (61.3 kg)   SpO2 97%   BMI 22.16 kg/m   Wt Readings from Last 5 Encounters:  08/09/23 135 lb 3.2 oz (61.3 kg)  05/03/23 130 lb 1.1 oz (59 kg)  03/29/23 132 lb 6 oz (60 kg)  12/24/22 131 lb (59.4 kg)  10/21/22 130 lb (59 kg)    BMI Readings from Last 5 Encounters:  08/09/23 22.16 kg/m  05/04/23 21.32 kg/m  03/29/23 22.37 kg/m  12/24/22 21.47 kg/m  10/21/22 21.30 kg/m     Physical Exam General: Sitting in chair, no acute distress Eyes: EOMI, no icterus Neck: Supple, no JVP Pulmonary: Clear, normal work of breathing Cardiovascular: Warm, no edema, regular rate and rhythm Abdomen: Nondistended, bowel is present MSK: No synovitis, no joint effusion Neuro: Normal gait, no weakness Psych: Normal mood, flat affect   Assessment & Plan:   Bronchiolitis: On CT scan 05/2023.  Very mild.  She was having some residual symptoms.  These cleared up.  No further imaging or  follow-up needed.  Do query underlying asthma but really no day-to-day symptoms of historically done well without bronchitis etc. in the past.  Recently pneumonia without significant bronchitis clinically.  History of community-acquired pneumonia, likely influenza pneumonia.  Was treated with antibiotics.  Improved on serial chest images.  Resolved.  No further follow-up needed.   Return if symptoms worsen or fail to improve.   Karren Burly, MD 08/09/2023   This appointment required 45 minutes of patient care (this includes precharting, chart review, review of results, face-to-face care, etc.).

## 2023-08-09 NOTE — Patient Instructions (Signed)
 The chest imaging shows the pneumonia is gone away  Your symptoms are improving  The combination of your symptoms and the improving chest imaging is very reassuring that the prior pneumonia has healed well  Return to clinic as needed

## 2023-09-08 DIAGNOSIS — Z85828 Personal history of other malignant neoplasm of skin: Secondary | ICD-10-CM | POA: Diagnosis not present

## 2023-09-08 DIAGNOSIS — L400 Psoriasis vulgaris: Secondary | ICD-10-CM | POA: Diagnosis not present

## 2023-09-08 DIAGNOSIS — L821 Other seborrheic keratosis: Secondary | ICD-10-CM | POA: Diagnosis not present

## 2023-09-08 DIAGNOSIS — D225 Melanocytic nevi of trunk: Secondary | ICD-10-CM | POA: Diagnosis not present

## 2023-10-21 ENCOUNTER — Encounter: Payer: Self-pay | Admitting: Cardiology

## 2023-10-21 ENCOUNTER — Ambulatory Visit: Attending: Cardiology | Admitting: Cardiology

## 2023-10-21 VITALS — BP 117/69 | HR 64 | Ht 65.5 in | Wt 131.0 lb

## 2023-10-21 DIAGNOSIS — I493 Ventricular premature depolarization: Secondary | ICD-10-CM | POA: Diagnosis not present

## 2023-10-21 DIAGNOSIS — I472 Ventricular tachycardia, unspecified: Secondary | ICD-10-CM | POA: Diagnosis not present

## 2023-10-21 NOTE — Progress Notes (Signed)
  Electrophysiology Office Follow up Visit Note:    Date:  10/21/2023   ID:  [name], DOB 06-17-1940, MRN [id number]  PCP:  Faustina Hood, MD  [facility] Cardiologist:  None  [facility] Electrophysiologist:  Boyce Byes, MD    Interval History:     [name] is a 83 y.o. female who presents for a follow up visit.   The patient was most recently seen by EP on Oct 21, 2022 with Jonelle Neri.  At that appointment she reported rare palpitations.  She previously tried amiodarone  for her PVCs and NSVT but did not tolerate the medication.  She uses metoprolol  to help control her symptoms.  She is doing well today.  She does report some dyspnea with exertion that is not limiting.  She was hospitalized back in December for severe flu for several days.  She thinks that worsened her breathing.  Despite this complaint, she is able to be active working in her yard and going on walks.  She continues to take metoprolol .      Past medical, surgical, social and family history were reviewed.  ROS:   Please see the history of present illness.    All other systems reviewed and are negative.  EKGs/Labs/Other Studies Reviewed:    The following studies were reviewed today:  May 04, 2023 ECG shows sinus rhythm with rare PVCs   EKG Interpretation Date/Time:  Friday Oct 21 2023 14:17:41 EDT Ventricular Rate:  64 PR Interval:  114 QRS Duration:  98 QT Interval:  400 QTC Calculation: 412 R Axis:   43  Text Interpretation: Sinus rhythm with occasional Premature ventricular complexes Confirmed by Harvie Liner [phone]) on 10/21/2023 2:30:52 PM    Physical Exam:    VS:  BP 117/69 (BP Location: Right Arm, Patient Position: Sitting, Cuff Size: Normal)   Pulse 64   Ht 5' 5.5" (1.664 m)   Wt 131 lb (59.4 kg)   SpO2 95%   BMI 21.47 kg/m     Wt Readings from Last 3 Encounters:  10/21/23 131 lb (59.4 kg)  08/09/23 135 lb 3.2 oz (61.3 kg)  05/03/23 130 lb 1.1 oz  (59 kg)     GEN: no distress CARD: RRR, No MRG RESP: No IWOB. CTAB.      ASSESSMENT:    1. Frequent PVCs   2. VT (ventricular tachycardia) (HCC)    PLAN:    In order of problems listed above:  #PVCs Doing well metoprolol  succinate 12.5 mg by mouth once daily.  I recommend continuing this for now.  No indication for antiarrhythmic drugs or invasive treatment at this time.  I would recommend continuing with routine follow-up in primary care and general cardiology.    Follow-up 1 year with EP APP   Signed, Harvie Liner, MD, [facility], [facility] 10/21/2023 2:31 PM    Electrophysiology [facility]

## 2023-10-21 NOTE — Patient Instructions (Signed)
 Medication Instructions:  Your physician recommends that you continue on your current medications as directed. Please refer to the Current Medication list given to you today.  *If you need a refill on your cardiac medications before your next appointment, please call your pharmacy*   Follow-Up: At [facility], you and your health needs are our priority.  As part of our continuing mission to provide you with exceptional heart care, our providers are all part of one team.  This team includes your primary Cardiologist (physician) and Advanced Practice Providers or APPs (Physician Assistants and Nurse Practitioners) who all work together to provide you with the care you need, when you need it.  Your next appointment:   1 year(s)  Provider:   You may see Boyce Byes, MD or one of the following Advanced Practice Providers on your designated Care Team:   Mertha Abrahams, [place] [address] [place], PA-C Suzann Riddle, NP Creighton Doffing, NP

## 2023-11-19 DIAGNOSIS — H6983 Other specified disorders of Eustachian tube, bilateral: Secondary | ICD-10-CM | POA: Diagnosis not present

## 2023-12-02 ENCOUNTER — Telehealth: Payer: Self-pay | Admitting: Cardiovascular Disease

## 2023-12-02 NOTE — Telephone Encounter (Signed)
 Left message and call back number for the patient

## 2023-12-02 NOTE — Telephone Encounter (Signed)
 She reports that she has never seen swelling this bad- she had a 10 day trip to [place]- she had a normal amount of swelling at first- then it gradually got worse. Does not go down all the way over night.   Right foot- puffy on top and sides, Right part of it is a reddish/purple color due to swelling;  ankles are swollen and are not quite as puffy as they were last night. Her leg- mild swollen, tight. She did elevate feet on and off today.  A little tender to the touch on top of foot, but not tender anywhere else.   Left foot- just as puffy and swollen as the right- but no discoloration today- there was last night, but not today. Ankles- swollen just like the right. The left calf is tender/sore- after having a cramp yesterday morning, but feels that is the only reason it is sore.   She was last on a plane on Wednesday.   She reports that swelling goes down some overnight, then it comes back/swelling when she is active- but it has never gone back to normal since traveling.   Informed her that she should go to an Urgent Care so they can assess your legs and rule out dvt, due to recent travel. She reports that she does not feel that it is a dvt; since both legs are swollen- that if the swelling is not much better by tomorrow, then she will go to the urgent care to be seen.   Discussed using compression socks during the day and taking them off for sleep. Elevating legs intermittently and watching sodium intake. Made yearly f/u appt with Dr Court for 12/21/23. Recommended going to the Urgent Care for assessment. She verbalized understanding.

## 2023-12-02 NOTE — Telephone Encounter (Signed)
 Pt returning call

## 2023-12-02 NOTE — Telephone Encounter (Signed)
 Pt c/o swelling: STAT is pt has developed SOB within 24 hours  How much weight have you gained and in what time span? 3 lbs over the 10 days   If swelling, where is the swelling located? Both feet and ankles.   Are you currently taking a fluid pill? No   Are you currently SOB? No but pt did when she was hiking  Do you have a log of your daily weights (if so, list)? No  Have you gained 3 pounds in a day or 5 pounds in a week? Unsure   Have you traveled recently? Yes, went to [facility].

## 2023-12-05 NOTE — Telephone Encounter (Signed)
 Appointment made with Dr. Ganji for DOD on 12/09/23.

## 2023-12-05 NOTE — Telephone Encounter (Signed)
 Left message to call back

## 2023-12-09 ENCOUNTER — Encounter: Payer: Self-pay | Admitting: Cardiology

## 2023-12-09 ENCOUNTER — Other Ambulatory Visit (HOSPITAL_COMMUNITY): Payer: Self-pay

## 2023-12-09 ENCOUNTER — Ambulatory Visit: Attending: Cardiology | Admitting: Cardiology

## 2023-12-09 VITALS — BP 142/74 | HR 69 | Ht 65.5 in | Wt 133.0 lb

## 2023-12-09 DIAGNOSIS — T7029XD Other effects of high altitude, subsequent encounter: Secondary | ICD-10-CM | POA: Diagnosis not present

## 2023-12-09 DIAGNOSIS — T7029XS Other effects of high altitude, sequela: Secondary | ICD-10-CM

## 2023-12-09 DIAGNOSIS — I493 Ventricular premature depolarization: Secondary | ICD-10-CM | POA: Diagnosis not present

## 2023-12-09 DIAGNOSIS — R600 Localized edema: Secondary | ICD-10-CM | POA: Diagnosis not present

## 2023-12-09 MED ORDER — NADOLOL 20 MG PO TABS
20.0000 mg | ORAL_TABLET | Freq: Every day | ORAL | 1 refills | Status: DC
  Filled 2023-12-09: qty 30, 30d supply, fill #0

## 2023-12-09 NOTE — Patient Instructions (Addendum)
 Medication Instructions:  Your physician has recommended you make the following change in your medication: Stop metoprolol  Start Nadolol 20 mg by mouth daily  *If you need a refill on your cardiac medications before your next appointment, please call your pharmacy*  Lab Work: none If you have labs (blood work) drawn today and your tests are completely normal, you will receive your results only by: MyChart Message (if you have MyChart) OR A paper copy in the mail If you have any lab test that is abnormal or we need to change your treatment, we will call you to review the results.  Testing/Procedures: Your physician has requested that you have an echocardiogram. Echocardiography is a painless test that uses sound waves to create images of your heart. It provides your doctor with information about the size and shape of your heart and how well your heart's chambers and valves are working. This procedure takes approximately one hour. There are no restrictions for this procedure. Please do NOT wear cologne, perfume, aftershave, or lotions (deodorant is allowed). Please arrive 15 minutes prior to your appointment time.  Please note: We ask at that you not bring children with you during ultrasound (echo/ vascular) testing. Due to room size and safety concerns, children are not allowed in the ultrasound rooms during exams. Our front office staff cannot provide observation of children in our lobby area while testing is being conducted. An adult accompanying a patient to their appointment will only be allowed in the ultrasound room at the discretion of the ultrasound technician under special circumstances. We apologize for any inconvenience.   Follow-Up: At [facility], you and your health needs are our priority.  As part of our continuing mission to provide you with exceptional heart care, our providers are all part of one team.  This team includes your primary Cardiologist (physician) and  Advanced Practice Providers or APPs (Physician Assistants and Nurse Practitioners) who all work together to provide you with the care you need, when you need it.  Your next appointment:   July 30  Provider:   Dr Court    We recommend signing up for the patient portal called MyChart.  Sign up information is provided on this After Visit Summary.  MyChart is used to connect with patients for Virtual Visits (Telemedicine).  Patients are able to view lab/test results, encounter notes, upcoming appointments, etc.  Non-urgent messages can be sent to your provider as well.   To learn more about what you can do with MyChart, go to [email or web address].   Other Instructions

## 2023-12-09 NOTE — Progress Notes (Signed)
 Cardiology Office Note:  .   Date:  12/10/2023  ID:  [name], DOB 1941-05-19, MRN [id number] PCP: Claudene Pellet, MD  [facility] Providers Cardiologist:  Dorn Lesches, MD Electrophysiologist:  OLE ONEIDA HOLTS, MD   History of Present Illness: Cheryl   Kordelia Severin [name] is a 83 y.o. Caucasian female patient with history of PVCs and NSVT presently on low-dose metoprolol  although caused her to have mild fatigue, repeat event monitor revealed no further NSVT episodes and occasional PVCs.  She has had normal echocardiogram on 11/13/2020 and nuclear stress test was nonischemic.  She called our office stating that after a trip to [facility] for a 10-day trip following which she developed leg edema and also shortness of breath.  Upon arrival back to [facility], she has made another trip to the beach and just returned 2 days ago, wound the last 1 week, leg edema has essentially resolved and shortness of breath has improved as well.  Admits to eating salty food.  Discussed the use of AI scribe software for clinical note transcription with the patient, who gave verbal consent to proceed.  History of Present Illness [name] is an 83 year old female with a history of asymptomatic NSVT noted on event monitor and symptomatic PVCs who presents with shortness of breath and leg swelling. She experiences shortness of breath, which worsened at higher altitudes during a recent trip but persists with routine activities at home. Leg swelling was significant upon returning from her trip, with discoloration, particularly on the right side. Swelling has improved but persists with activity. Cramping and tingling in her legs began before the trip. Her blood pressure is elevated at 142/74, higher than her usual range of 112-120 systolic over 60s diastolic, with a similar elevation only once in the past five years, except during a hospitalization in December.  She takes Plaquenil  and  sulfasalazine for psoriatic arthritis, which is stable. She takes metoprolol  for PVCs, reduced to 12.5 mg BID mg due to sleep disturbances at higher doses. She follows a low sodium diet but consumed more sodium than usual during her trip.  Labs   Lab Results  Component Value Date   CHOL 158 03/18/2022   HDL 63.60 03/18/2022   LDLCALC 77 03/18/2022   TRIG 87.0 03/18/2022   CHOLHDL 2 03/18/2022   No results found for: LIPOA  Lab Results  Component Value Date   NA 139 05/07/2023   K 3.8 05/07/2023   CO2 25 05/07/2023   GLUCOSE 105 (H) 05/07/2023   BUN 11 05/07/2023   CREATININE 1.01 (H) 05/07/2023   CALCIUM  8.2 (L) 05/07/2023   GFR 83.40 03/05/2021   GFRNONAA 56 (L) 05/07/2023      Latest Ref Rng & Units 05/07/2023    9:22 AM 05/06/2023    7:40 AM 05/04/2023    6:28 AM  BMP  Glucose 70 - 99 mg/dL 894  897  91   BUN 8 - 23 mg/dL 11  13  5    Creatinine 0.44 - 1.00 mg/dL 8.98  9.06  9.42   Sodium 135 - 145 mmol/L 139  138  136   Potassium 3.5 - 5.1 mmol/L 3.8  3.5  3.3   Chloride 98 - 111 mmol/L 104  106  110   CO2 22 - 32 mmol/L 25  24  20    Calcium  8.9 - 10.3 mg/dL 8.2  8.0  7.7       Latest Ref Rng & Units 05/06/2023  7:40 AM 05/04/2023    6:28 AM 05/03/2023    4:41 PM  CBC  WBC 4.0 - 10.5 K/uL 7.2  4.8  6.3   Hemoglobin 12.0 - 15.0 g/dL 88.3  88.3  87.1   Hematocrit 36.0 - 46.0 % 34.8  36.1  40.1   Platelets 150 - 400 K/uL 101  102  106    No results found for: HGBA1C  Lab Results  Component Value Date   TSH 0.698 05/03/2023    ROS  Review of Systems  Cardiovascular:  Positive for dyspnea on exertion (minimal residual dyspnea) and leg swelling (essentially resolved). Negative for chest pain.   Physical Exam:   VS:  BP (!) 142/74 (BP Location: Left Arm, Patient Position: Sitting)   Pulse 69   Ht 5' 5.5 (1.664 m)   Wt 133 lb (60.3 kg)   SpO2 98%   BMI 21.80 kg/m    Wt Readings from Last 3 Encounters:  12/09/23 133 lb (60.3 kg)  10/21/23 131 lb  (59.4 kg)  08/09/23 135 lb 3.2 oz (61.3 kg)    Physical Exam Neck:     Vascular: No carotid bruit or JVD.  Cardiovascular:     Rate and Rhythm: Normal rate and regular rhythm. Frequent Extrasystoles are present.    Pulses: Intact distal pulses.     Heart sounds: Normal heart sounds. No murmur heard.    No gallop.  Pulmonary:     Effort: Pulmonary effort is normal.     Breath sounds: Normal breath sounds.  Abdominal:     General: Bowel sounds are normal.     Palpations: Abdomen is soft.  Musculoskeletal:     Right lower leg: No edema.     Left lower leg: No edema.    Studies Reviewed: Cheryl     EKG:    EKG Interpretation Date/Time:  Friday December 09 2023 13:41:54 EDT Ventricular Rate:  69 PR Interval:  112 QRS Duration:  96 QT Interval:  392 QTC Calculation: 420 R Axis:   2  Text Interpretation: EKG 12/09/2023: Normal sinus rhythm at rate of 69 bpm, normal axis, poor R progression, probably normal variant.  Borderline criteria for LVH by Cornell criteria (or in aVL and S in V3) no evidence of ischemia.  Normal QT interval.  Compared to 10/21/2023, PVCs (2) not present. Confirmed by Kazuo Durnil, Jagadeesh ([id number]) on 12/10/2023 8:39:13 AM    Medications ordered    Meds ordered this encounter  Medications   nadolol  (CORGARD ) 20 MG tablet    Sig: Take 1 tablet (20 mg total) by mouth daily.    Dispense:  30 tablet    Refill:  1     ASSESSMENT AND PLAN: .      ICD-10-CM   1. Bilateral leg edema  R60.0 EKG 12-Lead    2. Shortness of breath associated with high altitude, sequela  T70.29XS ECHOCARDIOGRAM COMPLETE    3. PVC (premature ventricular contraction)  I49.3 nadolol  (CORGARD ) 20 MG tablet    ECHOCARDIOGRAM COMPLETE     Assessment & Plan  Bilateral leg edema Bilateral leg edema during travel, likely due to high altitude, dietary changes, and mild heart failure exacerbation. Edema improved since returning home, expected to resolve in 1-2 weeks. - Advise use of compression  stockings during future travel to prevent edema. - Monitor for resolution of edema over the next 1-2 weeks. - No suspicion for PE, edema was bilateral.  Physical exam not suggestive of DVT.  Shortness of  breath Shortness of breath during high-altitude travel, persists mildly at home. Likely related to PVCs and travel stress. Not indicative of a serious condition like pulmonary embolism. - Monitor shortness of breath in conjunction with nadolol  trial for PVCs. - Will obtain repeat echocardiogram  PVCs PVCs managed with metoprolol , intolerant to amiodarone . Current metoprolol  dose is 12.5 mg BID due to sleep disturbances at higher doses. PVCs more frequent, possibly due to stress and travel. - Prescribe nadolol  20 mg once daily as a trial for PVCs. - Provide a 30-day supply with one refill. - Instruct to switch from metoprolol  to nadolol  and monitor response. - Consider increasing nadolol  to twice daily if well-tolerated. - Revert to metoprolol  if nadolol  is ineffective.  Hypertension Blood pressure elevated at 142/74 mmHg, atypical for her, attributed to stress and travel. Nadolol  may help manage blood pressure. - Monitor blood pressure in conjunction with nadolol  trial. - Consider increasing nadolol  to twice daily if blood pressure remains elevated.  Keep follow-up appointment with Dr. Dorn Lesches   Signed,  Gordy Bergamo, MD, [facility] 12/10/2023, 8:39 AM [facility] [address] [place], [place] [address] Phone: [phone]. Fax:  [phone]

## 2023-12-21 ENCOUNTER — Encounter: Payer: Self-pay | Admitting: Cardiovascular Disease

## 2023-12-21 ENCOUNTER — Ambulatory Visit: Attending: Cardiology | Admitting: Cardiovascular Disease

## 2023-12-21 VITALS — BP 124/66 | HR 64 | Ht 65.5 in | Wt 132.0 lb

## 2023-12-21 DIAGNOSIS — I493 Ventricular premature depolarization: Secondary | ICD-10-CM | POA: Diagnosis not present

## 2023-12-21 DIAGNOSIS — I10 Essential (primary) hypertension: Secondary | ICD-10-CM | POA: Diagnosis not present

## 2023-12-21 DIAGNOSIS — E782 Mixed hyperlipidemia: Secondary | ICD-10-CM

## 2023-12-21 DIAGNOSIS — R600 Localized edema: Secondary | ICD-10-CM | POA: Diagnosis not present

## 2023-12-21 DIAGNOSIS — R931 Abnormal findings on diagnostic imaging of heart and coronary circulation: Secondary | ICD-10-CM

## 2023-12-21 NOTE — Assessment & Plan Note (Signed)
 History of essential hypertension with blood pressure measured today 124/66.  She is on metoprolol .

## 2023-12-21 NOTE — Assessment & Plan Note (Signed)
 Patient felt bilateral lower extreme edema when she was out at the [place] with her [place]daughter earlier this month.  This was associated with some increasing shortness of breath.  This has since resolved spontaneously.

## 2023-12-21 NOTE — Assessment & Plan Note (Signed)
 History of elevated coronary calcium  score of 14.  She is on statin therapy for risk factor optimization.  She denies chest pain.

## 2023-12-21 NOTE — Progress Notes (Signed)
 12/21/2023 [name]   15-Oct-1940  [id number]  Primary Physician Claudene Pellet, MD Primary Cardiologist: Dorn JINNY Lesches MD [facility] [place], [place]  HPI:  [name] is a 83 y.o.   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 Mat , her OB/GYN, for progressive dyspnea.  I last saw her in the office 12/24/2022.  Her only risk factor history of hyperlipidemia.  She is never smoked.  There is no family history.  She is never had a heart attack or stroke.  She is had a right total hip replacement and has difficulties with her left hip scheduled to see Dr. Melodi in the near future.  Since January he has had progressive dyspnea.  She did recently have COVID and was treated with Paxlovid  as an outpatient.  I obtained a 2D echocardiogram on her 11/13/2020 which was normal.  A Myoview  stress test showed no ischemia but that showed nonsustained ventricular tachycardia.  A coronary calcium  score was low at 14.  I did refer her to Dr. Cindie, electrophysiology, who empirically begun her on low-dose amiodarone  which she did not tolerate.  She is currently on metoprolol  25 mg a day which she says is affecting her sleep adversely as well as causing fatigue.  A follow-up event monitor performed by Dr. Cindie 06/19/2021 showed only rare PVCs.   Since I saw her a year ago she is remained stable.  [place] with her 56 year old granddaughter and developed bilateral lower extreme edema and some dyspnea.  She saw Dr. Ladona in the office 12/10/2023 who switched her metoprolol  to nadolol  which she did not tolerate.  Her edema has since resolved spontaneously.  She is currently asymptomatic.   Current Meds  Medication Sig   clobetasol (TEMOVATE) 0.05 % external solution Apply 1 Application topically as  needed (scalp).   estradiol  (CLIMARA  - DOSED IN MG/24 HR) 0.1 mg/24hr patch Place 0.1 mg onto the skin once a week.   fluocinonide (LIDEX) 0.05 % external solution Apply 1 application topically 2 (two) times daily as needed (for psorasis.).    gabapentin  (NEURONTIN ) 400 MG capsule Take 1 capsule (400 mg total) by mouth at bedtime.   hydroxychloroquine  (PLAQUENIL ) 200 MG tablet Take 200 mg by mouth daily.   metoprolol  succinate (TOPROL -XL) 25 MG 24 hr tablet Take 25 mg by mouth daily. (Patient taking differently: Take 25 mg by mouth daily. Patient takes 12.5 mg)   progesterone  (PROMETRIUM ) 100 MG capsule Take 100 mg by mouth at bedtime.   rosuvastatin  (CRESTOR ) 5 MG tablet Take 1 tablet (5 mg total) by mouth daily.   sulfaSALAzine (AZULFIDINE) 500 MG EC tablet Take 500 mg by mouth daily. (Patient taking differently: Take 400 mg by mouth daily. 1 tablet in the morning and 3 at night)   SYNTHROID  50 MCG tablet Take 1 tablet (50 mcg total) by mouth every morning. (Patient taking differently: Take 50 mcg by mouth daily before breakfast.)   TESTOSTERONE  PROPIONATE IJ Place 1 application  onto the skin 3 (three) times a week.     Allergies  Allergen Reactions   Imitrex [Sumatriptan] Nausea And Vomiting and Hypertension   Nsaids Other (See Comments)    Gerd as result taking.   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   Patient drinks two cups of coffee daily.   Patient is right-handed.   Social Drivers of Corporate investment banker Strain: Low Risk  (03/04/2022)   Overall Financial Resource Strain (CARDIA)    Difficulty of Paying Living Expenses: Not hard at all  Food Insecurity: No Food Insecurity (05/03/2023)   Hunger Vital Sign    Worried About Running Out of Food in the Last Year: Never true    Ran Out of Food in the Last Year: Never true  Transportation Needs: No Transportation Needs (05/03/2023)   PRAPARE - Administrator, Civil Service (Medical): No    Lack of Transportation (Non-Medical): No  Physical Activity: Sufficiently Active (03/04/2022)   Exercise Vital Sign    Days of Exercise per Week: 5 days    Minutes of Exercise per Session: 40 min  Stress: No Stress Concern Present (03/04/2022)   [employer] of Occupational Health - Occupational Stress Questionnaire    Feeling of Stress : Not at all  Social Connections: Moderately Integrated (03/04/2022)   Social Connection and Isolation Panel    Frequency of Communication with Friends and Family: More than three times a week    Frequency of Social Gatherings with Friends and Family: More than three times a week    Attends Religious Services: Never    Database administrator or Organizations: Yes    Attends Engineer, structural: More than 4 times per year    Marital Status: Married  Catering manager Violence: Not At Risk (05/03/2023)   Humiliation, Afraid, Rape, and Kick questionnaire    Fear of Current or Ex-Partner: No    Emotionally Abused: No    Physically Abused: No    Sexually Abused: No     Review of Systems: General: negative for chills, fever, night sweats or weight changes.  Cardiovascular: negative for chest pain, dyspnea on  exertion, edema, orthopnea, palpitations, paroxysmal nocturnal dyspnea or shortness of breath Dermatological: negative for rash Respiratory: negative for cough or wheezing Urologic: negative for hematuria Abdominal: negative for nausea, vomiting, diarrhea, bright red blood per rectum, melena, or hematemesis Neurologic: negative for visual changes, syncope, or dizziness All other systems reviewed and are otherwise negative except as noted above.    Blood pressure 124/66, pulse 64, height 5' 5.5 (1.664 m), weight 132 lb (59.9 kg), SpO2 98%.  General appearance: alert and no distress Neck: no adenopathy, no carotid bruit, no JVD, supple, symmetrical, trachea midline, and thyroid  not enlarged, symmetric, no tenderness/mass/nodules Lungs: clear to auscultation bilaterally Heart: regular rate and rhythm, S1, S2 normal, no murmur, click, rub or gallop Extremities: extremities normal, atraumatic, no cyanosis  or edema Pulses: 2+ and symmetric Skin: Skin color, texture, turgor normal. No rashes or lesions Neurologic: Grossly normal  EKG not performed today      ASSESSMENT AND PLAN:   Hyperlipidemia History of hyperlipidemia on low-dose statin therapy with lipid profile performed 03/13/2023 revealing total cholesterol 180, LDL of 96 and HDL of 70.  PVC's (premature ventricular contractions) History of PVCs improved on metoprolol .  She was on amiodarone  which she did not tolerate.  I had sent her to Dr. Cindie for evaluation of this.  Elevated coronary artery calcium  score History of elevated coronary calcium  score of 14.  She is on statin therapy for risk factor optimization.  She denies chest pain.  Essential hypertension History of essential hypertension with blood pressure measured today 124/66.  She is on metoprolol .  Bilateral lower extremity edema Patient felt bilateral lower extreme edema when she was out [place] with her granddaughter earlier this month.  This was  associated with some increasing shortness of breath.  This has since resolved spontaneously.     Dorn DOROTHA Lesches MD FACP,FACC,FAHA, [facility] 12/21/2023 10:56 AM

## 2023-12-21 NOTE — Assessment & Plan Note (Signed)
 History of hyperlipidemia on low-dose statin therapy with lipid profile performed 03/13/2023 revealing total cholesterol 180, LDL of 96 and HDL of 70.

## 2023-12-21 NOTE — Assessment & Plan Note (Signed)
 History of PVCs improved on metoprolol .  She was on amiodarone  which she did not tolerate.  I had sent her to Dr. Cindie for evaluation of this.

## 2023-12-21 NOTE — Patient Instructions (Signed)
 Medication Instructions:  Your physician recommends that you continue on your current medications as directed. Please refer to the Current Medication list given to you today.  *If you need a refill on your cardiac medications before your next appointment, please call your pharmacy*  Follow-Up: At [facility], you and your health needs are our priority.  As part of our continuing mission to provide you with exceptional heart care, our providers are all part of one team.  This team includes your primary Cardiologist (physician) and Advanced Practice Providers or APPs (Physician Assistants and Nurse Practitioners) who all work together to provide you with the care you need, when you need it.  Your next appointment:   3 month(s)  Provider:   Jon Hails, PA-C, Callie Goodrich, PA-C, Kathleen Johnson, PA-C, Hao Meng, PA-C, or Katlyn West, NP         Then, Dorn Lesches, MD will plan to see you again in 12 month(s).     We recommend signing up for the patient portal called MyChart.  Sign up information is provided on this After Visit Summary.  MyChart is used to connect with patients for Virtual Visits (Telemedicine).  Patients are able to view lab/test results, encounter notes, upcoming appointments, etc.  Non-urgent messages can be sent to your provider as well.   To learn more about what you can do with MyChart, go to [email or web address].

## 2024-01-16 ENCOUNTER — Ambulatory Visit (HOSPITAL_COMMUNITY)
Admission: RE | Admit: 2024-01-16 | Discharge: 2024-01-16 | Disposition: A | Discharge status: Home / Self Care | Source: Ambulatory Visit | Attending: Cardiology | Admitting: Cardiology

## 2024-01-16 ENCOUNTER — Ambulatory Visit: Payer: Self-pay | Admitting: Cardiology

## 2024-01-16 DIAGNOSIS — R0602 Shortness of breath: Secondary | ICD-10-CM | POA: Insufficient documentation

## 2024-01-16 DIAGNOSIS — I2510 Atherosclerotic heart disease of native coronary artery without angina pectoris: Secondary | ICD-10-CM | POA: Diagnosis not present

## 2024-01-16 DIAGNOSIS — R5383 Other fatigue: Secondary | ICD-10-CM | POA: Diagnosis not present

## 2024-01-16 DIAGNOSIS — R0600 Dyspnea, unspecified: Secondary | ICD-10-CM

## 2024-01-16 DIAGNOSIS — I493 Ventricular premature depolarization: Secondary | ICD-10-CM | POA: Insufficient documentation

## 2024-01-16 DIAGNOSIS — E785 Hyperlipidemia, unspecified: Secondary | ICD-10-CM | POA: Insufficient documentation

## 2024-01-16 DIAGNOSIS — I10 Essential (primary) hypertension: Secondary | ICD-10-CM | POA: Diagnosis not present

## 2024-01-16 DIAGNOSIS — R600 Localized edema: Secondary | ICD-10-CM | POA: Diagnosis not present

## 2024-01-16 DIAGNOSIS — T7029XS Other effects of high altitude, sequela: Secondary | ICD-10-CM | POA: Insufficient documentation

## 2024-01-16 DIAGNOSIS — I340 Nonrheumatic mitral (valve) insufficiency: Secondary | ICD-10-CM | POA: Insufficient documentation

## 2024-01-16 DIAGNOSIS — I4729 Other ventricular tachycardia: Secondary | ICD-10-CM | POA: Insufficient documentation

## 2024-01-16 LAB — ECHOCARDIOGRAM COMPLETE
Area-P 1/2: 4.41 cm2
S' Lateral: 3.31 cm

## 2024-01-16 NOTE — Progress Notes (Signed)
 Essentially normal echocardiogram.

## 2024-01-17 ENCOUNTER — Other Ambulatory Visit: Payer: Self-pay | Admitting: Cardiovascular Disease

## 2024-01-20 DIAGNOSIS — Z79899 Other long term (current) drug therapy: Secondary | ICD-10-CM | POA: Diagnosis not present

## 2024-01-30 DIAGNOSIS — N959 Unspecified menopausal and perimenopausal disorder: Secondary | ICD-10-CM | POA: Diagnosis not present

## 2024-01-30 DIAGNOSIS — Z1272 Encounter for screening for malignant neoplasm of vagina: Secondary | ICD-10-CM | POA: Diagnosis not present

## 2024-01-30 DIAGNOSIS — Z6821 Body mass index (BMI) 21.0-21.9, adult: Secondary | ICD-10-CM | POA: Diagnosis not present

## 2024-01-30 DIAGNOSIS — Z124 Encounter for screening for malignant neoplasm of cervix: Secondary | ICD-10-CM | POA: Diagnosis not present

## 2024-01-30 DIAGNOSIS — N816 Rectocele: Secondary | ICD-10-CM | POA: Diagnosis not present

## 2024-01-30 DIAGNOSIS — N951 Menopausal and female climacteric states: Secondary | ICD-10-CM | POA: Diagnosis not present

## 2024-02-03 ENCOUNTER — Other Ambulatory Visit: Payer: Self-pay

## 2024-02-15 ENCOUNTER — Ambulatory Visit: Attending: Obstetrics and Gynecology | Admitting: Physical Therapy

## 2024-02-15 DIAGNOSIS — M6281 Muscle weakness (generalized): Secondary | ICD-10-CM | POA: Insufficient documentation

## 2024-02-15 DIAGNOSIS — R279 Unspecified lack of coordination: Secondary | ICD-10-CM | POA: Diagnosis not present

## 2024-02-15 DIAGNOSIS — R293 Abnormal posture: Secondary | ICD-10-CM | POA: Insufficient documentation

## 2024-02-15 NOTE — Therapy (Signed)
 OUTPATIENT PHYSICAL THERAPY FEMALE PELVIC EVALUATION   Patient Name: [name] MRN: [id number] DOB:1940/06/03, 83 y.o., female Today's Date: 02/15/2024  END OF SESSION:  PT End of Session - 02/15/24 1652     Visit Number 1    Number of Visits 10    Date for Recertification  04/25/24    Authorization Type [employer]    PT Start Time 0330    PT Stop Time 0415    PT Time Calculation (min) 45 min    Activity Tolerance Patient tolerated treatment well    Behavior During Therapy [facility] for tasks assessed/performed         Past Medical History:  Diagnosis Date   Adrenal gland dysfunction     dr Mat, coricare B now resolved   Anemia    iron   Arthritis    psoriatic  in hips oa in hands   Blood transfusion without reported diagnosis 1978   Cataract    chronic headaches    histamine migraines, rarely occur   Chronic kidney disease    hx bladder infections yrs ago   Colon polyp yrs ago   Dysrhythmia 1996 to 1999   wore holter monitor result of accidental fall, occasional irregular pulse   Family history of adverse reaction to anesthesia    father had aspiration after anesthesia one time   Gastric polyps    GERD (gastroesophageal reflux disease)    History of migraine headaches    Hyperlipidemia    Hypothyroidism    Neuromuscular disease (HCC)    fibromyalgia   Pneumonia 2005aspiration pneumonia   stopped when got pneumonia shot, also 2015 regular pneumonia   PONV (postoperative nausea and vomiting)    patient wishes to have no anesthetics that could affect memory due to past memory issues, no cause found   Psoriatic arthritis (HCC)    PVC (premature ventricular contraction)    Past Surgical History:  Procedure Laterality Date   ABDOMINAL HYSTERECTOMY     left ovary left and tube left   APPENDECTOMY     with hysterectomy   CARDIAC CATHETERIZATION     CATARACT EXTRACTION, BILATERAL     CHOLECYSTECTOMY     COLONOSCOPY     FINGER SURGERY     middle  finger right hand   INCONTINENCE SURGERY     bladder sling   RECTOCELE REPAIR     RHINOPLASTY     SHOULDER ARTHROSCOPY WITH ROTATOR CUFF REPAIR AND SUBACROMIAL DECOMPRESSION Right 06/17/2015   Procedure: SHOULDER DIAGNOSTIC OPERATIVE ARTHROSCOPY WITH MINI-OPEN ROTATOR CUFF REPAIR AND SUBACROMIAL DECOMPRESSION;  Surgeon: Glendia Cordella Hutchinson, MD;  Location: [facility] OR;  Service: Orthopedics;  Laterality: Right;   TONSILLECTOMY  age 31   adenoids also   TOTAL HIP ARTHROPLASTY Right 07/06/2017   Procedure: RIGHT TOTAL HIP ARTHROPLASTY ANTERIOR APPROACH;  Surgeon: Melodi Lerner, MD;  Location: [facility];  Service: Orthopedics;  Laterality: Right;   Patient Active Problem List   Diagnosis Date Noted   Bilateral lower extremity edema 12/21/2023   Essential hypertension 05/03/2023   Dehydration 05/03/2023   Elevated coronary artery calcium  score 12/24/2022   Burning sensation 05/12/2022   Routine general medical examination at a health care facility 03/19/2022   PVC's (premature ventricular contractions) 08/04/2021   Hypothyroidism 03/05/2021   Migraine 03/05/2021   Hyperlipidemia 10/16/2020   Femoral nerve lesion, right 02/16/2018   OA (osteoarthritis) of hip 07/06/2017   Cervical disc disorder with radiculopathy of cervical region 03/16/2016   Contracture of  joint of right shoulder region 03/16/2016   DDD (degenerative disc disease), cervical 12/01/2015   MCI (mild cognitive impairment) 12/01/2015   Psoriatic arthritis (HCC) 11/08/2013   Adrenal gland dysfunction     PCP: Claudene Pellet, MD  REFERRING PROVIDER: Mat Browning, MD  REFERRING DIAG:  N81.6 (ICD-10-CM) - Rectocele   THERAPY DIAG:  Muscle weakness (generalized)  Unspecified lack of coordination  Abnormal posture  Rationale for Evaluation and Treatment: Rehabilitation  ONSET DATE: 2024  SUBJECTIVE:                                                                                                                                                                                            SUBJECTIVE STATEMENT: Eval: Patient reports to PFPT with a rectocele that she has had before in 2005. She's had bladder sling repair and rectocele repair surgery in the past for this reason. Now, starting about a year ago, she began noticing her stool was very thin and she was having difficulty passing complete bowel movements/feeling empty. She has repetitive bowel movements throughout the day. She feels like her stool is getting caught in the rectum. Patient states that the pressure from incomplete bowel movements is rated as 5/10 or under on the discomfort scale when this happens. Fluid intake: water , milk with coffee (2 cups daily)  FUNCTIONAL LIMITATIONS: toileting and evacuation of stool   PERTINENT HISTORY:  Medications for current condition: estradiol  patch that she changes weekly, synthroid  in the mornings, Colace as needed  Surgeries: hysterectomy at age 40 Sexual abuse: No  PAIN:  Are you having pain? No NPRS scale: 0/10  PRECAUTIONS: None  RED FLAGS: None   WEIGHT BEARING RESTRICTIONS: No  FALLS:  Has patient fallen in last 6 months? No  OCCUPATION: retired   ACTIVITY LEVEL : usually on her feet throughout the day, walks 2 miles 3 days a week, trying to increase this   PLOF: Independent  PATIENT GOALS: to have more complete bowel movements and have less discomfort   BOWEL MOVEMENT: Pain with bowel movement: No Type of bowel movement:Type (Bristol Stool Scale) 4-5, Frequency 1-5x/day, most days it is 3-5x/day, Strain no, and Splinting PRN Fully empty rectum: No Leakage: No                                                     Caused by:  Pads: No Fiber supplement/laxative Yes prunes and colace   URINATION: Pain with urination: No Fully empty  bladder: Yes:                                  Post-void dribble: No Stream: Strong Urgency: Yes  Frequency:during the day within normal limits                                                           Nocturia: No   Leakage: none Pads/briefs: No  PROLAPSE: Pressure in the posterior aspect of vaginal canal   OBJECTIVE:  Note: Objective measures were completed at Evaluation unless otherwise noted.  PATIENT SURVEYS:  PFIQ-7: 7  COGNITION: Overall cognitive status: Within functional limits for tasks assessed     SENSATION: Light touch: Appears intact  LUMBAR SPECIAL TESTS:  Single leg stance test: Positive  FUNCTIONAL TESTS:  Single leg stance:  Rt: +  Lt: + Sit-up test: 1/3 Squat: general lumbopelvic stiffness present with  Bed mobility: within normal limits   GAIT: Assistive device utilized: None Comments: mild trendelenburg gait pattern with ambulation   POSTURE: rounded shoulders, forward head, and flexed trunk   LUMBARAROM/PROM: within normal limits for all motions tested bilaterally with no pain   LOWER EXTREMITY ROM: within normal limits for all motions tested bilaterally with no pain   LOWER EXTREMITY MMT: 4/5 bilateral knees and hips grossly   PALPATION:  General: no tenderness to hip flexor palpation or adductor palpation   Pelvic Alignment: within normal limits   Abdominal: abdominal bracing at rest   Diastasis: No Distortion: No  Breathing: upper chest breathing and abdominal bracing at rest with decreased lower rib excursion with inhalation  Scar tissue: Yes: surgical                 External Perineal Exam: dryness present with decreased tissue bulk in superficial pelvic floor musculature                              Internal Pelvic Floor: Patient fully consents to today's internal examination. She demonstrates low tone and no palpable trigger points throughout superficial and deep pelvic floor musculature. She has a weak pelvic floor contraction in supine that improves when paired with an exhale to achieve more normal range of motion. She demonstrates posterior vaginal wall laxity with cough test in supine.  No pain during or following today's exam.  Patient confirms identification and approves PT to assess internal pelvic floor and treatment Yes No emotional/communication barriers or cognitive limitation. Patient is motivated to learn. Patient understands and agrees with treatment goals and plan. PT explains patient will be examined in standing, sitting, and lying down to see how their muscles and joints work. When they are ready, they will be asked to remove their underwear so PT can examine their perineum. The patient is also given the option of providing their own chaperone as one is not provided in our facility. The patient also has the right and is explained the right to defer or refuse any part of the evaluation or treatment including the internal exam. With the patient's consent, PT will use one gloved finger to gently assess the muscles of the pelvic floor, seeing how well it contracts and relaxes and if there is  muscle symmetry. After, the patient will get dressed and PT and patient will discuss exam findings and plan of care. PT and patient discuss plan of care, schedule, attendance policy and HEP activities.  PELVIC MMT:   MMT eval  Vaginal 3/5, 6 quick flicks, 2 second hold   Internal Anal Sphincter   External Anal Sphincter   Puborectalis   Diastasis Recti   (Blank rows = not tested)       TONE: Low in bilateral aspects of superficial and deep pelvic floor musculature   PROLAPSE: Mild Posterior vaginal wall laxity present with cough prolapse test in hooklying   TODAY'S TREATMENT:                                                                                                                              DATE:   EVAL 02/15/24: Examination completed, findings reviewed, pt educated on POC, HEP, and self care. Pt motivated to participate in PT and agreeable to attempt recommendations.   Hooklying diaphragmatic breathing + pelvic floor contraction 2x10  Hooklying diaphragmatic breathing +  pelvic floor quick flick contractions   PATIENT EDUCATION:  Education details: relative anatomy and the connection between the diaphragm and pelvic floor, how water  intake and bladder irritants affect the pelvis,  Person educated: Patient Education method: Explanation, Demonstration, Tactile cues, Verbal cues, and Handouts Education comprehension: verbalized understanding, returned demonstration, verbal cues required, tactile cues required, and needs further education  HOME EXERCISE PROGRAM: Access Code: [email or web address] URL: [email or web address] Date: 02/15/2024 Prepared by: Celena Domino  Exercises - Supine Pelvic Floor Contraction  - 1 x daily - 7 x weekly - 2 sets - 10 reps - Quick Flick Pelvic Floor Contractions in Hooklying  - 1 x daily - 7 x weekly - 2 sets - 10 reps  ASSESSMENT:  CLINICAL IMPRESSION: Patient is a 83 y.o. female  who was seen today for physical therapy evaluation and treatment for rectocele treatment. She's had bladder sling repair and rectocele repair surgery in the past for this reason. Now, starting about a year ago, she began noticing her stool was very thin and she was having difficulty passing complete bowel movements/feeling empty. She has repetitive bowel movements throughout the day. She feels like her stool is getting caught in the rectum. Patient states that the pressure from incomplete bowel movements is rated as 5/10 or under on the discomfort scale when this happens. Patient fully consents to today's internal examination. She demonstrates low tone and no palpable trigger points throughout superficial and deep pelvic floor musculature. She has a weak pelvic floor contraction in supine that improves when paired with an exhale to achieve more normal range of motion. Lack of coordination and weakness could be causing the patient's inability to defecate completely. She demonstrates posterior vaginal wall laxity with cough test in supine. No pain during or  following today's exam. Overall, patient tolerated evaluation well and Pt would benefit from additional PT to further address  deficits.    OBJECTIVE IMPAIRMENTS: decreased coordination, decreased endurance, decreased mobility, decreased ROM, decreased strength, and pain.   ACTIVITY LIMITATIONS: continence and toileting  PARTICIPATION LIMITATIONS: complete bowel movements   PERSONAL FACTORS: Age, Past/current experiences, and Time since onset of injury/illness/exacerbation are also affecting patient's functional outcome.   REHAB POTENTIAL: Good  CLINICAL DECISION MAKING: Stable/uncomplicated  EVALUATION COMPLEXITY: Low   GOALS: Goals reviewed with patient? Yes  SHORT TERM GOALS: Target date: 03/14/2024  Pt will be independent with HEP.  Baseline: Goal status: INITIAL  2.  Pt will be able to correctly perform diaphragmatic breathing and appropriate pressure management in order to prevent worsening vaginal wall laxity and improve pelvic floor A/ROM.  Baseline:  Goal status: INITIAL  3.  Pt will be independent with use of squatty potty, relaxed toileting mechanics, and improved bowel movement techniques in order to increase ease of bowel movements and complete evacuation.  Baseline:  Goal status: INITIAL  LONG TERM GOALS: Target date: 08/14/2024  Pt will be independent with advanced HEP.  Baseline:  Goal status: INITIAL  2.  Pt to demonstrate improved coordination of pelvic floor and breathing mechanics with 10# squat with appropriate synergistic patterns to decrease rectal pressure at least 75% of the time for improved ability to complete a 30 minute workout with strain at pelvic floor and symptoms.   Baseline:  Goal status: INITIAL  3.  Pt will report her BMs are complete due to improved bowel habits and evacuation techniques.  Baseline:  Goal status: INITIAL  4.  Pt will demonstrate 3+/5 pelvic floor muscle strength with appropriate coordination in order to decrease  urinary incontinence, urgency and frequency.   Baseline: 3 with improper coordination  Goal status: INITIAL  PLAN:  PT FREQUENCY: 1-2x/week  PT DURATION: other: 3 months   PLANNED INTERVENTIONS: 97110-Therapeutic exercises, 97530- Therapeutic activity, 97112- Neuromuscular re-education, 97535- Self Care, [id number]- Manual therapy, Patient/Family education, Taping, Joint mobilization, Spinal mobilization, Scar mobilization, Cryotherapy, and Moist heat  PLAN FOR NEXT SESSION: continued pelvic floor AROM training in seated and varying positions, introduce hip strengthening and core strengthening, gluteal strengthening, toileting mechanics for optimal emptying, re-coordination of toileting mechanics with defecation   Celena JAYSON Domino, PT 02/15/2024, 4:53 PM [facility] [address] [place], [place] [address] Phone # [phone] Fax [phone]

## 2024-02-21 ENCOUNTER — Ambulatory Visit: Admitting: Physical Therapy

## 2024-02-21 ENCOUNTER — Telehealth: Payer: Self-pay | Admitting: Physical Therapy

## 2024-02-21 DIAGNOSIS — M6281 Muscle weakness (generalized): Secondary | ICD-10-CM | POA: Diagnosis not present

## 2024-02-21 DIAGNOSIS — R279 Unspecified lack of coordination: Secondary | ICD-10-CM | POA: Diagnosis not present

## 2024-02-21 DIAGNOSIS — R293 Abnormal posture: Secondary | ICD-10-CM

## 2024-02-21 NOTE — Telephone Encounter (Signed)
 Patient offered today's 4:15 PM visit slot and she was in agreement and scheduled.  Celena Domino, PT, DPT 02/21/24 12:27 PM

## 2024-02-21 NOTE — Therapy (Signed)
 OUTPATIENT PHYSICAL THERAPY FEMALE PELVIC TREATMENT   Patient Name: [name] MRN: [id number] DOB:[birth year], 83 y.o., female Today's Date: 02/21/2024  END OF SESSION:  PT End of Session - 02/21/24 1652     Visit Number 2    Number of Visits 10    Date for Recertification  04/25/24    Authorization Type [employer]    PT Start Time [phone]    PT Stop Time 0500    PT Time Calculation (min) 45 min    Activity Tolerance Patient tolerated treatment well    Behavior During Therapy [facility] for tasks assessed/performed          Past Medical History:  Diagnosis Date   Adrenal gland dysfunction     dr Mat, coricare B now resolved   Anemia    iron   Arthritis    psoriatic  in hips oa in hands   Blood transfusion without reported diagnosis 1978   Cataract    chronic headaches    histamine migraines, rarely occur   Chronic kidney disease    hx bladder infections yrs ago   Colon polyp yrs ago   Dysrhythmia 1996 to 1999   wore holter monitor result of accidental fall, occasional irregular pulse   Family history of adverse reaction to anesthesia    father had aspiration after anesthesia one time   Gastric polyps    GERD (gastroesophageal reflux disease)    History of migraine headaches    Hyperlipidemia    Hypothyroidism    Neuromuscular disease (HCC)    fibromyalgia   Pneumonia 2005aspiration pneumonia   stopped when got pneumonia shot, also 2015 regular pneumonia   PONV (postoperative nausea and vomiting)    patient wishes to have no anesthetics that could affect memory due to past memory issues, no cause found   Psoriatic arthritis (HCC)    PVC (premature ventricular contraction)    Past Surgical History:  Procedure Laterality Date   ABDOMINAL HYSTERECTOMY     left ovary left and tube left   APPENDECTOMY     with hysterectomy   CARDIAC CATHETERIZATION     CATARACT EXTRACTION, BILATERAL     CHOLECYSTECTOMY     COLONOSCOPY     FINGER SURGERY     middle  finger right hand   INCONTINENCE SURGERY     bladder sling   RECTOCELE REPAIR     RHINOPLASTY     SHOULDER ARTHROSCOPY WITH ROTATOR CUFF REPAIR AND SUBACROMIAL DECOMPRESSION Right 06/17/2015   Procedure: SHOULDER DIAGNOSTIC OPERATIVE ARTHROSCOPY WITH MINI-OPEN ROTATOR CUFF REPAIR AND SUBACROMIAL DECOMPRESSION;  Surgeon: Glendia Cordella Hutchinson, MD;  Location: [facility] OR;  Service: Orthopedics;  Laterality: Right;   TONSILLECTOMY  age 22   adenoids also   TOTAL HIP ARTHROPLASTY Right 07/06/2017   Procedure: RIGHT TOTAL HIP ARTHROPLASTY ANTERIOR APPROACH;  Surgeon: Melodi Lerner, MD;  Location: [facility] OR[facility];  Service: Orthopedics;  Laterality: Right;   Patient Active Problem List   Diagnosis Date Noted   Bilateral lower extremity edema 12/21/2023   Essential hypertension 05/03/2023   Dehydration 05/03/2023   Elevated coronary artery calcium  score 12/24/2022   Burning sensation 05/12/2022   Routine general medical examination at a health care facility 03/19/2022   PVC's (premature ventricular contractions) 08/04/2021   Hypothyroidism 03/05/2021   Migraine 03/05/2021   Hyperlipidemia 10/16/2020   Femoral nerve lesion, right 02/16/2018   OA (osteoarthritis) of hip 07/06/2017   Cervical disc disorder with radiculopathy of cervical region 03/16/2016   Contracture  of joint of right shoulder region 03/16/2016   DDD (degenerative disc disease), cervical 12/01/2015   MCI (mild cognitive impairment) 12/01/2015   Psoriatic arthritis (HCC) 11/08/2013   Adrenal gland dysfunction     PCP: Claudene Pellet, MD  REFERRING PROVIDER: Mat Browning, MD  REFERRING DIAG:  N81.6 (ICD-10-CM) - Rectocele   THERAPY DIAG:  Muscle weakness (generalized)  Unspecified lack of coordination  Abnormal posture  Rationale for Evaluation and Treatment: Rehabilitation  ONSET DATE: 2024  SUBJECTIVE:                                                                                                                                                                                            SUBJECTIVE STATEMENT: She tried her exercises consistently. She has had a few solid bowel movements that she felt were complete, which is a nice change.   Eval: Patient reports to PFPT with a rectocele that she has had before in 2005. She's had bladder sling repair and rectocele repair surgery in the past for this reason. Now, starting about a year ago, she began noticing her stool was very thin and she was having difficulty passing complete bowel movements/feeling empty. She has repetitive bowel movements throughout the day. She feels like her stool is getting caught in the rectum. Patient states that the pressure from incomplete bowel movements is rated as 5/10 or under on the discomfort scale when this happens. Fluid intake: water , milk with coffee (2 cups daily)  FUNCTIONAL LIMITATIONS: toileting and evacuation of stool   PERTINENT HISTORY:  Medications for current condition: estradiol  patch that she changes weekly, synthroid  in the mornings, Colace as needed  Surgeries: hysterectomy at age 49 Sexual abuse: No  PAIN:  Are you having pain? No NPRS scale: 0/10  PRECAUTIONS: None  RED FLAGS: None   WEIGHT BEARING RESTRICTIONS: No  FALLS:  Has patient fallen in last 6 months? No  OCCUPATION: retired   ACTIVITY LEVEL : usually on her feet throughout the day, walks 2 miles 3 days a week, trying to increase this   PLOF: Independent  PATIENT GOALS: to have more complete bowel movements and have less discomfort   BOWEL MOVEMENT: Pain with bowel movement: No Type of bowel movement:Type (Bristol Stool Scale) 4-5, Frequency 1-5x/day, most days it is 3-5x/day, Strain no, and Splinting PRN Fully empty rectum: No Leakage: No  Caused by:  Pads: No Fiber supplement/laxative Yes prunes and colace   URINATION: Pain with urination: No Fully empty bladder: Yes:                                   Post-void dribble: No Stream: Strong Urgency: Yes  Frequency:during the day within normal limits                                                          Nocturia: No   Leakage: none Pads/briefs: No  PROLAPSE: Pressure in the posterior aspect of vaginal canal   OBJECTIVE:  Note: Objective measures were completed at Evaluation unless otherwise noted.  PATIENT SURVEYS:  PFIQ-7: 16  COGNITION: Overall cognitive status: Within functional limits for tasks assessed     SENSATION: Light touch: Appears intact  LUMBAR SPECIAL TESTS:  Single leg stance test: Positive  FUNCTIONAL TESTS:  Single leg stance:  Rt: +  Lt: + Sit-up test: 1/3 Squat: general lumbopelvic stiffness present with  Bed mobility: within normal limits   GAIT: Assistive device utilized: None Comments: mild trendelenburg gait pattern with ambulation   POSTURE: rounded shoulders, forward head, and flexed trunk   LUMBARAROM/PROM: within normal limits for all motions tested bilaterally with no pain   LOWER EXTREMITY ROM: within normal limits for all motions tested bilaterally with no pain   LOWER EXTREMITY MMT: 4/5 bilateral knees and hips grossly   PALPATION:  General: no tenderness to hip flexor palpation or adductor palpation   Pelvic Alignment: within normal limits   Abdominal: abdominal bracing at rest   Diastasis: No Distortion: No  Breathing: upper chest breathing and abdominal bracing at rest with decreased lower rib excursion with inhalation  Scar tissue: Yes: surgical                 External Perineal Exam: dryness present with decreased tissue bulk in superficial pelvic floor musculature                              Internal Pelvic Floor: Patient fully consents to today's internal examination. She demonstrates low tone and no palpable trigger points throughout superficial and deep pelvic floor musculature. She has a weak pelvic floor contraction in supine that improves when  paired with an exhale to achieve more normal range of motion. She demonstrates posterior vaginal wall laxity with cough test in supine. No pain during or following today's exam.  Patient confirms identification and approves PT to assess internal pelvic floor and treatment Yes No emotional/communication barriers or cognitive limitation. Patient is motivated to learn. Patient understands and agrees with treatment goals and plan. PT explains patient will be examined in standing, sitting, and lying down to see how their muscles and joints work. When they are ready, they will be asked to remove their underwear so PT can examine their perineum. The patient is also given the option of providing their own chaperone as one is not provided in our facility. The patient also has the right and is explained the right to defer or refuse any part of the evaluation or treatment including the internal exam. With the patient's consent, PT will use one gloved finger  to gently assess the muscles of the pelvic floor, seeing how well it contracts and relaxes and if there is muscle symmetry. After, the patient will get dressed and PT and patient will discuss exam findings and plan of care. PT and patient discuss plan of care, schedule, attendance policy and HEP activities.  PELVIC MMT:   MMT eval  Vaginal 3/5, 6 quick flicks, 2 second hold   Internal Anal Sphincter   External Anal Sphincter   Puborectalis   Diastasis Recti   (Blank rows = not tested)       TONE: Low in bilateral aspects of superficial and deep pelvic floor musculature   PROLAPSE: Mild Posterior vaginal wall laxity present with cough prolapse test in hooklying   TODAY'S TREATMENT:                                                                                                                              DATE:   EVAL 02/15/24: Examination completed, findings reviewed, pt educated on POC, HEP, and self care. Pt motivated to participate in PT and  agreeable to attempt recommendations.   Hooklying diaphragmatic breathing + pelvic floor contraction 2x10  Hooklying diaphragmatic breathing + pelvic floor quick flick contractions   02/21/24: Seated pelvic floor contraction + diaphragmatic breathing 2x10  Seated pelvic floor quick flick contractions + diaphragmatic breathing 2x10  Toileting mechanics for optimal emptying of bowels/bladder  Blow as you go technique  Urge drill for managing urge urinary incontinence   PATIENT EDUCATION:  Education details: relative anatomy and the connection between the diaphragm and pelvic floor, how water  intake and bladder irritants affect the pelvis,  Person educated: Patient Education method: Explanation, Demonstration, Tactile cues, Verbal cues, and Handouts Education comprehension: verbalized understanding, returned demonstration, verbal cues required, tactile cues required, and needs further education  HOME EXERCISE PROGRAM: Access Code: [email or web address] URL: [email or web address] Date: 02/21/2024 Prepared by: Celena Domino  Exercises - Seated Pelvic Floor Contraction  - 1 x daily - 7 x weekly - 2 sets - 10 reps - Seated Quick Flick Pelvic Floor Contractions  - 1 x daily - 7 x weekly - 2 sets - 10 reps  ASSESSMENT:  CLINICAL IMPRESSION: Patient is a 83 y.o. female  who was seen today for physical therapy treatment for rectocele treatment. Patient has been consistent with HEP and reports understanding of all exercise progressions today. Toileting mechanics introduced and patient education on urge drill for managing urge urinary incontinence when trying to get to the bathroom on time. Overall, patient tolerated evaluation well and Pt would benefit from additional PT to further address deficits.    OBJECTIVE IMPAIRMENTS: decreased coordination, decreased endurance, decreased mobility, decreased ROM, decreased strength, and pain.   ACTIVITY LIMITATIONS: continence and  toileting  PARTICIPATION LIMITATIONS: complete bowel movements   PERSONAL FACTORS: Age, Past/current experiences, and Time since onset of injury/illness/exacerbation are also affecting patient's functional outcome.   REHAB POTENTIAL: Good  CLINICAL DECISION MAKING: Stable/uncomplicated  EVALUATION COMPLEXITY: Low   GOALS: Goals reviewed with patient? Yes  SHORT TERM GOALS: Target date: 03/14/2024  Pt will be independent with HEP.  Baseline: Goal status: INITIAL  2.  Pt will be able to correctly perform diaphragmatic breathing and appropriate pressure management in order to prevent worsening vaginal wall laxity and improve pelvic floor A/ROM.  Baseline:  Goal status: INITIAL  3.  Pt will be independent with use of squatty potty, relaxed toileting mechanics, and improved bowel movement techniques in order to increase ease of bowel movements and complete evacuation.  Baseline:  Goal status: INITIAL  LONG TERM GOALS: Target date: 08/14/2024  Pt will be independent with advanced HEP.  Baseline:  Goal status: INITIAL  2.  Pt to demonstrate improved coordination of pelvic floor and breathing mechanics with 10# squat with appropriate synergistic patterns to decrease rectal pressure at least 75% of the time for improved ability to complete a 30 minute workout with strain at pelvic floor and symptoms.   Baseline:  Goal status: INITIAL  3.  Pt will report her BMs are complete due to improved bowel habits and evacuation techniques.  Baseline:  Goal status: INITIAL  4.  Pt will demonstrate 3+/5 pelvic floor muscle strength with appropriate coordination in order to decrease urinary incontinence, urgency and frequency.   Baseline: 3 with improper coordination  Goal status: INITIAL  PLAN:  PT FREQUENCY: 1-2x/week  PT DURATION: other: 3 months   PLANNED INTERVENTIONS: 97110-Therapeutic exercises, 97530- Therapeutic activity, 97112- Neuromuscular re-education, 97535- Self Care,  97140- Manual therapy, Patient/Family education, Taping, Joint mobilization, Spinal mobilization, Scar mobilization, Cryotherapy, and Moist heat  PLAN FOR NEXT SESSION: continued pelvic floor AROM training in seated and varying positions, introduce hip strengthening and core strengthening, gluteal strengthening, toileting mechanics for optimal emptying, re-coordination of toileting mechanics with defecation   Celena JAYSON Domino, PT 02/21/2024, 4:53 PM [facility] [address], [address] [place], [place] [address] Phone # [phone] Fax [phone]

## 2024-02-21 NOTE — Patient Instructions (Signed)
 Toileting mechanics 101: Urination  Positioning: sit all the way down with your feet flat on the floor, trunk relaxed  When you first start voiding, do not PUSH the urine out, rather let it flow naturally  You can change your position to see if it's easier to empty  Urge Drill When you feel an urge to go, follow these steps to regain control: Stop what you are doing and be still Take one deep breath, directing your air into your abdomen Think an affirming thought, such as "I've got this." Do 5 quick flicks of your pelvic floor Walk with control to the bathroom to void, or delay voiding Defecation  Positioning: sit all the way down with your feet flat on the floor, trunk relaxed  Instead of straining to push stool out, imagine you are fogging up a mirror with your breath as you exhale and gently push the stool down and out

## 2024-02-22 ENCOUNTER — Encounter: Payer: Self-pay | Admitting: Physical Therapy

## 2024-03-08 DIAGNOSIS — M542 Cervicalgia: Secondary | ICD-10-CM | POA: Diagnosis not present

## 2024-03-19 ENCOUNTER — Encounter: Payer: Self-pay | Admitting: Physician Assistant

## 2024-03-19 ENCOUNTER — Ambulatory Visit: Attending: Physician Assistant | Admitting: Physician Assistant

## 2024-03-19 VITALS — BP 102/56 | HR 67 | Resp 16 | Ht 65.0 in | Wt 135.6 lb

## 2024-03-19 DIAGNOSIS — E039 Hypothyroidism, unspecified: Secondary | ICD-10-CM | POA: Diagnosis not present

## 2024-03-19 DIAGNOSIS — I493 Ventricular premature depolarization: Secondary | ICD-10-CM

## 2024-03-19 DIAGNOSIS — R519 Headache, unspecified: Secondary | ICD-10-CM | POA: Diagnosis not present

## 2024-03-19 DIAGNOSIS — T7029XS Other effects of high altitude, sequela: Secondary | ICD-10-CM | POA: Diagnosis not present

## 2024-03-19 DIAGNOSIS — M0600 Rheumatoid arthritis without rheumatoid factor, unspecified site: Secondary | ICD-10-CM | POA: Diagnosis not present

## 2024-03-19 DIAGNOSIS — M4690 Unspecified inflammatory spondylopathy, site unspecified: Secondary | ICD-10-CM | POA: Diagnosis not present

## 2024-03-19 DIAGNOSIS — T7029XD Other effects of high altitude, subsequent encounter: Secondary | ICD-10-CM

## 2024-03-19 DIAGNOSIS — Z23 Encounter for immunization: Secondary | ICD-10-CM | POA: Diagnosis not present

## 2024-03-19 DIAGNOSIS — R42 Dizziness and giddiness: Secondary | ICD-10-CM | POA: Diagnosis not present

## 2024-03-19 DIAGNOSIS — E7800 Pure hypercholesterolemia, unspecified: Secondary | ICD-10-CM | POA: Diagnosis not present

## 2024-03-19 DIAGNOSIS — Z0000 Encounter for general adult medical examination without abnormal findings: Secondary | ICD-10-CM | POA: Diagnosis not present

## 2024-03-19 DIAGNOSIS — Z1331 Encounter for screening for depression: Secondary | ICD-10-CM | POA: Diagnosis not present

## 2024-03-19 NOTE — Patient Instructions (Signed)
 Medication Instructions:  Your physician recommends that you continue on your current medications as directed. Please refer to the Current Medication list given to you today.  *If you need a refill on your cardiac medications before your next appointment, please call your pharmacy*  Lab Work: CBC, CMP, TSH, Free T4, BNP. If you have labs (blood work) drawn today and your tests are completely normal, you will receive your results only by: MyChart Message (if you have MyChart) OR A paper copy in the mail If you have any lab test that is abnormal or we need to change your treatment, we will call you to review the results.  Testing/Procedures: none  Follow-Up: At [facility], you and your health needs are our priority.  As part of our continuing mission to provide you with exceptional heart care, our providers are all part of one team.  This team includes your primary Cardiologist (physician) and Advanced Practice Providers or APPs (Physician Assistants and Nurse Practitioners) who all work together to provide you with the care you need, when you need it.  Your next appointment:   3 month(s)  Provider:  Hao Meng, PA-C        We recommend signing up for the patient portal called MyChart.  Sign up information is provided on this After Visit Summary.  MyChart is used to connect with patients for Virtual Visits (Telemedicine).  Patients are able to view lab/test results, encounter notes, upcoming appointments, etc.  Non-urgent messages can be sent to your provider as well.   To learn more about what you can do with MyChart, go to [email or web address].   Other Instructions none

## 2024-03-19 NOTE — Progress Notes (Signed)
 Cardiology Office Note   Date:  03/19/2024  ID:  [name], DOB 18-May-1941, MRN [id number] PCP: Claudene Pellet, MD  [facility] Providers Cardiologist:  Dorn Lesches, MD Electrophysiologist:  OLE ONEIDA HOLTS, MD     History of Present Illness [name] is a 83 y.o. female with past medical history of PVCs and NSVT seen on heart monitor in September 2022 on low-dose metoprolol . Echocardiogram in June 2022 was normal with EF 55 to 60%, no regional wall motion abnormality, normal RV, trivial MR.  Nuclear stress test obtained on 10/29/2020 showed EF 44%, normal perfusion without ischemia infarction.  Patient had sustained VT during recovery.  Coronary calcium  scoring test obtained on 02/11/2021 showed coronary calcium  score of 13.9 which placed the patient at 27th percentile for age and sex matched control.  She was referred to Dr. Holts who started her on low-dose amiodarone  but she did not tolerated.  She was able to tolerate a low-dose beta-blocker.  Repeat heart monitor in January 2023 showed no significant SVT.  She does have transient SVT.  Most recently, patient was seen for leg edema.  Repeat echocardiogram obtained on 01/16/2024 showed EF 55 to 60%, no regional wall motion abnormality, normal RV, mild MR.  During the recent office visit, she was switched from metoprolol  to nadolol  for better PVC control and blood pressure control.  Unfortunately she could not tolerate nadolol  either.  By the time she was followed up, lower extremity edema has spontaneously resolved.  Patient presents today for follow-up.  She continues to have dyspnea with more strenuous activity but not with everyday activity.  She also has intermittent dizziness.  Dizziness does not occur immediately after changing body position however does occur a few seconds after the change in the body position.  She has no problem bending over and to pick up something off the ground.  She first noted her dyspnea  with exertion during her trip accompanying grandkids to the [facility].  After she returned to [place] , she says her symptom has improved however she is not exerting herself as much as she did while visiting [facility].  She denies any exertional chest pain or significant palpitation.  I recommended preliminary blood work including CBC, CMP, TSH/free T4 and BNP.  I also encouraged her to increase activity level.  Previous CTA of the chest in January 2025 has been personally reviewed, there was no significant coronary calcification seen on the CT image.  Given lack of chest discomfort, I recommend exercise training first to build up tolerance.  I plan to see the patient back in 3 months, if shortness of breath with exertion worsens, I will have a low threshold of ordering a coronary CTA.  ROS:   Dyspnea with more strenuous activity but not with everyday activity.  She denies any chest pain.  She has no significant palpitation.  She does complain of occasional dizzy spell.  Studies Reviewed      Cardiac Studies & Procedures   ______________________________________________________________________________________________   STRESS TESTS  MYOCARDIAL PERFUSION IMAGING 10/29/2020  Interpretation Summary  The left ventricular ejection fraction is moderately decreased (30-44%).  Nuclear stress EF: 44%.  There were high risk electrocardiographic findings with sustained ventricular tachycardia occurring during recovery.  Myocardial perfusion appeared normal with no evidence of ischemia or infarction.  Based on ECG findings with sustained VT during recovery and moderately reduced LVEF, this is a high risk study.  Powell Sorrow, MD   ECHOCARDIOGRAM  ECHOCARDIOGRAM COMPLETE  01/16/2024  Narrative ECHOCARDIOGRAM REPORT    Patient Name:   [name] [facility] Date of Exam: 01/16/2024 Medical Rec #:  [id number]           Height:       65.5 in Accession #:    [id number]           Weight:       132.0 lb Date of Birth:  08-21-1940            BSA:          1.667 m Patient Age:    83 years            BP:           124/66 mmHg Patient Gender: F                   HR:           61 bpm. Exam Location:  [address]  Procedure: 2D Echo (Both Spectral and Color Flow Doppler were utilized during procedure).  Indications:    R06.00 Dyspnea  History:        Patient has prior history of Echocardiogram examinations, most recent 11/13/2020. Arrythmias:PVC, Signs/Symptoms:Shortness of Breath and Fatigue; Risk Factors:Hypertension and Dyslipidemia. NSVT. Bilateral leg edema. Elevated coronary artery calcium  score.  Sonographer:    Jon Hacker RCS Referring Phys: [id number] GORDY BERGAMO  IMPRESSIONS   1. Left ventricular ejection fraction, by estimation, is 55 to 60%. The left ventricle has normal function. The left ventricle has no regional wall motion abnormalities. Left ventricular diastolic parameters were normal. 2. Right ventricular systolic function is normal. The right ventricular size is normal. 3. The mitral valve is abnormal. Mild mitral valve regurgitation. No evidence of mitral stenosis. 4. The aortic valve is tricuspid. There is mild calcification of the aortic valve. There is mild thickening of the aortic valve. Aortic valve regurgitation is not visualized. Aortic valve sclerosis is present, with no evidence of aortic valve stenosis. 5. The inferior vena cava is normal in size with greater than 50% respiratory variability, suggesting right atrial pressure of 3 mmHg.  FINDINGS Left Ventricle: Left ventricular ejection fraction, by estimation, is 55 to 60%. The left ventricle has normal function. The left ventricle has no regional wall motion abnormalities. Strain was performed and the global longitudinal strain is indeterminate. The left ventricular internal cavity size was normal in size. There is no left ventricular hypertrophy. Left ventricular diastolic parameters were  normal.  Right Ventricle: The right ventricular size is normal. No increase in right ventricular wall thickness. Right ventricular systolic function is normal.  Left Atrium: Left atrial size was normal in size.  Right Atrium: Right atrial size was normal in size.  Pericardium: There is no evidence of pericardial effusion.  Mitral Valve: The mitral valve is abnormal. There is mild thickening of the mitral valve leaflet(s). There is mild calcification of the mitral valve leaflet(s). Mild mitral valve regurgitation. No evidence of mitral valve stenosis.  Tricuspid Valve: The tricuspid valve is normal in structure. Tricuspid valve regurgitation is mild . No evidence of tricuspid stenosis.  Aortic Valve: The aortic valve is tricuspid. There is mild calcification of the aortic valve. There is mild thickening of the aortic valve. Aortic valve regurgitation is not visualized. Aortic valve sclerosis is present, with no evidence of aortic valve stenosis.  Pulmonic Valve: The pulmonic valve was normal in structure. Pulmonic valve regurgitation is trivial. No evidence of pulmonic stenosis.  Aorta: The aortic  root is normal in size and structure.  Venous: The inferior vena cava is normal in size with greater than 50% respiratory variability, suggesting right atrial pressure of 3 mmHg.  IAS/Shunts: No atrial level shunt detected by color flow Doppler.  Additional Comments: 3D was performed not requiring image post processing on an independent workstation and was indeterminate.   LEFT VENTRICLE PLAX 2D LVIDd:         4.48 cm   Diastology LVIDs:         3.31 cm   LV e' medial:    8.81 cm/s LV PW:         0.86 cm   LV E/e' medial:  9.0 LV IVS:        0.76 cm   LV e' lateral:   9.68 cm/s LVOT diam:     2.00 cm   LV E/e' lateral: 8.2 LV SV:         57 LV SV Index:   34 LVOT Area:     3.14 cm   RIGHT VENTRICLE RV Basal diam:  2.52 cm RV S prime:     9.79 cm/s TAPSE (M-mode): 2.0 cm  LEFT  ATRIUM             Index        RIGHT ATRIUM           Index LA diam:        3.60 cm 2.16 cm/m   RA Area:     13.30 cm LA Vol (A2C):   32.3 ml 19.37 ml/m  RA Volume:   29.40 ml  17.63 ml/m LA Vol (A4C):   45.4 ml 27.23 ml/m LA Biplane Vol: 38.1 ml 22.85 ml/m AORTIC VALVE LVOT Vmax:   87.00 cm/s LVOT Vmean:  52.200 cm/s LVOT VTI:    0.183 m  AORTA Ao Root diam: 2.90 cm Ao Asc diam:  3.50 cm  MITRAL VALVE MV Area (PHT): 4.41 cm    SHUNTS MV Decel Time: 172 msec    Systemic VTI:  0.18 m MV E velocity: 79.10 cm/s  Systemic Diam: 2.00 cm MV A velocity: 73.90 cm/s MV E/A ratio:  1.07  Maude Emmer MD Electronically signed by Maude Emmer MD Signature Date/Time: 01/16/2024/4:10:46 PM    Final    MONITORS  LONG TERM MONITOR (3-14 DAYS) 06/17/2021  Narrative HR 50 - 176, average 69 bpm. 2 NSVT, longest lasting 6 beats at a rate of 164 bpm. 52 SVT, longest lasting 13 beats Occasional supraventricular ectopy, 2.4% Rare ventricular ectopy No sustained arrhythmias.  Ole T. Cindie, MD, [facility], [facility] Cardiac Electrophysiology   CT SCANS  CT CARDIAC SCORING (SELF PAY ONLY) 02/11/2021  Addendum 02/11/2021  1:38 PM ADDENDUM REPORT: 02/11/2021 13:36  CLINICAL DATA:  Risk stratification  EXAM: Coronary Calcium  Score  TECHNIQUE: The patient was scanned on a Siemens Somatom 64 slice scanner. Axial non-contrast 3 mm slices were carried out through the heart. The data set was analyzed on a dedicated work station and scored using the Agatson method.  FINDINGS: Non-cardiac: See separate report from [facility].  Ascending aorta: Normal diameter 3.3 cm  Pericardium: Normal  Coronary arteries: Mild calcium  noted in proximal LAD  IMPRESSION: Coronary calcium  score of 13.9. This was 44 th percentile for age and sex matched control.  Maude Emmer   Electronically Signed By: Maude Emmer M.D. On: 02/11/2021 13:36  Narrative EXAM: OVER-READ  INTERPRETATION  CT CHEST  The following report is an over-read performed  by radiologist Dr. Toribio Aye of [facility] on 02/11/2021. This over-read does not include interpretation of cardiac or coronary anatomy or pathology. The coronary calcium  score/coronary CTA interpretation by the cardiologist is attached.  COMPARISON:  None.  FINDINGS: Atherosclerotic calcifications in the thoracic aorta. Within the visualized portions of the thorax there are no suspicious appearing pulmonary nodules or masses, there is no acute consolidative airspace disease, no pleural effusions, no pneumothorax and no lymphadenopathy. Visualized portions of the upper abdomen are unremarkable. There are no aggressive appearing lytic or blastic lesions noted in the visualized portions of the skeleton.  IMPRESSION: 1.  Aortic Atherosclerosis (ICD10-I70.0).  Electronically Signed: By: Toribio Aye M.D. On: 02/11/2021 10:32     ______________________________________________________________________________________________      Risk Assessment/Calculations           Physical Exam VS:  BP (!) 102/56 (BP Location: Right Arm, Patient Position: Sitting, Cuff Size: Normal)   Pulse 67   Resp 16   Ht 5' 5 (1.651 m)   Wt 135 lb 9.6 oz (61.5 kg)   SpO2 93%   BMI 22.57 kg/m        Wt Readings from Last 3 Encounters:  03/19/24 135 lb 9.6 oz (61.5 kg)  12/21/23 132 lb (59.9 kg)  12/09/23 133 lb (60.3 kg)    GEN: Well nourished, well developed in no acute distress NECK: No JVD; No carotid bruits CARDIAC: RRR, no murmurs, rubs, gallops RESPIRATORY:  Clear to auscultation without rales, wheezing or rhonchi  ABDOMEN: Soft, non-tender, non-distended EXTREMITIES:  No edema; No deformity   ASSESSMENT AND PLAN  Dyspnea with exertion: Symptom does not occur at rest.  Dyspnea only occurs with very strenuous activity but not with everyday activity.  Since her return from [facility], she  has not done much strenuous activity.  I recommended baseline blood work including CBC, CMP, TSH/free T4 and BNP to rule out secondary causes.  I plan to reassess the patient in 26-month, if symptom persist or worsens, may consider coronary CTA.  Previous CT of the chest obtained in January 2025 did not show significant coronary calcification.  She is euvolemic on exam today.  There is no sign of heart failure.  Recent echocardiogram was normal.  History of PVCs: On low dose metoprolol  succinate 12.5 mg daily.  Unable to further uptitrate the beta-blocker  Dizziness: Recommend increased fluid hydration.  Blood pressure borderline low today.  Symptom does not immediately started after she stand up however does start a few seconds after she stand up and walk around.       Dispo: Follow-up in 3 months  Signed, Kamoria Lucien, [place]

## 2024-03-21 DIAGNOSIS — M5412 Radiculopathy, cervical region: Secondary | ICD-10-CM | POA: Diagnosis not present

## 2024-03-21 LAB — COMPREHENSIVE METABOLIC PANEL WITH GFR
ALT: 15 IU/L (ref 0–32)
AST: 19 IU/L (ref 0–40)
Albumin: 4.8 g/dL — AB (ref 3.7–4.7)
Alkaline Phosphatase: 62 IU/L (ref 48–129)
BUN/Creatinine Ratio: 21 (ref 12–28)
BUN: 16 mg/dL (ref 8–27)
Bilirubin Total: 0.7 mg/dL (ref 0.0–1.2)
CO2: 24 mmol/L (ref 20–29)
Calcium: 9.5 mg/dL (ref 8.7–10.3)
Chloride: 102 mmol/L (ref 96–106)
Creatinine, Ser: 0.78 mg/dL (ref 0.57–1.00)
Globulin, Total: 1.8 g/dL (ref 1.5–4.5)
Glucose: 64 mg/dL — AB (ref 70–99)
Potassium: 4.4 mmol/L (ref 3.5–5.2)
Sodium: 141 mmol/L (ref 134–144)
Total Protein: 6.6 g/dL (ref 6.0–8.5)
eGFR: 76 mL/min/1.73 (ref >59)

## 2024-03-21 LAB — CBC
Hematocrit: 40.2 % (ref 34.0–46.6)
Hemoglobin: 13 g/dL (ref 11.1–15.9)
MCH: 32.8 pg (ref 26.6–33.0)
MCHC: 32.3 g/dL (ref 31.5–35.7)
MCV: 102 fL — ABNORMAL HIGH (ref 79–97)
Platelets: 140 x10E3/uL — ABNORMAL LOW (ref 150–450)
RBC: 3.96 x10E6/uL (ref 3.77–5.28)
RDW: 13 % (ref 11.7–15.4)
WBC: 7.9 x10E3/uL (ref 3.4–10.8)

## 2024-03-21 LAB — T4, FREE: Free T4: 1.15 ng/dL (ref 0.82–1.77)

## 2024-03-21 LAB — BRAIN NATRIURETIC PEPTIDE: BNP: 35.9 pg/mL (ref 0.0–100.0)

## 2024-03-21 LAB — TSH: TSH: 3.78 u[IU]/mL (ref 0.450–4.500)

## 2024-03-23 ENCOUNTER — Ambulatory Visit: Payer: Self-pay | Admitting: Physician Assistant

## 2024-03-26 DIAGNOSIS — I700 Atherosclerosis of aorta: Secondary | ICD-10-CM | POA: Diagnosis not present

## 2024-03-26 DIAGNOSIS — M0550 Rheumatoid polyneuropathy with rheumatoid arthritis of unspecified site: Secondary | ICD-10-CM | POA: Diagnosis not present

## 2024-03-26 DIAGNOSIS — D84821 Immunodeficiency due to drugs: Secondary | ICD-10-CM | POA: Diagnosis not present

## 2024-03-26 DIAGNOSIS — I4891 Unspecified atrial fibrillation: Secondary | ICD-10-CM | POA: Diagnosis not present

## 2024-03-26 DIAGNOSIS — E785 Hyperlipidemia, unspecified: Secondary | ICD-10-CM | POA: Diagnosis not present

## 2024-03-26 DIAGNOSIS — L4050 Arthropathic psoriasis, unspecified: Secondary | ICD-10-CM | POA: Diagnosis not present

## 2024-03-26 DIAGNOSIS — K219 Gastro-esophageal reflux disease without esophagitis: Secondary | ICD-10-CM | POA: Diagnosis not present

## 2024-03-26 DIAGNOSIS — I472 Ventricular tachycardia, unspecified: Secondary | ICD-10-CM | POA: Diagnosis not present

## 2024-03-26 DIAGNOSIS — M1990 Unspecified osteoarthritis, unspecified site: Secondary | ICD-10-CM | POA: Diagnosis not present

## 2024-03-29 ENCOUNTER — Ambulatory Visit: Attending: Obstetrics and Gynecology | Admitting: Physical Therapy

## 2024-03-29 DIAGNOSIS — M6281 Muscle weakness (generalized): Secondary | ICD-10-CM | POA: Diagnosis not present

## 2024-03-29 DIAGNOSIS — R279 Unspecified lack of coordination: Secondary | ICD-10-CM | POA: Diagnosis not present

## 2024-03-29 NOTE — Patient Instructions (Signed)
 Bowel movement toileting mechanics  Try to avoid straining, but if you feel the need to push, imagine you are fogging up a mirror with your breath as you exhale and gently push down and out  When you feel like you are done, but could still be stool remaining: Take 3 deep belly breaths, focusing on getting the air to your lower tummy  Try leaning forward, backward, and twisting to see if you can get more stool out

## 2024-03-29 NOTE — Therapy (Signed)
 OUTPATIENT PHYSICAL THERAPY FEMALE PELVIC TREATMENT   Patient Name: [name] MRN: [id number] DOB:1941/05/01, 83 y.o., female Today's Date: 03/29/2024  END OF SESSION:  PT End of Session - 03/29/24 0801     Visit Number 3    Number of Visits 10    Date for Recertification  04/25/24    Authorization Type [employer]    Authorization Time Period Cohere Approved 10 vls 02/15/24-05/16/24-auth#[id number]    Authorization - Visit Number 3    Authorization - Number of Visits 10    PT Start Time 0732    PT Stop Time 0801    PT Time Calculation (min) 29 min    Activity Tolerance Patient tolerated treatment well    Behavior During Therapy WFL for tasks assessed/performed           Past Medical History:  Diagnosis Date   Adrenal gland dysfunction     dr Mat, [facility] B now resolved   Anemia    iron   Arthritis    psoriatic  in hips oa in hands   Blood transfusion without reported diagnosis 1978   Cataract    chronic headaches    histamine migraines, rarely occur   Chronic kidney disease    hx bladder infections yrs ago   Colon polyp yrs ago   Dysrhythmia 1996 to 1999   wore holter monitor result of accidental fall, occasional irregular pulse   Family history of adverse reaction to anesthesia    father had aspiration after anesthesia one time   Gastric polyps    GERD (gastroesophageal reflux disease)    History of migraine headaches    Hyperlipidemia    Hypothyroidism    Neuromuscular disease (HCC)    fibromyalgia   Pneumonia 2005aspiration pneumonia   stopped when got pneumonia shot, also 2015 regular pneumonia   PONV (postoperative nausea and vomiting)    patient wishes to have no anesthetics that could affect memory due to past memory issues, no cause found   Psoriatic arthritis (HCC)    PVC (premature ventricular contraction)    Past Surgical History:  Procedure Laterality Date   ABDOMINAL HYSTERECTOMY     left ovary left and tube left    APPENDECTOMY     with hysterectomy   CARDIAC CATHETERIZATION     CATARACT EXTRACTION, BILATERAL     CHOLECYSTECTOMY     COLONOSCOPY     FINGER SURGERY     middle finger right hand   INCONTINENCE SURGERY     bladder sling   RECTOCELE REPAIR     RHINOPLASTY     SHOULDER ARTHROSCOPY WITH ROTATOR CUFF REPAIR AND SUBACROMIAL DECOMPRESSION Right 06/17/2015   Procedure: SHOULDER DIAGNOSTIC OPERATIVE ARTHROSCOPY WITH MINI-OPEN ROTATOR CUFF REPAIR AND SUBACROMIAL DECOMPRESSION;  Surgeon: Glendia Cordella Hutchinson, MD;  Location: [facility];  Service: Orthopedics;  Laterality: Right;   TONSILLECTOMY  age 65   adenoids also   TOTAL HIP ARTHROPLASTY Right 07/06/2017   Procedure: RIGHT TOTAL HIP ARTHROPLASTY ANTERIOR APPROACH;  Surgeon: Melodi Lerner, MD;  Location: [facility];  Service: Orthopedics;  Laterality: Right;   Patient Active Problem List   Diagnosis Date Noted   Bilateral lower extremity edema 12/21/2023   Essential hypertension 05/03/2023   Dehydration 05/03/2023   Elevated coronary artery calcium  score 12/24/2022   Burning sensation 05/12/2022   Routine general medical examination at a health care facility 03/19/2022   PVC's (premature ventricular contractions) 08/04/2021   Hypothyroidism 03/05/2021   Migraine 03/05/2021   Hyperlipidemia  10/16/2020   Femoral nerve lesion, right 02/16/2018   OA (osteoarthritis) of hip 07/06/2017   Cervical disc disorder with radiculopathy of cervical region 03/16/2016   Contracture of joint of right shoulder region 03/16/2016   DDD (degenerative disc disease), cervical 12/01/2015   MCI (mild cognitive impairment) 12/01/2015   Psoriatic arthritis (HCC) 11/08/2013   Adrenal gland dysfunction     PCP: Claudene Pellet, MD  REFERRING PROVIDER: Mat Browning, MD  REFERRING DIAG:  N81.6 (ICD-10-CM) - Rectocele   THERAPY DIAG:  Muscle weakness (generalized)  Unspecified lack of coordination  Rationale for Evaluation and Treatment: Rehabilitation  ONSET  DATE: 2024  SUBJECTIVE:                                                                                                                                                                                           SUBJECTIVE STATEMENT: Patient reports that she had a pinched nerve in her neck that is still present - she took a round of prednisone  for this. She was unable to do some of her exercises due to this pain but she is in PT for this now. Bowel movements have not been as bad - she thinks things are moving in the right direction. She has been using the urge drill for urinary urgency. Maybe 1 instance of leakage in the early morning when she wakes up.   Eval: Patient reports to PFPT with a rectocele that she has had before in 2005. She's had bladder sling repair and rectocele repair surgery in the past for this reason. Now, starting about a year ago, she began noticing her stool was very thin and she was having difficulty passing complete bowel movements/feeling empty. She has repetitive bowel movements throughout the day. She feels like her stool is getting caught in the rectum. Patient states that the pressure from incomplete bowel movements is rated as 5/10 or under on the discomfort scale when this happens. Fluid intake: water , milk with coffee (2 cups daily)  FUNCTIONAL LIMITATIONS: toileting and evacuation of stool   PERTINENT HISTORY:  Medications for current condition: estradiol  patch that she changes weekly, synthroid  in the mornings, Colace as needed  Surgeries: hysterectomy at age 66 Sexual abuse: No  PAIN:  Are you having pain? No NPRS scale: 0/10  PRECAUTIONS: None  RED FLAGS: None   WEIGHT BEARING RESTRICTIONS: No  FALLS:  Has patient fallen in last 6 months? No  OCCUPATION: retired   ACTIVITY LEVEL : usually on her feet throughout the day, walks 2 miles 3 days a week, trying to increase this   PLOF: Independent  PATIENT GOALS: to have more  complete bowel movements  and have less discomfort   BOWEL MOVEMENT: Pain with bowel movement: No Type of bowel movement:Type (Bristol Stool Scale) 4-5, Frequency 1-5x/day, most days it is 3-5x/day, Strain no, and Splinting PRN Fully empty rectum: No Leakage: No                                                     Caused by:  Pads: No Fiber supplement/laxative Yes prunes and colace   URINATION: Pain with urination: No Fully empty bladder: Yes:                                  Post-void dribble: No Stream: Strong Urgency: Yes  Frequency:during the day within normal limits                                                          Nocturia: No   Leakage: none Pads/briefs: No  PROLAPSE: Pressure in the posterior aspect of vaginal canal   OBJECTIVE:  Note: Objective measures were completed at Evaluation unless otherwise noted.  PATIENT SURVEYS:  PFIQ-7: 61  COGNITION: Overall cognitive status: Within functional limits for tasks assessed     SENSATION: Light touch: Appears intact  LUMBAR SPECIAL TESTS:  Single leg stance test: Positive  FUNCTIONAL TESTS:  Single leg stance:  Rt: +  Lt: + Sit-up test: 1/3 Squat: general lumbopelvic stiffness present with  Bed mobility: within normal limits   GAIT: Assistive device utilized: None Comments: mild trendelenburg gait pattern with ambulation   POSTURE: rounded shoulders, forward head, and flexed trunk   LUMBARAROM/PROM: within normal limits for all motions tested bilaterally with no pain   LOWER EXTREMITY ROM: within normal limits for all motions tested bilaterally with no pain   LOWER EXTREMITY MMT: 4/5 bilateral knees and hips grossly   PALPATION:  General: no tenderness to hip flexor palpation or adductor palpation   Pelvic Alignment: within normal limits   Abdominal: abdominal bracing at rest   Diastasis: No Distortion: No  Breathing: upper chest breathing and abdominal bracing at rest with decreased lower rib excursion with inhalation   Scar tissue: Yes: surgical                 External Perineal Exam: dryness present with decreased tissue bulk in superficial pelvic floor musculature                              Internal Pelvic Floor: Patient fully consents to today's internal examination. She demonstrates low tone and no palpable trigger points throughout superficial and deep pelvic floor musculature. She has a weak pelvic floor contraction in supine that improves when paired with an exhale to achieve more normal range of motion. She demonstrates posterior vaginal wall laxity with cough test in supine. No pain during or following today's exam.  Patient confirms identification and approves PT to assess internal pelvic floor and treatment Yes No emotional/communication barriers or cognitive limitation. Patient is motivated to learn. Patient understands and agrees with treatment goals and plan.  PT explains patient will be examined in standing, sitting, and lying down to see how their muscles and joints work. When they are ready, they will be asked to remove their underwear so PT can examine their perineum. The patient is also given the option of providing their own chaperone as one is not provided in our facility. The patient also has the right and is explained the right to defer or refuse any part of the evaluation or treatment including the internal exam. With the patient's consent, PT will use one gloved finger to gently assess the muscles of the pelvic floor, seeing how well it contracts and relaxes and if there is muscle symmetry. After, the patient will get dressed and PT and patient will discuss exam findings and plan of care. PT and patient discuss plan of care, schedule, attendance policy and HEP activities.  PELVIC MMT:   MMT eval  Vaginal 3/5, 6 quick flicks, 2 second hold   Internal Anal Sphincter   External Anal Sphincter   Puborectalis   Diastasis Recti   (Blank rows = not tested)       TONE: Low in bilateral  aspects of superficial and deep pelvic floor musculature   PROLAPSE: Mild Posterior vaginal wall laxity present with cough prolapse test in hooklying   TODAY'S TREATMENT:                                                                                                                              DATE:   EVAL 02/15/24: Examination completed, findings reviewed, pt educated on POC, HEP, and self care. Pt motivated to participate in PT and agreeable to attempt recommendations.   Hooklying diaphragmatic breathing + pelvic floor contraction 2x10  Hooklying diaphragmatic breathing + pelvic floor quick flick contractions   02/21/24: Seated pelvic floor contraction + diaphragmatic breathing 2x10  Seated pelvic floor quick flick contractions + diaphragmatic breathing 2x10  Toileting mechanics for optimal emptying of bowels/bladder  Blow as you go technique  Urge drill for managing urge urinary incontinence   03/29/24: Toileting mechanics for optimal bowel emptying  Blow as you go technique  Potty dance  Deep diaphragmatic breathing  Sit to stand + adductor ball squeeze + diaphragmatic breathing 2x10  Seated hip abduction (GTB) + diaphragmatic breathing 2x10   PATIENT EDUCATION:  Education details: relative anatomy and the connection between the diaphragm and pelvic floor, how water  intake and bladder irritants affect the pelvis,  Person educated: Patient Education method: Explanation, Demonstration, Tactile cues, Verbal cues, and Handouts Education comprehension: verbalized understanding, returned demonstration, verbal cues required, tactile cues required, and needs further education  HOME EXERCISE PROGRAM: Access Code: [email or web address] URL: [email or web address] Date: 03/29/2024 Prepared by: Celena Domino  Exercises - Seated Pelvic Floor Contraction  - 1 x daily - 7 x weekly - 2 sets - 10 reps - Seated Quick Flick Pelvic Floor Contractions  - 1 x daily - 7 x weekly - 2 sets - 10  reps - Sit to Stand with Mercer Between Knees  - 1 x daily - 7 x weekly - 2 sets - 10 reps - Seated Hip Abduction with Resistance  - 1 x daily - 7 x weekly - 2 sets - 10 reps  Patient Education - Get To Know Your Pelvic Floor- Female - High-Fiber Diet to Support Pelvic Health - Bowel Emptying Techniques  ASSESSMENT:  CLINICAL IMPRESSION: Patient is a 83 y.o. female  who was seen today for physical therapy treatment for rectocele treatment. Patient has been consistent with HEP and reports understanding of all exercise progressions today. Toileting mechanics reviewed and pt provided banded hip strengthening exercises which she tolerated well with no pain. Overall, patient tolerated evaluation well and Pt would benefit from additional PT to further address deficits.    OBJECTIVE IMPAIRMENTS: decreased coordination, decreased endurance, decreased mobility, decreased ROM, decreased strength, and pain.   ACTIVITY LIMITATIONS: continence and toileting  PARTICIPATION LIMITATIONS: complete bowel movements   PERSONAL FACTORS: Age, Past/current experiences, and Time since onset of injury/illness/exacerbation are also affecting patient's functional outcome.   REHAB POTENTIAL: Good  CLINICAL DECISION MAKING: Stable/uncomplicated  EVALUATION COMPLEXITY: Low   GOALS: Goals reviewed with patient? Yes  SHORT TERM GOALS: Target date: 03/14/2024  Pt will be independent with HEP.  Baseline: Goal status: INITIAL  2.  Pt will be able to correctly perform diaphragmatic breathing and appropriate pressure management in order to prevent worsening vaginal wall laxity and improve pelvic floor A/ROM.  Baseline:  Goal status: INITIAL  3.  Pt will be independent with use of squatty potty, relaxed toileting mechanics, and improved bowel movement techniques in order to increase ease of bowel movements and complete evacuation.  Baseline:  Goal status: INITIAL  LONG TERM GOALS: Target date: 08/14/2024  Pt  will be independent with advanced HEP.  Baseline:  Goal status: INITIAL  2.  Pt to demonstrate improved coordination of pelvic floor and breathing mechanics with 10# squat with appropriate synergistic patterns to decrease rectal pressure at least 75% of the time for improved ability to complete a 30 minute workout with strain at pelvic floor and symptoms.   Baseline:  Goal status: INITIAL  3.  Pt will report her BMs are complete due to improved bowel habits and evacuation techniques.  Baseline:  Goal status: INITIAL  4.  Pt will demonstrate 3+/5 pelvic floor muscle strength with appropriate coordination in order to decrease urinary incontinence, urgency and frequency.   Baseline: 3 with improper coordination  Goal status: INITIAL  PLAN:  PT FREQUENCY: 1-2x/week  PT DURATION: other: 3 months   PLANNED INTERVENTIONS: 97110-Therapeutic exercises, 97530- Therapeutic activity, 97112- Neuromuscular re-education, 97535- Self Care, [id number]- Manual therapy, Patient/Family education, Taping, Joint mobilization, Spinal mobilization, Scar mobilization, Cryotherapy, and Moist heat  PLAN FOR NEXT SESSION: continued pelvic floor AROM training in seated and varying positions, introduce hip strengthening and core strengthening, gluteal strengthening, toileting mechanics for optimal emptying, re-coordination of toileting mechanics with defecation   Celena JAYSON Domino, PT 03/29/2024, 8:02 AM [facility] [address], [address] [place], [place] [address] Phone # [phone] Fax [phone]

## 2024-03-30 DIAGNOSIS — M5412 Radiculopathy, cervical region: Secondary | ICD-10-CM | POA: Diagnosis not present

## 2024-04-02 DIAGNOSIS — M5412 Radiculopathy, cervical region: Secondary | ICD-10-CM | POA: Diagnosis not present

## 2024-04-03 DIAGNOSIS — G629 Polyneuropathy, unspecified: Secondary | ICD-10-CM | POA: Diagnosis not present

## 2024-04-03 DIAGNOSIS — Z79899 Other long term (current) drug therapy: Secondary | ICD-10-CM | POA: Diagnosis not present

## 2024-04-03 DIAGNOSIS — M7918 Myalgia, other site: Secondary | ICD-10-CM | POA: Diagnosis not present

## 2024-04-03 DIAGNOSIS — D649 Anemia, unspecified: Secondary | ICD-10-CM | POA: Diagnosis not present

## 2024-04-03 DIAGNOSIS — M150 Primary generalized (osteo)arthritis: Secondary | ICD-10-CM | POA: Diagnosis not present

## 2024-04-03 DIAGNOSIS — D696 Thrombocytopenia, unspecified: Secondary | ICD-10-CM | POA: Diagnosis not present

## 2024-04-03 DIAGNOSIS — L4050 Arthropathic psoriasis, unspecified: Secondary | ICD-10-CM | POA: Diagnosis not present

## 2024-04-06 DIAGNOSIS — M5412 Radiculopathy, cervical region: Secondary | ICD-10-CM | POA: Diagnosis not present

## 2024-04-09 DIAGNOSIS — M5412 Radiculopathy, cervical region: Secondary | ICD-10-CM | POA: Diagnosis not present

## 2024-04-11 ENCOUNTER — Ambulatory Visit: Admitting: Physical Therapy

## 2024-04-11 DIAGNOSIS — M6281 Muscle weakness (generalized): Secondary | ICD-10-CM

## 2024-04-11 DIAGNOSIS — R279 Unspecified lack of coordination: Secondary | ICD-10-CM

## 2024-04-11 NOTE — Therapy (Signed)
 OUTPATIENT PHYSICAL THERAPY FEMALE PELVIC TREATMENT   Patient Name: [name] MRN: [id number] DOB:08-08-1940, 83 y.o., female Today's Date: 04/11/2024  END OF SESSION:  PT End of Session - 04/11/24 1012     Visit Number 4    Number of Visits 10    Date for Recertification  04/25/24    Authorization Type [employer]    Authorization Time Period Cohere Approved 10 vls 02/15/24-05/16/24-auth#[id number]    Authorization - Visit Number 4    Authorization - Number of Visits 10    PT Start Time 0930    PT Stop Time 1012    PT Time Calculation (min) 42 min    Activity Tolerance Patient tolerated treatment well    Behavior During Therapy WFL for tasks assessed/performed            Past Medical History:  Diagnosis Date   Adrenal gland dysfunction     dr Mat, [facility] B now resolved   Anemia    iron   Arthritis    psoriatic  in hips oa in hands   Blood transfusion without reported diagnosis 1978   Cataract    chronic headaches    histamine migraines, rarely occur   Chronic kidney disease    hx bladder infections yrs ago   Colon polyp yrs ago   Dysrhythmia 1996 to 1999   wore holter monitor result of accidental fall, occasional irregular pulse   Family history of adverse reaction to anesthesia    father had aspiration after anesthesia one time   Gastric polyps    GERD (gastroesophageal reflux disease)    History of migraine headaches    Hyperlipidemia    Hypothyroidism    Neuromuscular disease (HCC)    fibromyalgia   Pneumonia 2005aspiration pneumonia   stopped when got pneumonia shot, also 2015 regular pneumonia   PONV (postoperative nausea and vomiting)    patient wishes to have no anesthetics that could affect memory due to past memory issues, no cause found   Psoriatic arthritis (HCC)    PVC (premature ventricular contraction)    Past Surgical History:  Procedure Laterality Date   ABDOMINAL HYSTERECTOMY     left ovary left and tube left    APPENDECTOMY     with hysterectomy   CARDIAC CATHETERIZATION     CATARACT EXTRACTION, BILATERAL     CHOLECYSTECTOMY     COLONOSCOPY     FINGER SURGERY     middle finger right hand   INCONTINENCE SURGERY     bladder sling   RECTOCELE REPAIR     RHINOPLASTY     SHOULDER ARTHROSCOPY WITH ROTATOR CUFF REPAIR AND SUBACROMIAL DECOMPRESSION Right 06/17/2015   Procedure: SHOULDER DIAGNOSTIC OPERATIVE ARTHROSCOPY WITH MINI-OPEN ROTATOR CUFF REPAIR AND SUBACROMIAL DECOMPRESSION;  Surgeon: Glendia Cordella Hutchinson, MD;  Location: [facility];  Service: Orthopedics;  Laterality: Right;   TONSILLECTOMY  age 70   adenoids also   TOTAL HIP ARTHROPLASTY Right 07/06/2017   Procedure: RIGHT TOTAL HIP ARTHROPLASTY ANTERIOR APPROACH;  Surgeon: Melodi Lerner, MD;  Location: [facility];  Service: Orthopedics;  Laterality: Right;   Patient Active Problem List   Diagnosis Date Noted   Bilateral lower extremity edema 12/21/2023   Essential hypertension 05/03/2023   Dehydration 05/03/2023   Elevated coronary artery calcium  score 12/24/2022   Burning sensation 05/12/2022   Routine general medical examination at a health care facility 03/19/2022   PVC's (premature ventricular contractions) 08/04/2021   Hypothyroidism 03/05/2021   Migraine 03/05/2021  Hyperlipidemia 10/16/2020   Femoral nerve lesion, right 02/16/2018   OA (osteoarthritis) of hip 07/06/2017   Cervical disc disorder with radiculopathy of cervical region 03/16/2016   Contracture of joint of right shoulder region 03/16/2016   DDD (degenerative disc disease), cervical 12/01/2015   MCI (mild cognitive impairment) 12/01/2015   Psoriatic arthritis (HCC) 11/08/2013   Adrenal gland dysfunction     PCP: Claudene Pellet, MD  REFERRING PROVIDER: Mat Browning, MD  REFERRING DIAG:  N81.6 (ICD-10-CM) - Rectocele   THERAPY DIAG:  Unspecified lack of coordination  Muscle weakness (generalized)  Rationale for Evaluation and Treatment: Rehabilitation  ONSET  DATE: 2024  SUBJECTIVE:                                                                                                                                                                                           SUBJECTIVE STATEMENT: Patient reports that her neck is still bothering her significantly. She has been feeling this pain in the right shoulder and into the ear - this pain has been improving but it is not gone yet. She reports that bowel movements have been okay - the breathing techniques we discussed last week help with moving the stool down, but not helpful with elimination of stool. She doesn't feel constipated, the stool is soft and regular sized. Urinary leakage is much better. She has rectal pressure today that she feels - no vaginal pressure.   Eval: Patient reports to PFPT with a rectocele that she has had before in 2005. She's had bladder sling repair and rectocele repair surgery in the past for this reason. Now, starting about a year ago, she began noticing her stool was very thin and she was having difficulty passing complete bowel movements/feeling empty. She has repetitive bowel movements throughout the day. She feels like her stool is getting caught in the rectum. Patient states that the pressure from incomplete bowel movements is rated as 5/10 or under on the discomfort scale when this happens. Fluid intake: water , milk with coffee (2 cups daily)  FUNCTIONAL LIMITATIONS: toileting and evacuation of stool   PERTINENT HISTORY:  Medications for current condition: estradiol  patch that she changes weekly, synthroid  in the mornings, Colace as needed  Surgeries: hysterectomy at age 83 Sexual abuse: No  PAIN:  Are you having pain? No NPRS scale: 0/10  PRECAUTIONS: None  RED FLAGS: None   WEIGHT BEARING RESTRICTIONS: No  FALLS:  Has patient fallen in last 6 months? No  OCCUPATION: retired   ACTIVITY LEVEL : usually on her feet throughout the day, walks 2 miles 3 days a  week, trying to increase this  PLOF: Independent  PATIENT GOALS: to have more complete bowel movements and have less discomfort   BOWEL MOVEMENT: Pain with bowel movement: No Type of bowel movement:Type (Bristol Stool Scale) 4-5, Frequency 1-5x/day, most days it is 3-5x/day, Strain no, and Splinting PRN Fully empty rectum: No Leakage: No                                                     Caused by:  Pads: No Fiber supplement/laxative Yes prunes and colace   URINATION: Pain with urination: No Fully empty bladder: Yes:                                  Post-void dribble: No Stream: Strong Urgency: Yes  Frequency:during the day within normal limits                                                          Nocturia: No   Leakage: none Pads/briefs: No  PROLAPSE: Pressure in the posterior aspect of vaginal canal   OBJECTIVE:  Note: Objective measures were completed at Evaluation unless otherwise noted.  PATIENT SURVEYS:  PFIQ-7: 54  COGNITION: Overall cognitive status: Within functional limits for tasks assessed     SENSATION: Light touch: Appears intact  LUMBAR SPECIAL TESTS:  Single leg stance test: Positive  FUNCTIONAL TESTS:  Single leg stance:  Rt: +  Lt: + Sit-up test: 1/3 Squat: general lumbopelvic stiffness present with  Bed mobility: within normal limits   GAIT: Assistive device utilized: None Comments: mild trendelenburg gait pattern with ambulation   POSTURE: rounded shoulders, forward head, and flexed trunk   LUMBARAROM/PROM: within normal limits for all motions tested bilaterally with no pain   LOWER EXTREMITY ROM: within normal limits for all motions tested bilaterally with no pain   LOWER EXTREMITY MMT: 4/5 bilateral knees and hips grossly   PALPATION:  General: no tenderness to hip flexor palpation or adductor palpation   Pelvic Alignment: within normal limits   Abdominal: abdominal bracing at rest   Diastasis: No Distortion: No   Breathing: upper chest breathing and abdominal bracing at rest with decreased lower rib excursion with inhalation  Scar tissue: Yes: surgical                 External Perineal Exam: dryness present with decreased tissue bulk in superficial pelvic floor musculature                              Internal Pelvic Floor: Patient fully consents to today's internal examination. She demonstrates low tone and no palpable trigger points throughout superficial and deep pelvic floor musculature. She has a weak pelvic floor contraction in supine that improves when paired with an exhale to achieve more normal range of motion. She demonstrates posterior vaginal wall laxity with cough test in supine. No pain during or following today's exam.  Patient confirms identification and approves PT to assess internal pelvic floor and treatment Yes No emotional/communication barriers or cognitive limitation. Patient is motivated to learn. Patient understands  and agrees with treatment goals and plan. PT explains patient will be examined in standing, sitting, and lying down to see how their muscles and joints work. When they are ready, they will be asked to remove their underwear so PT can examine their perineum. The patient is also given the option of providing their own chaperone as one is not provided in our facility. The patient also has the right and is explained the right to defer or refuse any part of the evaluation or treatment including the internal exam. With the patient's consent, PT will use one gloved finger to gently assess the muscles of the pelvic floor, seeing how well it contracts and relaxes and if there is muscle symmetry. After, the patient will get dressed and PT and patient will discuss exam findings and plan of care. PT and patient discuss plan of care, schedule, attendance policy and HEP activities.  PELVIC MMT:   MMT eval  Vaginal 3/5, 6 quick flicks, 2 second hold   Internal Anal Sphincter   External  Anal Sphincter   Puborectalis   Diastasis Recti   (Blank rows = not tested)       TONE: Low in bilateral aspects of superficial and deep pelvic floor musculature   PROLAPSE: Mild Posterior vaginal wall laxity present with cough prolapse test in hooklying   TODAY'S TREATMENT:                                                                                                                              DATE:   EVAL 02/15/24: Examination completed, findings reviewed, pt educated on POC, HEP, and self care. Pt motivated to participate in PT and agreeable to attempt recommendations.   Hooklying diaphragmatic breathing + pelvic floor contraction 2x10  Hooklying diaphragmatic breathing + pelvic floor quick flick contractions   02/21/24: Seated pelvic floor contraction + diaphragmatic breathing 2x10  Seated pelvic floor quick flick contractions + diaphragmatic breathing 2x10  Toileting mechanics for optimal emptying of bowels/bladder  Blow as you go technique  Urge drill for managing urge urinary incontinence   03/29/24: Toileting mechanics for optimal bowel emptying  Blow as you go technique  Potty dance  Deep diaphragmatic breathing  Sit to stand + adductor ball squeeze + diaphragmatic breathing 2x10  Seated hip abduction (GTB) + diaphragmatic breathing 2x10   04/11/24: Bridge + pelvic floor contraction + diaphragmatic breathing 2x10  Bridge + adductor ball squeeze + diaphragmatic breathing 2x10 Sit to stand + hip abduction (GTB) + diaphragmatic breathing 2x10  Seated clamshell (GTB) + diaphragmatic breathing 2x10  Seated march (GTB) + diaphragmatic breathing 2x10    PATIENT EDUCATION:  Education details: relative anatomy and the connection between the diaphragm and pelvic floor, how water  intake and bladder irritants affect the pelvis,  Person educated: Patient Education method: Explanation, Demonstration, Tactile cues, Verbal cues, and Handouts Education comprehension: verbalized  understanding, returned demonstration, verbal cues required, tactile cues required, and needs further education  HOME EXERCISE PROGRAM: Access Code: [email or web address] URL: [email or web address] Date: 03/29/2024 Prepared by: Celena Domino  Exercises - Seated Pelvic Floor Contraction  - 1 x daily - 7 x weekly - 2 sets - 10 reps - Seated Quick Flick Pelvic Floor Contractions  - 1 x daily - 7 x weekly - 2 sets - 10 reps - Sit to Stand with Mercer Between Knees  - 1 x daily - 7 x weekly - 2 sets - 10 reps - Seated Hip Abduction with Resistance  - 1 x daily - 7 x weekly - 2 sets - 10 reps  Patient Education - Get To Know Your Pelvic Floor- Female - High-Fiber Diet to Support Pelvic Health - Bowel Emptying Techniques  ASSESSMENT:  CLINICAL IMPRESSION: Patient is a 83 y.o. female  who was seen today for physical therapy treatment for rectocele treatment. Pt has been noticing improvements in bowel movements and is experiencing less rectal pressure overall, but it is present today. We reviewed bridging exercise to assist during times of prolapse flare. We also reviewed the urge drill for urinary urgency management. She tolerated addition of all gluteal and core strengthening exercises very well today. Overall, patient tolerated evaluation well and Pt would benefit from additional PT to further address deficits.    OBJECTIVE IMPAIRMENTS: decreased coordination, decreased endurance, decreased mobility, decreased ROM, decreased strength, and pain.   ACTIVITY LIMITATIONS: continence and toileting  PARTICIPATION LIMITATIONS: complete bowel movements   PERSONAL FACTORS: Age, Past/current experiences, and Time since onset of injury/illness/exacerbation are also affecting patient's functional outcome.   REHAB POTENTIAL: Good  CLINICAL DECISION MAKING: Stable/uncomplicated  EVALUATION COMPLEXITY: Low   GOALS: Goals reviewed with patient? Yes  SHORT TERM GOALS: Target date: 03/14/2024  Pt  will be independent with HEP.  Baseline: Goal status: INITIAL  2.  Pt will be able to correctly perform diaphragmatic breathing and appropriate pressure management in order to prevent worsening vaginal wall laxity and improve pelvic floor A/ROM.  Baseline:  Goal status: INITIAL  3.  Pt will be independent with use of squatty potty, relaxed toileting mechanics, and improved bowel movement techniques in order to increase ease of bowel movements and complete evacuation.  Baseline:  Goal status: INITIAL  LONG TERM GOALS: Target date: 08/14/2024  Pt will be independent with advanced HEP.  Baseline:  Goal status: INITIAL  2.  Pt to demonstrate improved coordination of pelvic floor and breathing mechanics with 10# squat with appropriate synergistic patterns to decrease rectal pressure at least 75% of the time for improved ability to complete a 30 minute workout with strain at pelvic floor and symptoms.   Baseline:  Goal status: INITIAL  3.  Pt will report her BMs are complete due to improved bowel habits and evacuation techniques.  Baseline:  Goal status: INITIAL  4.  Pt will demonstrate 3+/5 pelvic floor muscle strength with appropriate coordination in order to decrease urinary incontinence, urgency and frequency.   Baseline: 3 with improper coordination  Goal status: INITIAL  PLAN:  PT FREQUENCY: 1-2x/week  PT DURATION: other: 3 months   PLANNED INTERVENTIONS: 97110-Therapeutic exercises, 97530- Therapeutic activity, 97112- Neuromuscular re-education, 97535- Self Care, [id number]- Manual therapy, Patient/Family education, Taping, Joint mobilization, Spinal mobilization, Scar mobilization, Cryotherapy, and Moist heat  PLAN FOR NEXT SESSION: continued pelvic floor AROM training in seated and varying positions, introduce hip strengthening and core strengthening, gluteal strengthening, toileting mechanics for optimal emptying, re-coordination of toileting mechanics with defecation   Celena JAYSON Domino, PT 04/11/2024, 10:12 AM  [facility] [address] [place], [place] [address] Phone # [phone] Fax [phone]

## 2024-04-13 DIAGNOSIS — M5412 Radiculopathy, cervical region: Secondary | ICD-10-CM | POA: Diagnosis not present

## 2024-04-16 DIAGNOSIS — M5412 Radiculopathy, cervical region: Secondary | ICD-10-CM | POA: Diagnosis not present

## 2024-04-17 ENCOUNTER — Encounter: Admitting: Physical Therapy

## 2024-04-17 DIAGNOSIS — M4802 Spinal stenosis, cervical region: Secondary | ICD-10-CM | POA: Diagnosis not present

## 2024-04-17 DIAGNOSIS — M542 Cervicalgia: Secondary | ICD-10-CM | POA: Diagnosis not present

## 2024-04-17 DIAGNOSIS — M5010 Cervical disc disorder with radiculopathy, unspecified cervical region: Secondary | ICD-10-CM | POA: Diagnosis not present

## 2024-04-25 ENCOUNTER — Ambulatory Visit: Attending: Obstetrics and Gynecology | Admitting: Physical Therapy

## 2024-04-25 DIAGNOSIS — R279 Unspecified lack of coordination: Secondary | ICD-10-CM | POA: Insufficient documentation

## 2024-04-25 DIAGNOSIS — R293 Abnormal posture: Secondary | ICD-10-CM | POA: Diagnosis present

## 2024-04-25 DIAGNOSIS — M6281 Muscle weakness (generalized): Secondary | ICD-10-CM | POA: Insufficient documentation

## 2024-04-25 NOTE — Therapy (Signed)
 OUTPATIENT PHYSICAL THERAPY FEMALE PELVIC TREATMENT   Patient Name: [name] MRN: [id number] DOB:1940/12/29, 83 y.o., female Today's Date: 04/25/2024  END OF SESSION:  PT End of Session - 04/25/24 1145     Visit Number 5    Number of Visits 10    Date for Recertification  04/25/24    Authorization Type [employer]    Authorization Time Period Cohere Approved 10 vls 02/15/24-05/16/24-auth#[id number]    Authorization - Visit Number 5    Authorization - Number of Visits 10    PT Start Time 1100    PT Stop Time 1148    PT Time Calculation (min) 48 min    Activity Tolerance Patient tolerated treatment well    Behavior During Therapy WFL for tasks assessed/performed             Past Medical History:  Diagnosis Date   Adrenal gland dysfunction     dr Mat, [facility] B now resolved   Anemia    iron   Arthritis    psoriatic  in hips oa in hands   Blood transfusion without reported diagnosis 1978   Cataract    chronic headaches    histamine migraines, rarely occur   Chronic kidney disease    hx bladder infections yrs ago   Colon polyp yrs ago   Dysrhythmia 1996 to 1999   wore holter monitor result of accidental fall, occasional irregular pulse   Family history of adverse reaction to anesthesia    father had aspiration after anesthesia one time   Gastric polyps    GERD (gastroesophageal reflux disease)    History of migraine headaches    Hyperlipidemia    Hypothyroidism    Neuromuscular disease (HCC)    fibromyalgia   Pneumonia 2005aspiration pneumonia   stopped when got pneumonia shot, also 2015 regular pneumonia   PONV (postoperative nausea and vomiting)    patient wishes to have no anesthetics that could affect memory due to past memory issues, no cause found   Psoriatic arthritis (HCC)    PVC (premature ventricular contraction)    Past Surgical History:  Procedure Laterality Date   ABDOMINAL HYSTERECTOMY     left ovary left and tube left    APPENDECTOMY     with hysterectomy   CARDIAC CATHETERIZATION     CATARACT EXTRACTION, BILATERAL     CHOLECYSTECTOMY     COLONOSCOPY     FINGER SURGERY     middle finger right hand   INCONTINENCE SURGERY     bladder sling   RECTOCELE REPAIR     RHINOPLASTY     SHOULDER ARTHROSCOPY WITH ROTATOR CUFF REPAIR AND SUBACROMIAL DECOMPRESSION Right 06/17/2015   Procedure: SHOULDER DIAGNOSTIC OPERATIVE ARTHROSCOPY WITH MINI-OPEN ROTATOR CUFF REPAIR AND SUBACROMIAL DECOMPRESSION;  Surgeon: Glendia Cordella Hutchinson, MD;  Location: [facility] OR;  Service: Orthopedics;  Laterality: Right;   TONSILLECTOMY  age 81   adenoids also   TOTAL HIP ARTHROPLASTY Right 07/06/2017   Procedure: RIGHT TOTAL HIP ARTHROPLASTY ANTERIOR APPROACH;  Surgeon: Melodi Lerner, MD;  Location: [facility];  Service: Orthopedics;  Laterality: Right;   Patient Active Problem List   Diagnosis Date Noted   Bilateral lower extremity edema 12/21/2023   Essential hypertension 05/03/2023   Dehydration 05/03/2023   Elevated coronary artery calcium  score 12/24/2022   Burning sensation 05/12/2022   Routine general medical examination at a health care facility 03/19/2022   PVC's (premature ventricular contractions) 08/04/2021   Hypothyroidism 03/05/2021   Migraine 03/05/2021  Hyperlipidemia 10/16/2020   Femoral nerve lesion, right 02/16/2018   OA (osteoarthritis) of hip 07/06/2017   Cervical disc disorder with radiculopathy of cervical region 03/16/2016   Contracture of joint of right shoulder region 03/16/2016   DDD (degenerative disc disease), cervical 12/01/2015   MCI (mild cognitive impairment) 12/01/2015   Psoriatic arthritis (HCC) 11/08/2013   Adrenal gland dysfunction     PCP: Claudene Pellet, MD  REFERRING PROVIDER: Mat Browning, MD  REFERRING DIAG:  N81.6 (ICD-10-CM) - Rectocele   THERAPY DIAG:  Unspecified lack of coordination  Muscle weakness (generalized)  Rationale for Evaluation and Treatment: Rehabilitation  ONSET  DATE: 2024  SUBJECTIVE:                                                                                                                                                                                           SUBJECTIVE STATEMENT: Patient reports that she is doing well today. She has an MRI next week to learn more about her neck pain. Due to her neck pain, she only did a couple rounds of the bridge exercise at home. Her bowel movements never feel finished, and she has no issue with constipation whatsoever. Rectal pressure occurs but it is not constant or bad. Urinary leakage was worse this past week because she was not consistent with her exercises. She had 1 or 2 times in the past week where she didn't make it to the bathroom in time. She has tried using the [employer] before and she does not prefer it.   Eval: Patient reports to PFPT with a rectocele that she has had before in 2005. She's had bladder sling repair and rectocele repair surgery in the past for this reason. Now, starting about a year ago, she began noticing her stool was very thin and she was having difficulty passing complete bowel movements/feeling empty. She has repetitive bowel movements throughout the day. She feels like her stool is getting caught in the rectum. Patient states that the pressure from incomplete bowel movements is rated as 5/10 or under on the discomfort scale when this happens. Fluid intake: water , milk with coffee (2 cups daily)  FUNCTIONAL LIMITATIONS: toileting and evacuation of stool   PERTINENT HISTORY:  Medications for current condition: estradiol  patch that she changes weekly, synthroid  in the mornings, Colace as needed  Surgeries: hysterectomy at age 35 Sexual abuse: No  PAIN:  Are you having pain? No NPRS scale: 0/10  PRECAUTIONS: None  RED FLAGS: None   WEIGHT BEARING RESTRICTIONS: No  FALLS:  Has patient fallen in last 6 months? No  OCCUPATION: retired   ACTIVITY LEVEL : usually  on  her feet throughout the day, walks 2 miles 3 days a week, trying to increase this   PLOF: Independent  PATIENT GOALS: to have more complete bowel movements and have less discomfort   BOWEL MOVEMENT: Pain with bowel movement: No Type of bowel movement:Type (Bristol Stool Scale) 4-5, Frequency 1-5x/day, most days it is 3-5x/day, Strain no, and Splinting PRN Fully empty rectum: No Leakage: No                                                     Caused by:  Pads: No Fiber supplement/laxative Yes prunes and colace   URINATION: Pain with urination: No Fully empty bladder: Yes:                                  Post-void dribble: No Stream: Strong Urgency: Yes  Frequency:during the day within normal limits                                                          Nocturia: No   Leakage: none Pads/briefs: No  PROLAPSE: Pressure in the posterior aspect of vaginal canal   OBJECTIVE:  Note: Objective measures were completed at Evaluation unless otherwise noted.  PATIENT SURVEYS:  PFIQ-7: 67  COGNITION: Overall cognitive status: Within functional limits for tasks assessed     SENSATION: Light touch: Appears intact  LUMBAR SPECIAL TESTS:  Single leg stance test: Positive  FUNCTIONAL TESTS:  Single leg stance:  Rt: +  Lt: + Sit-up test: 1/3 Squat: general lumbopelvic stiffness present with  Bed mobility: within normal limits   GAIT: Assistive device utilized: None Comments: mild trendelenburg gait pattern with ambulation   POSTURE: rounded shoulders, forward head, and flexed trunk   LUMBARAROM/PROM: within normal limits for all motions tested bilaterally with no pain   LOWER EXTREMITY ROM: within normal limits for all motions tested bilaterally with no pain   LOWER EXTREMITY MMT: 4/5 bilateral knees and hips grossly   PALPATION:  General: no tenderness to hip flexor palpation or adductor palpation   Pelvic Alignment: within normal limits   Abdominal: abdominal bracing  at rest   Diastasis: No Distortion: No  Breathing: upper chest breathing and abdominal bracing at rest with decreased lower rib excursion with inhalation  Scar tissue: Yes: surgical                 External Perineal Exam: dryness present with decreased tissue bulk in superficial pelvic floor musculature                              Internal Pelvic Floor: Patient fully consents to today's internal examination. She demonstrates low tone and no palpable trigger points throughout superficial and deep pelvic floor musculature. She has a weak pelvic floor contraction in supine that improves when paired with an exhale to achieve more normal range of motion. She demonstrates posterior vaginal wall laxity with cough test in supine. No pain during or following today's exam.  Patient confirms identification and approves PT to  assess internal pelvic floor and treatment Yes No emotional/communication barriers or cognitive limitation. Patient is motivated to learn. Patient understands and agrees with treatment goals and plan. PT explains patient will be examined in standing, sitting, and lying down to see how their muscles and joints work. When they are ready, they will be asked to remove their underwear so PT can examine their perineum. The patient is also given the option of providing their own chaperone as one is not provided in our facility. The patient also has the right and is explained the right to defer or refuse any part of the evaluation or treatment including the internal exam. With the patient's consent, PT will use one gloved finger to gently assess the muscles of the pelvic floor, seeing how well it contracts and relaxes and if there is muscle symmetry. After, the patient will get dressed and PT and patient will discuss exam findings and plan of care. PT and patient discuss plan of care, schedule, attendance policy and HEP activities.  PELVIC MMT:   MMT eval  Vaginal 3/5, 6 quick flicks, 2 second  hold   Internal Anal Sphincter   External Anal Sphincter   Puborectalis   Diastasis Recti   (Blank rows = not tested)       TONE: Low in bilateral aspects of superficial and deep pelvic floor musculature   PROLAPSE: Mild Posterior vaginal wall laxity present with cough prolapse test in hooklying   TODAY'S TREATMENT:                                                                                                                              DATE:   03/29/24: Toileting mechanics for optimal bowel emptying  Blow as you go technique  Potty dance  Deep diaphragmatic breathing  Sit to stand + adductor ball squeeze + diaphragmatic breathing 2x10  Seated hip abduction (GTB) + diaphragmatic breathing 2x10   04/11/24: Bridge + pelvic floor contraction + diaphragmatic breathing 2x10  Bridge + adductor ball squeeze + diaphragmatic breathing 2x10 Sit to stand + hip abduction (GTB) + diaphragmatic breathing 2x10  Seated clamshell (GTB) + diaphragmatic breathing 2x10  Seated march (GTB) + diaphragmatic breathing 2x10    04/25/24: Review of urge drill for managing urge urinary incontinence  Standing hip extension + RTB + diaphragmatic breathing 2x10  Sit to stand with resistance around knees (GTB) + kegel while exhaling + diaphragmatic breathing 2x10  Seated pelvic floor elevators with lengthening or bulging added to the end of the complex to promote bowel emptying  Internal exam of rectal canal: Patient confirms identification and approves physical therapist to perform internal soft tissue work   Pt found to have a weak pelvic floor contraction rectally with no major dyssynergia present   PATIENT EDUCATION:  Education details: relative anatomy and the connection between the diaphragm and pelvic floor, how water  intake and bladder irritants affect the pelvis,  Person educated: Patient Education method: Explanation,  Demonstration, Tactile cues, Verbal cues, and Handouts Education  comprehension: verbalized understanding, returned demonstration, verbal cues required, tactile cues required, and needs further education  HOME EXERCISE PROGRAM: Access Code: [email or web address] URL: [email or web address] Date: 04/25/2024 Prepared by: Celena Domino  Exercises - Seated Pelvic Floor Contraction  - 1 x daily - 7 x weekly - 2 sets - 10 reps - Seated Quick Flick Pelvic Floor Contractions  - 1 x daily - 7 x weekly - 2 sets - 15 reps - Supine Bridge with Mini Swiss Ball Between Knees  - 1 x daily - 7 x weekly - 2 sets - 10 reps - Sit to Stand with Resistance Around Legs  - 1 x daily - 7 x weekly - 2 sets - 10 reps - Seated Hip Abduction with Resistance  - 1 x daily - 7 x weekly - 2 sets - 15 reps - Seated March with Resistance  - 1 x daily - 7 x weekly - 2 sets - 10 reps - Hip Extension with Resistance Loop  - 1 x daily - 7 x weekly - 2 sets - 10 reps - Seated Pelvic Floor Elevators With Lengthening  - 1 x daily - 7 x weekly - 2 sets - 5 reps  ASSESSMENT:  CLINICAL IMPRESSION: Patient is a 83 y.o. female  who was seen today for physical therapy treatment for rectocele treatment. Pt has been noticing improvements in bowel movements and is experiencing less rectal pressure overall, along with improved urinary control. Bridge exercise was replaced with standing hip extension due to pt's neck pain. Rectal examination performed and Pt found to have a weak pelvic floor contraction rectally with no major dyssynergia present. Pt educated on pelvic floor elevators with bulging to promote emptying of bowels and full rectal pelvic floor range of motion. Overall, patient tolerated evaluation well and Pt would benefit from additional PT to further address deficits.    OBJECTIVE IMPAIRMENTS: decreased coordination, decreased endurance, decreased mobility, decreased ROM, decreased strength, and pain.   ACTIVITY LIMITATIONS: continence and toileting  PARTICIPATION LIMITATIONS: complete bowel  movements   PERSONAL FACTORS: Age, Past/current experiences, and Time since onset of injury/illness/exacerbation are also affecting patient's functional outcome.   REHAB POTENTIAL: Good  CLINICAL DECISION MAKING: Stable/uncomplicated  EVALUATION COMPLEXITY: Low   GOALS: Goals reviewed with patient? Yes  SHORT TERM GOALS: Target date: 03/14/2024  Pt will be independent with HEP.  Baseline: Goal status: INITIAL  2.  Pt will be able to correctly perform diaphragmatic breathing and appropriate pressure management in order to prevent worsening vaginal wall laxity and improve pelvic floor A/ROM.  Baseline:  Goal status: INITIAL  3.  Pt will be independent with use of squatty potty, relaxed toileting mechanics, and improved bowel movement techniques in order to increase ease of bowel movements and complete evacuation.  Baseline:  Goal status: INITIAL  LONG TERM GOALS: Target date: 08/14/2024  Pt will be independent with advanced HEP.  Baseline:  Goal status: INITIAL  2.  Pt to demonstrate improved coordination of pelvic floor and breathing mechanics with 10# squat with appropriate synergistic patterns to decrease rectal pressure at least 75% of the time for improved ability to complete a 30 minute workout with strain at pelvic floor and symptoms.   Baseline:  Goal status: INITIAL  3.  Pt will report her BMs are complete due to improved bowel habits and evacuation techniques.  Baseline:  Goal status: INITIAL  4.  Pt will demonstrate 3+/5 pelvic floor muscle strength with  appropriate coordination in order to decrease urinary incontinence, urgency and frequency.   Baseline: 3 with improper coordination  Goal status: INITIAL  PLAN:  PT FREQUENCY: 1-2x/week  PT DURATION: other: 3 months   PLANNED INTERVENTIONS: 97110-Therapeutic exercises, 97530- Therapeutic activity, 97112- Neuromuscular re-education, 97535- Self Care, [id number]- Manual therapy, Patient/Family education, Taping,  Joint mobilization, Spinal mobilization, Scar mobilization, Cryotherapy, and Moist heat  PLAN FOR NEXT SESSION: continued pelvic floor AROM training in seated and varying positions, introduce hip strengthening and core strengthening, gluteal strengthening, toileting mechanics for optimal emptying, re-coordination of toileting mechanics with defecation   Celena JAYSON Domino, PT 04/25/2024, 11:49 AM [facility] [address], [address] [place], [place] [address] Phone # [phone] Fax [phone]

## 2024-04-25 NOTE — Patient Instructions (Signed)
  Urge Drill  When you feel an urge to go, follow these steps to regain control: Stop what you are doing and be still Take one deep breath, directing your air into your abdomen Think an affirming thought, such as "I've got this." Do 5 quick flicks of your pelvic floor Walk with control to the bathroom to void, or delay voiding

## 2024-05-03 ENCOUNTER — Ambulatory Visit: Admitting: Physical Therapy

## 2024-05-03 DIAGNOSIS — R279 Unspecified lack of coordination: Secondary | ICD-10-CM

## 2024-05-03 DIAGNOSIS — R293 Abnormal posture: Secondary | ICD-10-CM

## 2024-05-03 DIAGNOSIS — M6281 Muscle weakness (generalized): Secondary | ICD-10-CM

## 2024-05-03 NOTE — Therapy (Signed)
 OUTPATIENT PHYSICAL THERAPY FEMALE PELVIC TREATMENT/RECERT   Patient Name: [name] MRN: [id number] DOB:[birth year], 83 y.o., female Today's Date: 05/03/2024  END OF SESSION:  PT End of Session - 05/03/24 1141     Visit Number 6    Number of Visits 10    Date for Recertification  04/25/24    Authorization Type [employer]    Authorization Time Period Cohere Approved 10 vls 02/15/24-05/16/24-auth#[id number]    Authorization - Visit Number 6    Authorization - Number of Visits 10    PT Start Time 1100    PT Stop Time 1145    PT Time Calculation (min) 45 min    Activity Tolerance Patient tolerated treatment well    Behavior During Therapy WFL for tasks assessed/performed              Past Medical History:  Diagnosis Date   Adrenal gland dysfunction     dr Mat, [facility] B now resolved   Anemia    iron   Arthritis    psoriatic  in hips oa in hands   Blood transfusion without reported diagnosis 1978   Cataract    chronic headaches    histamine migraines, rarely occur   Chronic kidney disease    hx bladder infections yrs ago   Colon polyp yrs ago   Dysrhythmia 1996 to 1999   wore holter monitor result of accidental fall, occasional irregular pulse   Family history of adverse reaction to anesthesia    father had aspiration after anesthesia one time   Gastric polyps    GERD (gastroesophageal reflux disease)    History of migraine headaches    Hyperlipidemia    Hypothyroidism    Neuromuscular disease (HCC)    fibromyalgia   Pneumonia 2005aspiration pneumonia   stopped when got pneumonia shot, also 2015 regular pneumonia   PONV (postoperative nausea and vomiting)    patient wishes to have no anesthetics that could affect memory due to past memory issues, no cause found   Psoriatic arthritis (HCC)    PVC (premature ventricular contraction)    Past Surgical History:  Procedure Laterality Date   ABDOMINAL HYSTERECTOMY     left ovary left and tube  left   APPENDECTOMY     with hysterectomy   CARDIAC CATHETERIZATION     CATARACT EXTRACTION, BILATERAL     CHOLECYSTECTOMY     COLONOSCOPY     FINGER SURGERY     middle finger right hand   INCONTINENCE SURGERY     bladder sling   RECTOCELE REPAIR     RHINOPLASTY     SHOULDER ARTHROSCOPY WITH ROTATOR CUFF REPAIR AND SUBACROMIAL DECOMPRESSION Right 06/17/2015   Procedure: SHOULDER DIAGNOSTIC OPERATIVE ARTHROSCOPY WITH MINI-OPEN ROTATOR CUFF REPAIR AND SUBACROMIAL DECOMPRESSION;  Surgeon: Glendia Cordella Hutchinson, MD;  Location: [facility];  Service: Orthopedics;  Laterality: Right;   TONSILLECTOMY  age 49   adenoids also   TOTAL HIP ARTHROPLASTY Right 07/06/2017   Procedure: RIGHT TOTAL HIP ARTHROPLASTY ANTERIOR APPROACH;  Surgeon: Melodi Lerner, MD;  Location: [facility];  Service: Orthopedics;  Laterality: Right;   Patient Active Problem List   Diagnosis Date Noted   Bilateral lower extremity edema 12/21/2023   Essential hypertension 05/03/2023   Dehydration 05/03/2023   Elevated coronary artery calcium  score 12/24/2022   Burning sensation 05/12/2022   Routine general medical examination at a health care facility 03/19/2022   PVC's (premature ventricular contractions) 08/04/2021   Hypothyroidism 03/05/2021   Migraine 03/05/2021  Hyperlipidemia 10/16/2020   Femoral nerve lesion, right 02/16/2018   OA (osteoarthritis) of hip 07/06/2017   Cervical disc disorder with radiculopathy of cervical region 03/16/2016   Contracture of joint of right shoulder region 03/16/2016   DDD (degenerative disc disease), cervical 12/01/2015   MCI (mild cognitive impairment) 12/01/2015   Psoriatic arthritis (HCC) 11/08/2013   Adrenal gland dysfunction     PCP: Claudene Pellet, MD  REFERRING PROVIDER: Mat Browning, MD  REFERRING DIAG:  N81.6 (ICD-10-CM) - Rectocele   THERAPY DIAG:  Unspecified lack of coordination  Muscle weakness (generalized)  Abnormal posture  Rationale for Evaluation and  Treatment: Rehabilitation  ONSET DATE: 2024  SUBJECTIVE:                                                                                                                                                                                           SUBJECTIVE STATEMENT: Patient reports that she is doing well today. She wishes to review HEP as some of them have been confusing to her. Neck pain has been better - she has her MRI tomorrow. She had a great bowel movement yesterday and she is very pleased with this - she felt like the bowel movement was complete. Rectal pressure occurs but it is not constant or bad - most of the time she does not experience the rectal pressure. Urinary leakage has been a little worse because she has not been diligent with her exercises. She is not leaking during the day at all, only in the mornings with a full bladder.   Eval: Patient reports to PFPT with a rectocele that she has had before in 2005. She's had bladder sling repair and rectocele repair surgery in the past for this reason. Now, starting about a year ago, she began noticing her stool was very thin and she was having difficulty passing complete bowel movements/feeling empty. She has repetitive bowel movements throughout the day. She feels like her stool is getting caught in the rectum. Patient states that the pressure from incomplete bowel movements is rated as 5/10 or under on the discomfort scale when this happens. Fluid intake: water , milk with coffee (2 cups daily)  FUNCTIONAL LIMITATIONS: toileting and evacuation of stool   PERTINENT HISTORY:  Medications for current condition: estradiol  patch that she changes weekly, synthroid  in the mornings, Colace as needed  Surgeries: hysterectomy at age 42 Sexual abuse: No  PAIN:  Are you having pain? No NPRS scale: 0/10  PRECAUTIONS: None  RED FLAGS: None   WEIGHT BEARING RESTRICTIONS: No  FALLS:  Has patient fallen in last 6 months? No  OCCUPATION:  retired  ACTIVITY LEVEL : usually on her feet throughout the day, walks 2 miles 3 days a week, trying to increase this   PLOF: Independent  PATIENT GOALS: to have more complete bowel movements and have less discomfort   BOWEL MOVEMENT: Pain with bowel movement: No Type of bowel movement:Type (Bristol Stool Scale) 4-5, Frequency 1-5x/day, most days it is 3-5x/day, Strain no, and Splinting PRN Fully empty rectum: No Leakage: No                                                     Caused by:  Pads: No Fiber supplement/laxative Yes prunes and colace   URINATION: Pain with urination: No Fully empty bladder: Yes:                                  Post-void dribble: No Stream: Strong Urgency: Yes  Frequency:during the day within normal limits                                                          Nocturia: No   Leakage: none Pads/briefs: No  PROLAPSE: Pressure in the posterior aspect of vaginal canal   OBJECTIVE:  Note: Objective measures were completed at Evaluation unless otherwise noted.  PATIENT SURVEYS:  PFIQ-7: 60  COGNITION: Overall cognitive status: Within functional limits for tasks assessed     SENSATION: Light touch: Appears intact  LUMBAR SPECIAL TESTS:  Single leg stance test: Positive  FUNCTIONAL TESTS:  Single leg stance:  Rt: +  Lt: + Sit-up test: 1/3 Squat: general lumbopelvic stiffness present with  Bed mobility: within normal limits   GAIT: Assistive device utilized: None Comments: mild trendelenburg gait pattern with ambulation   POSTURE: rounded shoulders, forward head, and flexed trunk   LUMBARAROM/PROM: within normal limits for all motions tested bilaterally with no pain   LOWER EXTREMITY ROM: within normal limits for all motions tested bilaterally with no pain   LOWER EXTREMITY MMT: 4/5 bilateral knees and hips grossly   PALPATION:  General: no tenderness to hip flexor palpation or adductor palpation   Pelvic Alignment: within normal  limits   Abdominal: abdominal bracing at rest   Diastasis: No Distortion: No  Breathing: upper chest breathing and abdominal bracing at rest with decreased lower rib excursion with inhalation  Scar tissue: Yes: surgical                 External Perineal Exam: dryness present with decreased tissue bulk in superficial pelvic floor musculature                              Internal Pelvic Floor: Patient fully consents to today's internal examination. She demonstrates low tone and no palpable trigger points throughout superficial and deep pelvic floor musculature. She has a weak pelvic floor contraction in supine that improves when paired with an exhale to achieve more normal range of motion. She demonstrates posterior vaginal wall laxity with cough test in supine. No pain during or following today's exam.  Patient confirms identification and  approves PT to assess internal pelvic floor and treatment Yes No emotional/communication barriers or cognitive limitation. Patient is motivated to learn. Patient understands and agrees with treatment goals and plan. PT explains patient will be examined in standing, sitting, and lying down to see how their muscles and joints work. When they are ready, they will be asked to remove their underwear so PT can examine their perineum. The patient is also given the option of providing their own chaperone as one is not provided in our facility. The patient also has the right and is explained the right to defer or refuse any part of the evaluation or treatment including the internal exam. With the patient's consent, PT will use one gloved finger to gently assess the muscles of the pelvic floor, seeing how well it contracts and relaxes and if there is muscle symmetry. After, the patient will get dressed and PT and patient will discuss exam findings and plan of care. PT and patient discuss plan of care, schedule, attendance policy and HEP activities.  PELVIC MMT:   MMT eval   Vaginal 3/5, 6 quick flicks, 2 second hold   Internal Anal Sphincter   External Anal Sphincter   Puborectalis   Diastasis Recti   (Blank rows = not tested)       TONE: Low in bilateral aspects of superficial and deep pelvic floor musculature   PROLAPSE: Mild Posterior vaginal wall laxity present with cough prolapse test in hooklying   TODAY'S TREATMENT:                                                                                                                              DATE:   04/11/24: Bridge + pelvic floor contraction + diaphragmatic breathing 2x10  Bridge + adductor ball squeeze + diaphragmatic breathing 2x10 Sit to stand + hip abduction (GTB) + diaphragmatic breathing 2x10  Seated clamshell (GTB) + diaphragmatic breathing 2x10  Seated march (GTB) + diaphragmatic breathing 2x10    04/25/24: Review of urge drill for managing urge urinary incontinence  Standing hip extension + RTB + diaphragmatic breathing 2x10  Sit to stand with resistance around knees (GTB) + kegel while exhaling + diaphragmatic Seated pelvic floor elevators with lengthening or bulging added to the end of the complex to promote bowel emptying breathing 2x10  Seated pelvic floor elevators with lengthening or bulging added to the end of the complex to promote bowel emptying  Internal exam of rectal canal: Patient confirms identification and approves physical therapist to perform internal soft tissue work   Pt found to have a weak pelvic floor contraction rectally with no major dyssynergia present   05/03/24: Seated pelvic floor elevators with lengthening or bulging added to the end of the complex to promote bowel emptying x90 sec Review of seated pelvic floor training exercises and discussion of practicing these in standing for additional gravitational load to pelvic floor  Standing hip extension with YTB around ankles + diaphragmatic  breathing 2x10  Standing hip abduction with YTB around ankles +  diaphragmatic breathing 2x10  Review of urge drill for managing urge urinary incontinence   PATIENT EDUCATION:  Education details: relative anatomy and the connection between the diaphragm and pelvic floor, how water  intake and bladder irritants affect the pelvis,  Person educated: Patient Education method: Explanation, Demonstration, Tactile cues, Verbal cues, and Handouts Education comprehension: verbalized understanding, returned demonstration, verbal cues required, tactile cues required, and needs further education  HOME EXERCISE PROGRAM: Access Code: [email or web address] URL: [email or web address] Date: 05/03/2024 Prepared by: Celena Domino  Exercises - Seated Pelvic Floor Contraction  - 1 x daily - 7 x weekly - 2 sets - 10 reps - Seated Quick Flick Pelvic Floor Contractions  - 1 x daily - 7 x weekly - 2 sets - 15 reps - Seated Pelvic Floor Elevators With Lengthening  - 1 x daily - 7 x weekly - 2 sets - 5 reps - Sit to Stand with Resistance Around Legs  - 1 x daily - 7 x weekly - 2 sets - 10 reps - Seated Hip Abduction with Resistance  - 1 x daily - 7 x weekly - 2 sets - 15 reps - Seated March with Resistance  - 1 x daily - 7 x weekly - 2 sets - 10 reps - Hip Extension with Resistance Loop  - 1 x daily - 7 x weekly - 2 sets - 10 reps - Hip Abduction with Resistance Loop  - 1 x daily - 7 x weekly - 2 sets - 10 reps  ASSESSMENT:  CLINICAL IMPRESSION: Patient is a 84 y.o. female  who was seen today for physical therapy treatment for rectocele treatment. Pt has been noticing improvements in bowel movements and is experiencing less rectal pressure overall, along with improved urinary control. Pt wished to review HEP today to ensure she is performing correctly at home. Pt required minimal cueing from PT and is demonstrating slow improvement in coordination in pelvic floor. She reports feeling more controlled when performing pelvic floor elevators in seated today. Overall, patient tolerated  evaluation well and Pt would benefit from additional PT to further address deficits.    OBJECTIVE IMPAIRMENTS: decreased coordination, decreased endurance, decreased mobility, decreased ROM, decreased strength, and pain.   ACTIVITY LIMITATIONS: continence and toileting  PARTICIPATION LIMITATIONS: complete bowel movements   PERSONAL FACTORS: Age, Past/current experiences, and Time since onset of injury/illness/exacerbation are also affecting patient's functional outcome.   REHAB POTENTIAL: Good  CLINICAL DECISION MAKING: Stable/uncomplicated  EVALUATION COMPLEXITY: Low   GOALS: Goals reviewed with patient? Yes  SHORT TERM GOALS: Target date: 03/14/2024  Pt will be independent with HEP.  Baseline: Goal status: GOAL MET 05/03/24  2.  Pt will be able to correctly perform diaphragmatic breathing and appropriate pressure management in order to prevent worsening vaginal wall laxity and improve pelvic floor A/ROM.  Baseline:  Goal status: GOAL MET 05/03/24  3.  Pt will be independent with use of squatty potty, relaxed toileting mechanics, and improved bowel movement techniques in order to increase ease of bowel movements and complete evacuation.  Baseline:  Goal status: GOAL MET 05/03/24  LONG TERM GOALS: Target date: 08/14/2024  Pt will be independent with advanced HEP.  Baseline:  Goal status: ONGOING 05/03/24  2.  Pt to demonstrate improved coordination of pelvic floor and breathing mechanics with 10# squat with appropriate synergistic patterns to decrease rectal pressure at least 75% of the time for improved ability to complete a 30  minute workout with strain at pelvic floor and symptoms.   Baseline:  Goal status: ONGOING 05/03/24  3.  Pt will report her BMs are complete due to improved bowel habits and evacuation techniques.  Baseline:  Goal status: ONGOING 05/03/24  4.  Pt will demonstrate 3+/5 pelvic floor muscle strength with appropriate coordination in order to  decrease urinary incontinence, urgency and frequency.   Baseline: 3 with improper coordination  Goal status: ONGOING 05/03/24  PLAN:  PT FREQUENCY: 1-2x/week  PT DURATION: other: 3 months   PLANNED INTERVENTIONS: 97110-Therapeutic exercises, 97530- Therapeutic activity, 97112- Neuromuscular re-education, 97535- Self Care, [id number]- Manual therapy, Patient/Family education, Taping, Joint mobilization, Spinal mobilization, Scar mobilization, Cryotherapy, and Moist heat  PLAN FOR NEXT SESSION: continued pelvic floor AROM training in seated and varying positions, introduce hip strengthening and core strengthening, gluteal strengthening, toileting mechanics for optimal emptying, re-coordination of toileting mechanics with defecation   Celena JAYSON Domino, PT 05/03/2024, 11:41 AM [facility] [address], [address] [place], [place] [address] Phone # [phone] Fax [phone]

## 2024-05-10 ENCOUNTER — Encounter: Admitting: Physical Therapy

## 2024-05-10 ENCOUNTER — Other Ambulatory Visit: Payer: Self-pay | Admitting: Cardiovascular Disease

## 2024-05-15 ENCOUNTER — Encounter: Admitting: Physical Therapy

## 2024-05-29 ENCOUNTER — Ambulatory Visit: Admitting: Physical Therapy

## 2024-06-21 ENCOUNTER — Ambulatory Visit: Attending: Obstetrics and Gynecology | Admitting: Physical Therapy

## 2024-06-21 ENCOUNTER — Ambulatory Visit: Attending: Physician Assistant | Admitting: Physician Assistant

## 2024-06-21 ENCOUNTER — Encounter: Payer: Self-pay | Admitting: Physician Assistant

## 2024-06-21 VITALS — BP 130/76 | HR 74 | Ht 65.5 in | Wt 137.8 lb

## 2024-06-21 DIAGNOSIS — R279 Unspecified lack of coordination: Secondary | ICD-10-CM | POA: Insufficient documentation

## 2024-06-21 DIAGNOSIS — R293 Abnormal posture: Secondary | ICD-10-CM | POA: Insufficient documentation

## 2024-06-21 DIAGNOSIS — M6281 Muscle weakness (generalized): Secondary | ICD-10-CM | POA: Insufficient documentation

## 2024-06-21 DIAGNOSIS — R0602 Shortness of breath: Secondary | ICD-10-CM

## 2024-06-21 MED ORDER — METOPROLOL TARTRATE 50 MG PO TABS: 50.0000 mg | ORAL_TABLET | Freq: Once | ORAL | 0 refills | Status: AC

## 2024-06-21 NOTE — Progress Notes (Signed)
 " Cardiology Office Note   Date:  06/21/2024  ID:  [name], DOB [birth year], MRN [id number] PCP: Claudene Pellet, MD   HeartCare Providers Cardiologist:  Dorn Lesches, MD Electrophysiologist:  OLE ONEIDA HOLTS, MD (Inactive)     History of Present Illness [name] is a 84 y.o. female with past medical history of PVCs and NSVT seen on heart monitor in September 2022 on low-dose metoprolol . Echocardiogram in June 2022 was normal with EF 55 to 60%, no regional wall motion abnormality, normal RV, trivial MR.  Nuclear stress test obtained on 10/29/2020 showed EF 44%, normal perfusion without ischemia infarction.  Patient had sustained VT during recovery.  Coronary calcium  scoring test obtained on 02/11/2021 showed coronary calcium  score of 13.9 which placed the patient at 27th percentile for age and sex matched control.  She was referred to Dr. Holts who started her on low-dose amiodarone  but she did not tolerated.  She was able to tolerate a low-dose beta-blocker.  Repeat heart monitor in January 2023 showed no significant SVT.  She does have transient SVT.  Most recently, patient was seen for leg edema.  Repeat echocardiogram obtained on 01/16/2024 showed EF 55 to 60%, no regional wall motion abnormality, normal RV, mild MR.  During the recent office visit, she was switched from metoprolol  to nadolol  for better PVC control and blood pressure control.  Unfortunately she could not tolerate nadolol  either.  By the time she was followed up, lower extremity edema has spontaneously resolved.   I last saw the patient in October 2025 at which time she continued to have dyspnea with more strenuous activity but not with everyday activity.  She also complains of intermittent dizziness.  The dizziness does not occur immediately after body positions however does occur a few seconds after changing the body position.  She has no problem bending over and pick up something off the ground.  She  first noted her dyspnea on exertion during her trip accompanying grandkids to [facility].  After she returned to [place] , she said her symptoms improved however she has not exerted herself as much as she did while visiting [facility]  I obtained preliminary blood work including CBC, CMP, BMP, TSH and a free T4, all which came back normal.  Given lack of chest discomfort, I recommended exercise training first to build up tolerance.  Patient presents today for follow-up.  She denies any chest pain but continues to have dyspnea on exertion.  The dyspnea on exertion is noticeably worsen compared to last year.  She has no significant lower extremity edema today, however mentioned last week, she has some leg edema.  I recommended proceed with coronary CTA to rule out underlying coronary artery disease given her dyspnea on exertion which could be anginal equivalent.  In the morning of coronary CTA, she will need 50 mg metoprolol  tartrate 2 hours prior to the CT image.  ROS:   Patient continued to have dyspnea on exertion but no chest pain.  She has no significant lower extremity edema today however has been having leg edema for the past week.  She denies any orthopnea or PND.  Studies Reviewed      Cardiac Studies & Procedures   ______________________________________________________________________________________________   STRESS TESTS  MYOCARDIAL PERFUSION IMAGING 10/29/2020  Interpretation Summary  The left ventricular ejection fraction is moderately decreased (30-44%).  Nuclear stress EF: 44%.  There were high risk electrocardiographic findings with sustained ventricular tachycardia occurring during recovery.  Myocardial  perfusion appeared normal with no evidence of ischemia or infarction.  Based on ECG findings with sustained VT during recovery and moderately reduced LVEF, this is a high risk study.  Powell Sorrow, MD   ECHOCARDIOGRAM  ECHOCARDIOGRAM COMPLETE  01/16/2024  Narrative ECHOCARDIOGRAM REPORT    Patient Name:   [name] [facility] Date of Exam: 01/16/2024 Medical Rec #:  [id number]           Height:       65.5 in Accession #:    [id number]          Weight:       132.0 lb Date of Birth:  29-Jan-1941            BSA:          1.667 m Patient Age:    83 years            BP:           124/66 mmHg Patient Gender: F                   HR:           61 bpm. Exam Location:  [address]  Procedure: 2D Echo (Both Spectral and Color Flow Doppler were utilized during procedure).  Indications:    R06.00 Dyspnea  History:        Patient has prior history of Echocardiogram examinations, most recent 11/13/2020. Arrythmias:PVC, Signs/Symptoms:Shortness of Breath and Fatigue; Risk Factors:Hypertension and Dyslipidemia. NSVT. Bilateral leg edema. Elevated coronary artery calcium  score.  Sonographer:    Jon Hacker RCS Referring Phys: [id number] GORDY BERGAMO  IMPRESSIONS   1. Left ventricular ejection fraction, by estimation, is 55 to 60%. The left ventricle has normal function. The left ventricle has no regional wall motion abnormalities. Left ventricular diastolic parameters were normal. 2. Right ventricular systolic function is normal. The right ventricular size is normal. 3. The mitral valve is abnormal. Mild mitral valve regurgitation. No evidence of mitral stenosis. 4. The aortic valve is tricuspid. There is mild calcification of the aortic valve. There is mild thickening of the aortic valve. Aortic valve regurgitation is not visualized. Aortic valve sclerosis is present, with no evidence of aortic valve stenosis. 5. The inferior vena cava is normal in size with greater than 50% respiratory variability, suggesting right atrial pressure of 3 mmHg.  FINDINGS Left Ventricle: Left ventricular ejection fraction, by estimation, is 55 to 60%. The left ventricle has normal function. The left ventricle has no regional wall motion abnormalities. Strain was  performed and the global longitudinal strain is indeterminate. The left ventricular internal cavity size was normal in size. There is no left ventricular hypertrophy. Left ventricular diastolic parameters were normal.  Right Ventricle: The right ventricular size is normal. No increase in right ventricular wall thickness. Right ventricular systolic function is normal.  Left Atrium: Left atrial size was normal in size.  Right Atrium: Right atrial size was normal in size.  Pericardium: There is no evidence of pericardial effusion.  Mitral Valve: The mitral valve is abnormal. There is mild thickening of the mitral valve leaflet(s). There is mild calcification of the mitral valve leaflet(s). Mild mitral valve regurgitation. No evidence of mitral valve stenosis.  Tricuspid Valve: The tricuspid valve is normal in structure. Tricuspid valve regurgitation is mild . No evidence of tricuspid stenosis.  Aortic Valve: The aortic valve is tricuspid. There is mild calcification of the aortic valve. There is mild thickening of the aortic valve. Aortic valve  regurgitation is not visualized. Aortic valve sclerosis is present, with no evidence of aortic valve stenosis.  Pulmonic Valve: The pulmonic valve was normal in structure. Pulmonic valve regurgitation is trivial. No evidence of pulmonic stenosis.  Aorta: The aortic root is normal in size and structure.  Venous: The inferior vena cava is normal in size with greater than 50% respiratory variability, suggesting right atrial pressure of 3 mmHg.  IAS/Shunts: No atrial level shunt detected by color flow Doppler.  Additional Comments: 3D was performed not requiring image post processing on an independent workstation and was indeterminate.   LEFT VENTRICLE PLAX 2D LVIDd:         4.48 cm   Diastology LVIDs:         3.31 cm   LV e' medial:    8.81 cm/s LV PW:         0.86 cm   LV E/e' medial:  9.0 LV IVS:        0.76 cm   LV e' lateral:   9.68 cm/s LVOT  diam:     2.00 cm   LV E/e' lateral: 8.2 LV SV:         57 LV SV Index:   34 LVOT Area:     3.14 cm   RIGHT VENTRICLE RV Basal diam:  2.52 cm RV S prime:     9.79 cm/s TAPSE (M-mode): 2.0 cm  LEFT ATRIUM             Index        RIGHT ATRIUM           Index LA diam:        3.60 cm 2.16 cm/m   RA Area:     13.30 cm LA Vol (A2C):   32.3 ml 19.37 ml/m  RA Volume:   29.40 ml  17.63 ml/m LA Vol (A4C):   45.4 ml 27.23 ml/m LA Biplane Vol: 38.1 ml 22.85 ml/m AORTIC VALVE LVOT Vmax:   87.00 cm/s LVOT Vmean:  52.200 cm/s LVOT VTI:    0.183 m  AORTA Ao Root diam: 2.90 cm Ao Asc diam:  3.50 cm  MITRAL VALVE MV Area (PHT): 4.41 cm    SHUNTS MV Decel Time: 172 msec    Systemic VTI:  0.18 m MV E velocity: 79.10 cm/s  Systemic Diam: 2.00 cm MV A velocity: 73.90 cm/s MV E/A ratio:  1.07  Maude Emmer MD Electronically signed by Maude Emmer MD Signature Date/Time: 01/16/2024/4:10:46 PM    Final    MONITORS  LONG TERM MONITOR (3-14 DAYS) 06/17/2021  Narrative HR 50 - 176, average 69 bpm. 2 NSVT, longest lasting 6 beats at a rate of 164 bpm. 52 SVT, longest lasting 13 beats Occasional supraventricular ectopy, 2.4% Rare ventricular ectopy No sustained arrhythmias.  Ole T. Cindie, MD, [facility], [facility] Cardiac Electrophysiology   CT SCANS  CT CARDIAC SCORING (SELF PAY ONLY) 02/11/2021  Addendum 02/11/2021  1:38 PM ADDENDUM REPORT: 02/11/2021 13:36  CLINICAL DATA:  Risk stratification  EXAM: Coronary Calcium  Score  TECHNIQUE: The patient was scanned on a Siemens Somatom 64 slice scanner. Axial non-contrast 3 mm slices were carried out through the heart. The data set was analyzed on a dedicated work station and scored using the Agatson method.  FINDINGS: Non-cardiac: See separate report from [facility].  Ascending aorta: Normal diameter 3.3 cm  Pericardium: Normal  Coronary arteries: Mild calcium  noted in proximal LAD  IMPRESSION: Coronary  calcium  score of 13.9. This was  27 th percentile for age and sex matched control.  Maude Emmer   Electronically Signed By: Maude Emmer M.D. On: 02/11/2021 13:36  Narrative EXAM: OVER-READ INTERPRETATION  CT CHEST  The following report is an over-read performed by radiologist Dr. Toribio Aye of [facility] on 02/11/2021. This over-read does not include interpretation of cardiac or coronary anatomy or pathology. The coronary calcium  score/coronary CTA interpretation by the cardiologist is attached.  COMPARISON:  None.  FINDINGS: Atherosclerotic calcifications in the thoracic aorta. Within the visualized portions of the thorax there are no suspicious appearing pulmonary nodules or masses, there is no acute consolidative airspace disease, no pleural effusions, no pneumothorax and no lymphadenopathy. Visualized portions of the upper abdomen are unremarkable. There are no aggressive appearing lytic or blastic lesions noted in the visualized portions of the skeleton.  IMPRESSION: 1.  Aortic Atherosclerosis (ICD10-I70.0).  Electronically Signed: By: Toribio Aye M.D. On: 02/11/2021 10:32     ______________________________________________________________________________________________      Risk Assessment/Calculations           Physical Exam VS:  BP 130/76 (BP Location: Right Arm, Patient Position: Sitting, Cuff Size: Normal)   Pulse 74   Ht 5' 5.5 (1.664 m)   Wt 137 lb 12.8 oz (62.5 kg)   SpO2 98%   BMI 22.58 kg/m        Wt Readings from Last 3 Encounters:  06/21/24 137 lb 12.8 oz (62.5 kg)  03/19/24 135 lb 9.6 oz (61.5 kg)  12/21/23 132 lb (59.9 kg)    GEN: Well nourished, well developed in no acute distress NECK: No JVD; No carotid bruits CARDIAC: RRR, no murmurs, rubs, gallops RESPIRATORY:  Clear to auscultation without rales, wheezing or rhonchi  ABDOMEN: Soft, non-tender, non-distended EXTREMITIES:  No edema; No deformity    ASSESSMENT AND PLAN  Dyspnea on exertion: She first noticed the symptoms around July of last year while accompanying family member to the [facility].  Symptoms tend to occur mostly with exertion.  After she returned to [place] , symptom improved to some degree.  Echocardiogram in August 2025 showed normal EF, no significant valve issue.  Given lack of chest discomfort, initially recommended she try to increase activity level, however her dyspnea on exertion has not resolved for the past several months.  Will proceed with coronary CTA to rule out anginal equivalent.  If coronary CTA is negative, she should not have any cardiac limitations to continue to exercise       Dispo: Follow-up with Dr. Court in 6-8 months  Signed, Johncarlo Maalouf, PA  "

## 2024-06-21 NOTE — Patient Instructions (Signed)
 Medication Instructions:  NO CHANGES *If you need a refill on your cardiac medications before your next appointment, please call your pharmacy*  Lab Work: BMET TODAY If you have labs (blood work) drawn today and your tests are completely normal, you will receive your results only by: MyChart Message (if you have MyChart) OR A paper copy in the mail If you have any lab test that is abnormal or we need to change your treatment, we will call you to review the results.  Testing/Procedures:   Your cardiac CT will be scheduled at one of the below locations:   [facility] [address] [place], [place] [address] [phone] (Severe contrast allergies only)  OR   [facility] [address] [place], [place] [address] [phone]  OR   [facility] [address] [place], [place] [address] [phone]  OR   [facility]. [facility] and Vascular Tower [address]  [place], [place] [address]  OR   [facility] [address] [place], [place] [phone]  If scheduled at [facility], please arrive at the [facility] and Children's Entrance (Entrance C2) of [facility] 30 minutes prior to test start time. You can use the FREE valet parking offered at entrance C (encouraged to control the heart rate for the test)  Proceed to the [facility] Radiology Department (first floor) to check-in and test prep.  All radiology patients and guests should use entrance C2 at [facility], accessed from [facility], even though the hospital's physical address listed is [address]  If scheduled at the Heart and Vascular Tower at [address], please enter the parking lot using the [address] entrance and use the FREE valet service at the patient drop-off area. Enter the building and check-in with registration on the main floor.  If scheduled at [facility],  please arrive to the Heart and Vascular Center 15 mins early for check-in and test prep.  There is spacious parking and easy access to the radiology department from the [facility] Heart and Vascular entrance. Please enter here and check-in with the desk attendant.   If scheduled at [facility], please arrive 30 minutes early for check-in and test prep.  Please follow these instructions carefully (unless otherwise directed):  An IV will be required for this test and Nitroglycerin will be given.  Hold all erectile dysfunction medications at least 3 days (72 hrs) prior to test. (Ie viagra, cialis, sildenafil, tadalafil, etc)   On the Night Before the Test: Be sure to Drink plenty of water . Do not consume any caffeinated/decaffeinated beverages or chocolate 12 hours prior to your test. Do not take any antihistamines 12 hours prior to your test.  On the Day of the Test: Drink plenty of water  until 1 hour prior to the test. Do not eat any food 1 hour prior to test. You may take your regular medications prior to the test.  Take metoprolol  (Lopressor ) 50 MG ONE TIME DOSE TWO HOURS PRIOR TO TEST If you take Furosemide/Hydrochlorothiazide/Spironolactone/Chlorthalidone, please HOLD on the morning of the test. Patients who wear a continuous glucose monitor MUST remove the device prior to scanning. FEMALES- please wear underwire-free bra if available, avoid dresses & tight clothing  After the Test: Drink plenty of water . After receiving IV contrast, you may experience a mild flushed feeling. This is normal. On occasion, you may experience a mild rash up to 24  hours after the test. This is not dangerous. If this occurs, you can take Benadryl  25 mg, Zyrtec, Claritin, or Allegra and increase your fluid intake. (Patients taking Tikosyn should avoid Benadryl , and may take Zyrtec, Claritin, or Allegra) If you experience trouble breathing, this can be serious. If it is severe call 911 IMMEDIATELY. If it  is mild, please call our office.  We will call to schedule your test 2-4 weeks out understanding that some insurance companies will need an authorization prior to the service being performed.   For more information and frequently asked questions, please visit our website : [email or web address]  For non-scheduling related questions, please contact the cardiac imaging nurse navigator should you have any questions/concerns: Cardiac Imaging Nurse Navigators Direct Office Dial: [phone]   For scheduling needs, including cancellations and rescheduling, please call Brittany, [phone].  For billing questions, please call [phone].    Follow-Up: At [facility], you and your health needs are our priority.  As part of our continuing mission to provide you with exceptional heart care, our providers are all part of one team.  This team includes your primary Cardiologist (physician) and Advanced Practice Providers or APPs (Physician Assistants and Nurse Practitioners) who all work together to provide you with the care you need, when you need it.  Your next appointment:   6-8 month(s)  Provider:   Dorn Lesches, MD

## 2024-06-21 NOTE — Therapy (Signed)
 " OUTPATIENT PHYSICAL THERAPY FEMALE PELVIC TREATMENT/RECERT   Patient Name: [name] MRN: [id number] DOB:08-07-1940, 84 y.o., female Today's Date: 06/21/2024  END OF SESSION:  PT End of Session - 06/21/24 1555     Visit Number 7    Number of Visits 10    Date for Recertification  04/25/24    Authorization Type [employer]    Authorization Time Period auth submitted 06/21/24    Authorization - Number of Visits 10    PT Start Time 0255    PT Stop Time 0355    PT Time Calculation (min) 60 min    Activity Tolerance Patient tolerated treatment well    Behavior During Therapy [facility] for tasks assessed/performed               Past Medical History:  Diagnosis Date   Adrenal gland dysfunction     dr Mat, coricare B now resolved   Anemia    iron   Arthritis    psoriatic  in hips oa in hands   Blood transfusion without reported diagnosis 1978   Cataract    chronic headaches    histamine migraines, rarely occur   Chronic kidney disease    hx bladder infections yrs ago   Colon polyp yrs ago   Dysrhythmia 1996 to 1999   wore holter monitor result of accidental fall, occasional irregular pulse   Family history of adverse reaction to anesthesia    father had aspiration after anesthesia one time   Gastric polyps    GERD (gastroesophageal reflux disease)    History of migraine headaches    Hyperlipidemia    Hypothyroidism    Neuromuscular disease (HCC)    fibromyalgia   Pneumonia 2005aspiration pneumonia   stopped when got pneumonia shot, also 2015 regular pneumonia   PONV (postoperative nausea and vomiting)    patient wishes to have no anesthetics that could affect memory due to past memory issues, no cause found   Psoriatic arthritis (HCC)    PVC (premature ventricular contraction)    Past Surgical History:  Procedure Laterality Date   ABDOMINAL HYSTERECTOMY     left ovary left and tube left   APPENDECTOMY     with hysterectomy   CARDIAC  CATHETERIZATION     CATARACT EXTRACTION, BILATERAL     CHOLECYSTECTOMY     COLONOSCOPY     FINGER SURGERY     middle finger right hand   INCONTINENCE SURGERY     bladder sling   RECTOCELE REPAIR     RHINOPLASTY     SHOULDER ARTHROSCOPY WITH ROTATOR CUFF REPAIR AND SUBACROMIAL DECOMPRESSION Right 06/17/2015   Procedure: SHOULDER DIAGNOSTIC OPERATIVE ARTHROSCOPY WITH MINI-OPEN ROTATOR CUFF REPAIR AND SUBACROMIAL DECOMPRESSION;  Surgeon: Glendia Cordella Hutchinson, MD;  Location: [facility] OR;  Service: Orthopedics;  Laterality: Right;   TONSILLECTOMY  age 32   adenoids also   TOTAL HIP ARTHROPLASTY Right 07/06/2017   Procedure: RIGHT TOTAL HIP ARTHROPLASTY ANTERIOR APPROACH;  Surgeon: Melodi Lerner, MD;  Location: [facility];  Service: Orthopedics;  Laterality: Right;   Patient Active Problem List   Diagnosis Date Noted   Bilateral lower extremity edema 12/21/2023   Essential hypertension 05/03/2023   Dehydration 05/03/2023   Elevated coronary artery calcium  score 12/24/2022   Burning sensation 05/12/2022   Routine general medical examination at a health care facility 03/19/2022   PVC's (premature ventricular contractions) 08/04/2021   Hypothyroidism 03/05/2021   Migraine 03/05/2021   Hyperlipidemia 10/16/2020   Femoral nerve  lesion, right 02/16/2018   OA (osteoarthritis) of hip 07/06/2017   Cervical disc disorder with radiculopathy of cervical region 03/16/2016   Contracture of joint of right shoulder region 03/16/2016   DDD (degenerative disc disease), cervical 12/01/2015   MCI (mild cognitive impairment) 12/01/2015   Psoriatic arthritis (HCC) 11/08/2013   Adrenal gland dysfunction     PCP: Claudene Pellet, MD  REFERRING PROVIDER: Mat Browning, MD  REFERRING DIAG:  N81.6 (ICD-10-CM) - Rectocele   THERAPY DIAG:  Abnormal posture  Muscle weakness (generalized)  Unspecified lack of coordination  Rationale for Evaluation and Treatment: Rehabilitation  ONSET DATE: 2024  SUBJECTIVE:                                                                                                                                                                                            SUBJECTIVE STATEMENT: *at 2/5 visit, do a RECERT and get more scheduled visits on the schedule*   She took a break from pelvic PT over the holidays. The pelvic elevators from last session have seemed to help the most. She is noticing that belly breathing on the toilet is helping her pass bowel movements. She has had more incontinence - she had 3 days of leakage 10 days ago.   Patient reports that she is doing well today. She wishes to review HEP as some of them have been confusing to her. Neck pain has been better - she has her MRI tomorrow. She had a great bowel movement yesterday and she is very pleased with this - she felt like the bowel movement was complete. Rectal pressure occurs but it is not constant or bad - most of the time she does not experience the rectal pressure. Urinary leakage has been a little worse because she has not been diligent with her exercises. She is not leaking during the day at all, only in the mornings with a full bladder.   Eval: Patient reports to PFPT with a rectocele that she has had before in 2005. She's had bladder sling repair and rectocele repair surgery in the past for this reason. Now, starting about a year ago, she began noticing her stool was very thin and she was having difficulty passing complete bowel movements/feeling empty. She has repetitive bowel movements throughout the day. She feels like her stool is getting caught in the rectum. Patient states that the pressure from incomplete bowel movements is rated as 5/10 or under on the discomfort scale when this happens. Fluid intake: water , milk with coffee (2 cups daily)  FUNCTIONAL LIMITATIONS: toileting and evacuation of stool   PERTINENT HISTORY:  Medications for current  condition: estradiol  patch that she changes weekly,  synthroid  in the mornings, Colace as needed  Surgeries: hysterectomy at age 43 Sexual abuse: No  PAIN:  Are you having pain? No NPRS scale: 0/10  PRECAUTIONS: None  RED FLAGS: None   WEIGHT BEARING RESTRICTIONS: No  FALLS:  Has patient fallen in last 6 months? No  OCCUPATION: retired   ACTIVITY LEVEL : usually on her feet throughout the day, walks 2 miles 3 days a week, trying to increase this   PLOF: Independent  PATIENT GOALS: to have more complete bowel movements and have less discomfort   BOWEL MOVEMENT: Pain with bowel movement: No Type of bowel movement:Type (Bristol Stool Scale) 4-5, Frequency 1-5x/day, most days it is 3-5x/day, Strain no, and Splinting PRN Fully empty rectum: No Leakage: No                                                     Caused by:  Pads: No Fiber supplement/laxative Yes prunes and colace   URINATION: Pain with urination: No Fully empty bladder: Yes:                                  Post-void dribble: No Stream: Strong Urgency: Yes  Frequency:during the day within normal limits                                                          Nocturia: No   Leakage: none Pads/briefs: No  PROLAPSE: Pressure in the posterior aspect of vaginal canal   OBJECTIVE:  Note: Objective measures were completed at Evaluation unless otherwise noted.  PATIENT SURVEYS:  PFIQ-7: 47  COGNITION: Overall cognitive status: Within functional limits for tasks assessed     SENSATION: Light touch: Appears intact  LUMBAR SPECIAL TESTS:  Single leg stance test: Positive  FUNCTIONAL TESTS:  Single leg stance:  Rt: +  Lt: + Sit-up test: 1/3 Squat: general lumbopelvic stiffness present with  Bed mobility: within normal limits   GAIT: Assistive device utilized: None Comments: mild trendelenburg gait pattern with ambulation   POSTURE: rounded shoulders, forward head, and flexed trunk   LUMBARAROM/PROM: within normal limits for all motions tested  bilaterally with no pain   LOWER EXTREMITY ROM: within normal limits for all motions tested bilaterally with no pain   LOWER EXTREMITY MMT: 4/5 bilateral knees and hips grossly   PALPATION:  General: no tenderness to hip flexor palpation or adductor palpation   Pelvic Alignment: within normal limits   Abdominal: abdominal bracing at rest   Diastasis: No Distortion: No  Breathing: upper chest breathing and abdominal bracing at rest with decreased lower rib excursion with inhalation  Scar tissue: Yes: surgical                 External Perineal Exam: dryness present with decreased tissue bulk in superficial pelvic floor musculature                              Internal Pelvic Floor: Patient fully consents to today's  internal examination. She demonstrates low tone and no palpable trigger points throughout superficial and deep pelvic floor musculature. She has a weak pelvic floor contraction in supine that improves when paired with an exhale to achieve more normal range of motion. She demonstrates posterior vaginal wall laxity with cough test in supine. No pain during or following today's exam.  Patient confirms identification and approves PT to assess internal pelvic floor and treatment Yes No emotional/communication barriers or cognitive limitation. Patient is motivated to learn. Patient understands and agrees with treatment goals and plan. PT explains patient will be examined in standing, sitting, and lying down to see how their muscles and joints work. When they are ready, they will be asked to remove their underwear so PT can examine their perineum. The patient is also given the option of providing their own chaperone as one is not provided in our facility. The patient also has the right and is explained the right to defer or refuse any part of the evaluation or treatment including the internal exam. With the patient's consent, PT will use one gloved finger to gently assess the muscles of the  pelvic floor, seeing how well it contracts and relaxes and if there is muscle symmetry. After, the patient will get dressed and PT and patient will discuss exam findings and plan of care. PT and patient discuss plan of care, schedule, attendance policy and HEP activities.  PELVIC MMT:   MMT eval  Vaginal 3/5, 6 quick flicks, 2 second hold   Internal Anal Sphincter   External Anal Sphincter   Puborectalis   Diastasis Recti   (Blank rows = not tested)       TONE: Low in bilateral aspects of superficial and deep pelvic floor musculature   PROLAPSE: Mild Posterior vaginal wall laxity present with cough prolapse test in hooklying   TODAY'S TREATMENT:                                                                                                                              DATE:   04/11/24: Bridge + pelvic floor contraction + diaphragmatic breathing 2x10  Bridge + adductor ball squeeze + diaphragmatic breathing 2x10 Sit to stand + hip abduction (GTB) + diaphragmatic breathing 2x10  Seated clamshell (GTB) + diaphragmatic breathing 2x10  Seated march (GTB) + diaphragmatic breathing 2x10    04/25/24: Review of urge drill for managing urge urinary incontinence  Standing hip extension + RTB + diaphragmatic breathing 2x10  Sit to stand with resistance around knees (GTB) + kegel while exhaling + diaphragmatic Seated pelvic floor elevators with lengthening or bulging added to the end of the complex to promote bowel emptying breathing 2x10  Seated pelvic floor elevators with lengthening or bulging added to the end of the complex to promote bowel emptying  Internal exam of rectal canal: Patient confirms identification and approves physical therapist to perform internal soft tissue work   Pt found to have a weak  pelvic floor contraction rectally with no major dyssynergia present   05/03/24: Seated pelvic floor elevators with lengthening or bulging added to the end of the complex to promote bowel  emptying x90 sec Review of seated pelvic floor training exercises and discussion of practicing these in standing for additional gravitational load to pelvic floor  Standing hip extension with YTB around ankles + diaphragmatic breathing 2x10  Standing hip abduction with YTB around ankles + diaphragmatic breathing 2x10  Review of urge drill for managing urge urinary incontinence   06/21/24: Seated pelvic floor contraction + diaphragmatic breathing 2x10  20 seated quick flick pelvic floor contraction: 18 second time frame  Seated hip abduction (heavy TB) + diaphragmatic breathing 2x10  Seated marching (heavy TB) + diaphragmatic breathing 2x10  Sit to stand + heavy TB around knees + diaphragmatic breathing 2x10  Seated adductor ball squeeze + diaphragmatic breathing 2x10  PATIENT EDUCATION:  Education details: relative anatomy and the connection between the diaphragm and pelvic floor, how water  intake and bladder irritants affect the pelvis,  Person educated: Patient Education method: Explanation, Demonstration, Tactile cues, Verbal cues, and Handouts Education comprehension: verbalized understanding, returned demonstration, verbal cues required, tactile cues required, and needs further education  HOME EXERCISE PROGRAM: Access Code: [email or web address] URL: [email or web address] Date: 05/03/2024 Prepared by: Celena Domino  Exercises - Seated Pelvic Floor Contraction  - 1 x daily - 7 x weekly - 2 sets - 10 reps - Seated Quick Flick Pelvic Floor Contractions  - 1 x daily - 7 x weekly - 2 sets - 15 reps - Seated Pelvic Floor Elevators With Lengthening  - 1 x daily - 7 x weekly - 2 sets - 5 reps - Sit to Stand with Resistance Around Legs  - 1 x daily - 7 x weekly - 2 sets - 10 reps - Seated Hip Abduction with Resistance  - 1 x daily - 7 x weekly - 2 sets - 15 reps - Seated March with Resistance  - 1 x daily - 7 x weekly - 2 sets - 10 reps - Hip Extension with Resistance Loop  - 1 x daily - 7  x weekly - 2 sets - 10 reps - Hip Abduction with Resistance Loop  - 1 x daily - 7 x weekly - 2 sets - 10 reps  ASSESSMENT:  CLINICAL IMPRESSION: Patient is a 84 y.o. female  who was seen today for physical therapy treatment for rectocele treatment. Since taking a small break from PFPT, [name] has been noticing improvements in her bowel movements since implementing pelvic floor elevator exercise. She is feeling more complete with bowel movements these days. Exercises reviewed and progressed in intensity with addition of heavy resistance band today. Maximum cueing provided from PT. Overall, patient tolerated evaluation well and Pt would benefit from additional PT to further address deficits.    OBJECTIVE IMPAIRMENTS: decreased coordination, decreased endurance, decreased mobility, decreased ROM, decreased strength, and pain.   ACTIVITY LIMITATIONS: continence and toileting  PARTICIPATION LIMITATIONS: complete bowel movements   PERSONAL FACTORS: Age, Past/current experiences, and Time since onset of injury/illness/exacerbation are also affecting patient's functional outcome.   REHAB POTENTIAL: Good  CLINICAL DECISION MAKING: Stable/uncomplicated  EVALUATION COMPLEXITY: Low   GOALS: Goals reviewed with patient? Yes  SHORT TERM GOALS: Target date: 03/14/2024  Pt will be independent with HEP.  Baseline: Goal status: GOAL MET 05/03/24  2.  Pt will be able to correctly perform diaphragmatic breathing and appropriate pressure management in order to prevent worsening  vaginal wall laxity and improve pelvic floor A/ROM.  Baseline:  Goal status: GOAL MET 05/03/24  3.  Pt will be independent with use of squatty potty, relaxed toileting mechanics, and improved bowel movement techniques in order to increase ease of bowel movements and complete evacuation.  Baseline:  Goal status: GOAL MET 05/03/24  LONG TERM GOALS: Target date: 08/14/2024  Pt will be independent with advanced HEP.   Baseline:  Goal status: ONGOING 05/03/24  2.  Pt to demonstrate improved coordination of pelvic floor and breathing mechanics with 10# squat with appropriate synergistic patterns to decrease rectal pressure at least 75% of the time for improved ability to complete a 30 minute workout with strain at pelvic floor and symptoms.   Baseline:  Goal status: ONGOING 05/03/24  3.  Pt will report her BMs are complete due to improved bowel habits and evacuation techniques.  Baseline:  Goal status: ONGOING 05/03/24  4.  Pt will demonstrate 3+/5 pelvic floor muscle strength with appropriate coordination in order to decrease urinary incontinence, urgency and frequency.   Baseline: 3 with improper coordination  Goal status: ONGOING 05/03/24  PLAN:  PT FREQUENCY: 1-2x/week  PT DURATION: other: 3 months   PLANNED INTERVENTIONS: 97110-Therapeutic exercises, 97530- Therapeutic activity, 97112- Neuromuscular re-education, 97535- Self Care, [id number]- Manual therapy, Patient/Family education, Taping, Joint mobilization, Spinal mobilization, Scar mobilization, Cryotherapy, and Moist heat  PLAN FOR NEXT SESSION: continued pelvic floor AROM training in seated and varying positions, introduce hip strengthening and core strengthening, gluteal strengthening, toileting mechanics for optimal emptying, re-coordination of toileting mechanics with defecation   Celena JAYSON Domino, PT 06/21/2024, 3:56 PM [facility] [address] [place], [place] [address] Phone # [phone] Fax [phone]  "

## 2024-06-22 LAB — BASIC METABOLIC PANEL WITH GFR
BUN/Creatinine Ratio: 16 (ref 12–28)
BUN: 12 mg/dL (ref 8–27)
CO2: 26 mmol/L (ref 20–29)
Calcium: 9.3 mg/dL (ref 8.7–10.3)
Chloride: 99 mmol/L (ref 96–106)
Creatinine, Ser: 0.75 mg/dL (ref 0.57–1.00)
Glucose: 92 mg/dL (ref 70–99)
Potassium: 4.4 mmol/L (ref 3.5–5.2)
Sodium: 138 mmol/L (ref 134–144)
eGFR: 80 mL/min/{1.73_m2}

## 2024-06-26 ENCOUNTER — Ambulatory Visit: Payer: Self-pay | Admitting: Physician Assistant

## 2024-06-28 ENCOUNTER — Ambulatory Visit: Attending: Obstetrics and Gynecology | Admitting: Physical Therapy

## 2024-06-28 DIAGNOSIS — R279 Unspecified lack of coordination: Secondary | ICD-10-CM

## 2024-06-28 DIAGNOSIS — R293 Abnormal posture: Secondary | ICD-10-CM

## 2024-06-28 DIAGNOSIS — M6281 Muscle weakness (generalized): Secondary | ICD-10-CM

## 2024-06-28 NOTE — Therapy (Signed)
 " OUTPATIENT PHYSICAL THERAPY FEMALE PELVIC TREATMENT/Progress note (re-auth for insurance)   Patient Name: [name] MRN: [id number] DOB:03-18-1941, 84 y.o., female Today's Date: 06/28/2024  END OF SESSION:  PT End of Session - 06/28/24 1051     Visit Number 8    Number of Visits 10    Date for Recertification  04/25/24    Authorization Type [employer]    Authorization Time Period new auth submitted 06/28/24    Authorization - Visit Number 8    Authorization - Number of Visits 10    PT Start Time 1025    PT Stop Time 1100    PT Time Calculation (min) 35 min    Activity Tolerance Patient tolerated treatment well    Behavior During Therapy WFL for tasks assessed/performed                Past Medical History:  Diagnosis Date   Adrenal gland dysfunction     dr Mat, coricare B now resolved   Anemia    iron   Arthritis    psoriatic  in hips oa in hands   Blood transfusion without reported diagnosis 1978   Cataract    chronic headaches    histamine migraines, rarely occur   Chronic kidney disease    hx bladder infections yrs ago   Colon polyp yrs ago   Dysrhythmia 1996 to 1999   wore holter monitor result of accidental fall, occasional irregular pulse   Family history of adverse reaction to anesthesia    father had aspiration after anesthesia one time   Gastric polyps    GERD (gastroesophageal reflux disease)    History of migraine headaches    Hyperlipidemia    Hypothyroidism    Neuromuscular disease (HCC)    fibromyalgia   Pneumonia 2005aspiration pneumonia   stopped when got pneumonia shot, also 2015 regular pneumonia   PONV (postoperative nausea and vomiting)    patient wishes to have no anesthetics that could affect memory due to past memory issues, no cause found   Psoriatic arthritis (HCC)    PVC (premature ventricular contraction)    Past Surgical History:  Procedure Laterality Date   ABDOMINAL HYSTERECTOMY     left ovary left and  tube left   APPENDECTOMY     with hysterectomy   CARDIAC CATHETERIZATION     CATARACT EXTRACTION, BILATERAL     CHOLECYSTECTOMY     COLONOSCOPY     FINGER SURGERY     middle finger right hand   INCONTINENCE SURGERY     bladder sling   RECTOCELE REPAIR     RHINOPLASTY     SHOULDER ARTHROSCOPY WITH ROTATOR CUFF REPAIR AND SUBACROMIAL DECOMPRESSION Right 06/17/2015   Procedure: SHOULDER DIAGNOSTIC OPERATIVE ARTHROSCOPY WITH MINI-OPEN ROTATOR CUFF REPAIR AND SUBACROMIAL DECOMPRESSION;  Surgeon: Glendia Cordella Hutchinson, MD;  Location: [facility] OR;  Service: Orthopedics;  Laterality: Right;   TONSILLECTOMY  age 65   adenoids also   TOTAL HIP ARTHROPLASTY Right 07/06/2017   Procedure: RIGHT TOTAL HIP ARTHROPLASTY ANTERIOR APPROACH;  Surgeon: Melodi Lerner, MD;  Location: [facility];  Service: Orthopedics;  Laterality: Right;   Patient Active Problem List   Diagnosis Date Noted   Bilateral lower extremity edema 12/21/2023   Essential hypertension 05/03/2023   Dehydration 05/03/2023   Elevated coronary artery calcium  score 12/24/2022   Burning sensation 05/12/2022   Routine general medical examination at a health care facility 03/19/2022   PVC's (premature ventricular contractions) 08/04/2021  Hypothyroidism 03/05/2021   Migraine 03/05/2021   Hyperlipidemia 10/16/2020   Femoral nerve lesion, right 02/16/2018   OA (osteoarthritis) of hip 07/06/2017   Cervical disc disorder with radiculopathy of cervical region 03/16/2016   Contracture of joint of right shoulder region 03/16/2016   DDD (degenerative disc disease), cervical 12/01/2015   MCI (mild cognitive impairment) 12/01/2015   Psoriatic arthritis (HCC) 11/08/2013   Adrenal gland dysfunction     PCP: Claudene Pellet, MD  REFERRING PROVIDER: Mat Browning, MD  REFERRING DIAG:  N81.6 (ICD-10-CM) - Rectocele   THERAPY DIAG:  Abnormal posture  Muscle weakness (generalized)  Unspecified lack of coordination  Rationale for Evaluation and  Treatment: Rehabilitation  ONSET DATE: 2024  SUBJECTIVE:                                                                                                                                                                                           SUBJECTIVE STATEMENT: *at 2/5 visit, do a reauth and get more scheduled visits on the schedule*  No issues at all with the pelvic floor recently. She has had some leakage this morning, a drop sized amount. This happens when she gets a sense of urgency and she has no time to control it. She feels like she not emptying her bladder well. Bowel movements are improving. 0/10 pelvic pain to report - she will occasionally have random bouts of left lower quadrant pain and she thinks this is due to scar tissue.   Eval: Patient reports to PFPT with a rectocele that she has had before in 2005. She's had bladder sling repair and rectocele repair surgery in the past for this reason. Now, starting about a year ago, she began noticing her stool was very thin and she was having difficulty passing complete bowel movements/feeling empty. She has repetitive bowel movements throughout the day. She feels like her stool is getting caught in the rectum. Patient states that the pressure from incomplete bowel movements is rated as 5/10 or under on the discomfort scale when this happens. Fluid intake: water , milk with coffee (2 cups daily)  FUNCTIONAL LIMITATIONS: toileting and evacuation of stool   PERTINENT HISTORY:  Medications for current condition: estradiol  patch that she changes weekly, synthroid  in the mornings, Colace as needed  Surgeries: hysterectomy at age 58 Sexual abuse: No  PAIN: 06/28/24 Are you having pain? No NPRS scale: 0/10  PRECAUTIONS: None  RED FLAGS: None   WEIGHT BEARING RESTRICTIONS: No  FALLS:  Has patient fallen in last 6 months? No  OCCUPATION: retired   ACTIVITY LEVEL : usually on her feet throughout the day, walks 2 miles  3 days a week,  trying to increase this   PLOF: Independent  PATIENT GOALS: to have more complete bowel movements and have less discomfort   06/28/24:   BOWEL MOVEMENT: Pain with bowel movement: No Type of bowel movement:Type (Bristol Stool Scale) 4-5, Frequency 1-5x/day, most days it is 3-5x/day, Strain no, and Splinting PRN Fully empty rectum: No Leakage: No                                                     Caused by:  Pads: No Fiber supplement/laxative Yes prunes and colace   URINATION: Pain with urination: No Fully empty bladder: Yes:                                  Post-void dribble: No Stream: Strong Urgency: Yes  Frequency:during the day within normal limits                                                          Nocturia: No   Leakage: none Pads/briefs: No  PROLAPSE: Pressure in the posterior aspect of vaginal canal   OBJECTIVE:  Note: Objective measures were completed at Evaluation unless otherwise noted.  PATIENT SURVEYS:  PFIQ-7: 86  COGNITION: Overall cognitive status: Within functional limits for tasks assessed     SENSATION: Light touch: Appears intact  LUMBAR SPECIAL TESTS:  Single leg stance test: Positive  FUNCTIONAL TESTS:  Single leg stance:  Rt: +  Lt: + Sit-up test: 1/3 Squat: general lumbopelvic stiffness present with  Bed mobility: within normal limits   GAIT: Assistive device utilized: None Comments: mild trendelenburg gait pattern with ambulation   POSTURE: rounded shoulders, forward head, and flexed trunk   LUMBARAROM/PROM: within normal limits for all motions tested bilaterally with no pain   LOWER EXTREMITY ROM: within normal limits for all motions tested bilaterally with no pain   LOWER EXTREMITY MMT: 4/5 bilateral knees and hips grossly   PALPATION:  General: no tenderness to hip flexor palpation or adductor palpation   Pelvic Alignment: within normal limits   Abdominal: abdominal bracing at rest   Diastasis: No Distortion: No   Breathing: upper chest breathing and abdominal bracing at rest with decreased lower rib excursion with inhalation  Scar tissue: Yes: surgical                 External Perineal Exam: dryness present with decreased tissue bulk in superficial pelvic floor musculature                              Internal Pelvic Floor: Patient fully consents to today's internal examination. She demonstrates low tone and no palpable trigger points throughout superficial and deep pelvic floor musculature. She has a weak pelvic floor contraction in supine that improves when paired with an exhale to achieve more normal range of motion. She demonstrates posterior vaginal wall laxity with cough test in supine. No pain during or following today's exam.  Patient confirms identification and approves PT to assess internal pelvic floor and treatment  Yes No emotional/communication barriers or cognitive limitation. Patient is motivated to learn. Patient understands and agrees with treatment goals and plan. PT explains patient will be examined in standing, sitting, and lying down to see how their muscles and joints work. When they are ready, they will be asked to remove their underwear so PT can examine their perineum. The patient is also given the option of providing their own chaperone as one is not provided in our facility. The patient also has the right and is explained the right to defer or refuse any part of the evaluation or treatment including the internal exam. With the patient's consent, PT will use one gloved finger to gently assess the muscles of the pelvic floor, seeing how well it contracts and relaxes and if there is muscle symmetry. After, the patient will get dressed and PT and patient will discuss exam findings and plan of care. PT and patient discuss plan of care, schedule, attendance policy and HEP activities.  PELVIC MMT:   MMT eval Re-auth day   Vaginal 3/5, 6 quick flicks, 2 second hold  3+/5, 10 quick flicks    Internal Anal Sphincter    External Anal Sphincter    Puborectalis    Diastasis Recti    (Blank rows = not tested)       TONE: Low in bilateral aspects of superficial and deep pelvic floor musculature   PROLAPSE: Mild Posterior vaginal wall laxity present with cough prolapse test in hooklying   TODAY'S TREATMENT:                                                                                                                              DATE:   04/25/24: Review of urge drill for managing urge urinary incontinence  Standing hip extension + RTB + diaphragmatic breathing 2x10  Sit to stand with resistance around knees (GTB) + kegel while exhaling + diaphragmatic Seated pelvic floor elevators with lengthening or bulging added to the end of the complex to promote bowel emptying breathing 2x10  Seated pelvic floor elevators with lengthening or bulging added to the end of the complex to promote bowel emptying  Internal exam of rectal canal: Patient confirms identification and approves physical therapist to perform internal soft tissue work   Pt found to have a weak pelvic floor contraction rectally with no major dyssynergia present   05/03/24: Seated pelvic floor elevators with lengthening or bulging added to the end of the complex to promote bowel emptying x90 sec Review of seated pelvic floor training exercises and discussion of practicing these in standing for additional gravitational load to pelvic floor  Standing hip extension with YTB around ankles + diaphragmatic breathing 2x10  Standing hip abduction with YTB around ankles + diaphragmatic breathing 2x10  Review of urge drill for managing urge urinary incontinence   06/21/24: Seated pelvic floor contraction + diaphragmatic breathing 2x10  20 seated quick flick pelvic floor contraction: 18 second time frame  Seated hip abduction (heavy TB) + diaphragmatic breathing 2x10  Seated marching (heavy TB) + diaphragmatic breathing 2x10  Sit  to stand + heavy TB around knees + diaphragmatic breathing 2x10  Seated adductor ball squeeze + diaphragmatic breathing 2x10  06/28/24: Review of toileting mechanics for optimal emptying of bladder and bowels  Double voiding technique  Passive voiding  Squatting to stretch pelvic floor before urinating  Rocking back and forth Deep diaphragmatic breathing  Progress note: assessment of pelvic floor and review of goals   PATIENT EDUCATION:  Education details: relative anatomy and the connection between the diaphragm and pelvic floor, how water  intake and bladder irritants affect the pelvis,  Person educated: Patient Education method: Explanation, Demonstration, Tactile cues, Verbal cues, and Handouts Education comprehension: verbalized understanding, returned demonstration, verbal cues required, tactile cues required, and needs further education  HOME EXERCISE PROGRAM: Access Code: [email or web address] URL: [email or web address] Date: 05/03/2024 Prepared by: Celena Domino  Exercises - Seated Pelvic Floor Contraction  - 1 x daily - 7 x weekly - 2 sets - 10 reps - Seated Quick Flick Pelvic Floor Contractions  - 1 x daily - 7 x weekly - 2 sets - 15 reps - Seated Pelvic Floor Elevators With Lengthening  - 1 x daily - 7 x weekly - 2 sets - 5 reps - Sit to Stand with Resistance Around Legs  - 1 x daily - 7 x weekly - 2 sets - 10 reps - Seated Hip Abduction with Resistance  - 1 x daily - 7 x weekly - 2 sets - 15 reps - Seated March with Resistance  - 1 x daily - 7 x weekly - 2 sets - 10 reps - Hip Extension with Resistance Loop  - 1 x daily - 7 x weekly - 2 sets - 10 reps - Hip Abduction with Resistance Loop  - 1 x daily - 7 x weekly - 2 sets - 10 reps  ASSESSMENT:  CLINICAL IMPRESSION: Patient is a 84 y.o. female  who was seen today for physical therapy treatment for rectocele treatment. Today we did a progress assessment and pt found to have increased pelvic floor strength and  coordination. She started pelvic PT with a 3/5 manual muscle test score in the pelvic floor and she is very close to achieving a 4/5 score. She still demonstrates mild weakness and lack of coordination. We updated her goals to include leakage goal and bowel movement goal. Overall, patient tolerated evaluation well and Pt would benefit from additional PT to further address bowel inconsistency and urinary incontinence.    OBJECTIVE IMPAIRMENTS: decreased coordination, decreased endurance, decreased mobility, decreased ROM, decreased strength, and pain.   ACTIVITY LIMITATIONS: continence and toileting  PARTICIPATION LIMITATIONS: complete bowel movements   PERSONAL FACTORS: Age, Past/current experiences, and Time since onset of injury/illness/exacerbation are also affecting patient's functional outcome.   REHAB POTENTIAL: Good  CLINICAL DECISION MAKING: Stable/uncomplicated  EVALUATION COMPLEXITY: Low   GOALS: Goals reviewed with patient? Yes  SHORT TERM GOALS: Target date: 03/14/2024  Pt will be independent with HEP.  Baseline: Goal status: GOAL MET 05/03/24  2.  Pt will be able to correctly perform diaphragmatic breathing and appropriate pressure management in order to prevent worsening vaginal wall laxity and improve pelvic floor A/ROM.  Baseline:  Goal status: GOAL MET 05/03/24  3.  Pt will be independent with use of squatty potty, relaxed toileting mechanics, and improved bowel movement techniques in order to increase ease of bowel movements and complete evacuation.  Baseline:  Goal status: GOAL MET 05/03/24  LONG TERM GOALS: Target date: 08/14/2024  Pt will be independent with advanced HEP.  Baseline:  Goal status: ONGOING 05/03/24  2.  Pt to demonstrate improved coordination of pelvic floor and breathing mechanics with 10# squat with appropriate synergistic patterns to decrease rectal pressure at least 75% of the time for improved ability to complete a 30 minute workout with  strain at pelvic floor and symptoms.   Baseline:  Goal status: ONGOING 06/28/24  3.  Pt will report her BMs are complete due to improved bowel habits and evacuation techniques.  Baseline: doesn't feel like she is completely emptying  Goal status: ONGOING 06/28/24  4.  Pt will demonstrate 4/5 pelvic floor muscle strength with appropriate coordination in order to decrease urinary incontinence, urgency and frequency.   Baseline: 3 with improper coordination  Goal status:ONGOING 06/28/24  5. Patient will report decreased urinary incontinence symptoms with a sense of urgency to decrease urinary leakage and improve quality of life.   Baseline: will leak a few drops feeling like she cannot get ahead of it   Goal status: ONGOING 06/28/24  PLAN:  PT FREQUENCY: 1-2x/week  PT DURATION: other: 3 months   PLANNED INTERVENTIONS: 97110-Therapeutic exercises, 97530- Therapeutic activity, 97112- Neuromuscular re-education, 97535- Self Care, [id number]- Manual therapy, Patient/Family education, Taping, Joint mobilization, Spinal mobilization, Scar mobilization, Cryotherapy, and Moist heat  PLAN FOR NEXT SESSION: continued pelvic floor AROM training in seated and varying positions, introduce hip strengthening and core strengthening, gluteal strengthening, toileting mechanics for optimal emptying, re-coordination of toileting mechanics with defecation   Celena JAYSON Domino, PT 06/28/2024, 10:53 AM [facility] [address], [address] [place], [place] [address] Phone # [phone] Fax [phone]  "

## 2024-06-28 NOTE — Patient Instructions (Signed)
 Toileting Mechanics 101: Urination  Positioning: sit all the way down on the toilet, feet flat on the floor, trunk relaxed Hovering over the toilet seat can impact your ability to relax the pelvic floor musculature and empty your bladder well  When you initiate urination, try to let the urine flow out naturally rather than pushing down. Pushing can limit the ability of your urethral sphincter to relax and open. Double voiding technique: When you think you are done urinating, try gently pushing to see if there is any remaining urine that can be expelled   You can try the following techniques to see if there is any remaining urine that can be expelled: Rocking back and forth Leaning side to side  Twisting your trunk  Taking deep belly breaths to promote blood flow to pelvic floor musculature

## 2024-07-09 ENCOUNTER — Ambulatory Visit (HOSPITAL_COMMUNITY)

## 2024-08-03 ENCOUNTER — Ambulatory Visit: Admitting: Physical Therapy

## 2024-08-09 ENCOUNTER — Ambulatory Visit: Admitting: Physical Therapy

## 2024-08-16 ENCOUNTER — Ambulatory Visit: Admitting: Physical Therapy

## 2024-08-23 ENCOUNTER — Ambulatory Visit: Admitting: Physical Therapy
